# Patient Record
Sex: Male | Born: 1960 | Race: White | Hispanic: No | Marital: Married | Smoking: Former smoker
Health system: Southern US, Community
[De-identification: ages and names within clinical notes are randomized; demographics above are authoritative.]

## PROBLEM LIST (undated history)

## (undated) DIAGNOSIS — C20 Malignant neoplasm of rectum: Secondary | ICD-10-CM

## (undated) DIAGNOSIS — J309 Allergic rhinitis, unspecified: Secondary | ICD-10-CM

## (undated) DIAGNOSIS — R011 Cardiac murmur, unspecified: Secondary | ICD-10-CM

## (undated) HISTORY — DX: Cardiac murmur, unspecified: R01.1

## (undated) HISTORY — DX: Allergic rhinitis, unspecified: J30.9

## (undated) HISTORY — DX: Malignant neoplasm of rectum: C20

---

## 2019-11-29 ENCOUNTER — Telehealth: Payer: Self-pay | Admitting: Family Medicine

## 2019-11-29 ENCOUNTER — Other Ambulatory Visit: Payer: Self-pay

## 2019-11-29 ENCOUNTER — Encounter: Payer: Self-pay | Admitting: Family Medicine

## 2019-11-29 ENCOUNTER — Ambulatory Visit (INDEPENDENT_AMBULATORY_CARE_PROVIDER_SITE_OTHER): Payer: 59 | Admitting: Family Medicine

## 2019-11-29 VITALS — BP 134/83 | HR 73 | Temp 98.2°F | Ht 73.0 in | Wt 271.0 lb

## 2019-11-29 DIAGNOSIS — H0263 Xanthelasma of right eye, unspecified eyelid: Secondary | ICD-10-CM

## 2019-11-29 DIAGNOSIS — Z7689 Persons encountering health services in other specified circumstances: Secondary | ICD-10-CM

## 2019-11-29 DIAGNOSIS — R1030 Lower abdominal pain, unspecified: Secondary | ICD-10-CM

## 2019-11-29 DIAGNOSIS — J3089 Other allergic rhinitis: Secondary | ICD-10-CM

## 2019-11-29 DIAGNOSIS — H0266 Xanthelasma of left eye, unspecified eyelid: Secondary | ICD-10-CM

## 2019-11-29 MED ORDER — MONTELUKAST SODIUM 10 MG PO TABS
10.0000 mg | ORAL_TABLET | Freq: Every day | ORAL | 3 refills | Status: AC
Start: 2019-11-29 — End: ?

## 2019-11-29 MED ORDER — AZELASTINE HCL 0.1 % NA SOLN: 2.0000 | Freq: Two times a day (BID) | NASAL | 12 refills | Status: AC

## 2019-11-29 MED ORDER — DICYCLOMINE HCL 10 MG PO CAPS: 10.0000 mg | ORAL_CAPSULE | Freq: Three times a day (TID) | ORAL | 1 refills | Status: DC

## 2019-11-29 NOTE — Telephone Encounter (Signed)
Return call to patient and spoke to him. Patient stated that he just saw the Rx on my chart and he will go to pick them up from his pharmacy.

## 2019-11-29 NOTE — Telephone Encounter (Signed)
Copied from [place] [phone]. Topic: General - Inquiry >> Nov 29, 2019 11:27 AM Alease Frame wrote: Reason for CRM: patient was seen in the office today by Orene Desanctis. He wasn't prescribed any medication for GI and some other issues . He wanted a call back from lane's nurse . Please advise

## 2019-11-29 NOTE — Progress Notes (Signed)
BP 134/83   Pulse 73   Temp 98.2 F (36.8 C) (Oral)   Ht 6\' 1"  (1.854 m)   Wt 271 lb (122.9 kg)   SpO2 98%   BMI 35.75 kg/m    Subjective:    Patient ID: Stephen Vasquez, male    DOB: 12/10/1960, 59 y.o.   MRN: [id number]  HPI: Stephen Vasquez is a 59 y.o. male  Chief Complaint  Patient presents with  . Establish Care  . Gastroesophageal Reflux  . Sinusitis   Here today to establish care. Has not been to a doctor since about 1986 per patient. No known chronic medical problems and not currently taking any medications. Has several concerns.   Incredibly gassy and bloated, oily loose stools, bowel urgency during meals the past 2 years. Does occasionally have bloody stools but only happens when lifting something super heavy so thinks that's related. Has tried cutting out bread for a while and tried cutting back dairy both without any benefit. Denies frequent belching, upper GI burning/reflux sxs, unexpected weight loss, night sweats. Father had stomach cancer and he thinks paternal grandfather passed away from a related cancer.   Post nasal drainage, sinus pressure, hacking cough ongoing. Notes he struggles often with seasonal allergies. Clarinex is the only thing that worked well in the past but that it's very expensive so he doesn't use it anymore. States OTC flonase doesn't work nor do OTC antihistamines. .   Has changed diet quite a bit in the past few years after he was told the places coming in on eyes were xanthelasmas and he probably has high cholesterol. Has not had labs in many years to check on this or ever been on medication for it.   Relevant past medical, surgical, family and social history reviewed and updated as indicated. Interim medical history since our last visit reviewed. Allergies and medications reviewed and updated.  Review of Systems  Per HPI unless specifically indicated above     Objective:    BP 134/83   Pulse 73   Temp 98.2 F (36.8 C) (Oral)   Ht  6\' 1"  (1.854 m)   Wt 271 lb (122.9 kg)   SpO2 98%   BMI 35.75 kg/m   Wt Readings from Last 3 Encounters:  11/29/19 271 lb (122.9 kg)    Physical Exam Vitals and nursing note reviewed.  Constitutional:      Appearance: Normal appearance.  HENT:     Head: Atraumatic.     Right Ear: Tympanic membrane normal.     Left Ear: Tympanic membrane normal.     Nose:     Comments: Nasal mucosa erythematous and edematous b/l nares    Mouth/Throat:     Pharynx: Posterior oropharyngeal erythema present.  Eyes:     Extraocular Movements: Extraocular movements intact.     Conjunctiva/sclera: Conjunctivae normal.  Cardiovascular:     Rate and Rhythm: Normal rate and regular rhythm.  Pulmonary:     Effort: Pulmonary effort is normal.     Breath sounds: Normal breath sounds.  Abdominal:     General: Bowel sounds are normal. There is no distension.     Palpations: Abdomen is soft.     Tenderness: There is no abdominal tenderness. There is no guarding.  Musculoskeletal:        General: Normal range of motion.     Cervical back: Normal range of motion and neck supple.  Skin:    General: Skin is warm and dry.  Neurological:     General: No focal deficit present.     Mental Status: He is oriented to person, place, and time.  Psychiatric:        Mood and Affect: Mood normal.        Thought Content: Thought content normal.        Judgment: Judgment normal.     No results found for this or any previous visit.    Assessment & Plan:   Problem List Items Addressed This Visit      Respiratory   Allergic rhinitis    Will start singulair and astelin regimen as no benefit with antihistamines and flonase. May add antihistamine if desired. F/u if not improving. Supportive care reviewed       Other Visit Diagnoses    Lower abdominal pain    -  Primary   Referral placed to GI for eval and colonoscopy, trial bentyl and keeping food log in meantime to try to isolate triggers.    Relevant Orders     Ambulatory referral to Gastroenterology   Encounter to establish care       Xanthelasma of eyelid, bilateral       Will check full panel of labs at upcoming CPE. Continue good diet and exercise habits       Follow up plan: Return in about 2 weeks (around 12/13/2019) for CPE.

## 2019-11-30 DIAGNOSIS — J309 Allergic rhinitis, unspecified: Secondary | ICD-10-CM | POA: Insufficient documentation

## 2019-11-30 NOTE — Assessment & Plan Note (Signed)
Will start singulair and astelin regimen as no benefit with antihistamines and flonase. May add antihistamine if desired. F/u if not improving. Supportive care reviewed

## 2019-12-07 ENCOUNTER — Ambulatory Visit (INDEPENDENT_AMBULATORY_CARE_PROVIDER_SITE_OTHER): Payer: 59 | Admitting: Gastroenterology

## 2019-12-07 ENCOUNTER — Other Ambulatory Visit: Payer: Self-pay

## 2019-12-07 VITALS — BP 132/85 | HR 76 | Temp 98.1°F | Ht 73.0 in | Wt 274.4 lb

## 2019-12-07 DIAGNOSIS — K629 Disease of anus and rectum, unspecified: Secondary | ICD-10-CM

## 2019-12-07 DIAGNOSIS — R198 Other specified symptoms and signs involving the digestive system and abdomen: Secondary | ICD-10-CM

## 2019-12-07 DIAGNOSIS — K625 Hemorrhage of anus and rectum: Secondary | ICD-10-CM

## 2019-12-07 NOTE — Progress Notes (Addendum)
Stephen Bellows MD, MRCP(U.K) [address]  [place]  [place], [place] [address]  Main: [phone]  Fax: [phone]   Gastroenterology Consultation  Referring Provider:     Volney American,* Primary Care Physician:  Volney American, [place] Primary Gastroenterologist:  Dr. Jonathon Vasquez  Reason for Consultation:     Abdominal pain         HPI:   Stephen Vasquez is a 59 y.o. y/o male referred for consultation & management  by  Volney American, PA-C.    H/o  blood per rectum which is bright red in color at times even when not associated with the bowel movement.  Denies any perianal itching.  Some fecal seepage.  Never had a colonoscopy.  No hematochezia colon cancer or polyps.  Denies any NSAID use.  Denies any use of any blood thinners.  Denies any painful defecation.  Has a normal bowel movement daily.  Denies any constipation. No abdominal pain.    Past Medical History:  Diagnosis Date  . Heart murmur     No past surgical history on file.  Prior to Admission medications   Medication Sig Start Date End Date Taking? Authorizing Provider  azelastine (ASTELIN) 0.1 % nasal spray Place 2 sprays into both nostrils 2 (two) times daily. Use in each nostril as directed 11/29/19   Volney American, PA-C  dicyclomine (BENTYL) 10 MG capsule Take 1 capsule (10 mg total) by mouth 4 (four) times daily -  before meals and at bedtime. 11/29/19   Volney American, PA-C  montelukast (SINGULAIR) 10 MG tablet Take 1 tablet (10 mg total) by mouth at bedtime. 11/29/19   Volney American, PA-C    Family History  Problem Relation Age of Onset  . Hypertension Mother   . Hypertension Father   . Cancer Father   . Heart disease Brother      Social History   Tobacco Use  . Smoking status: Former Research scientist (life sciences)  . Smokeless tobacco: Never Used  Substance Use Topics  . Alcohol use: Not Currently  . Drug use: Never    Allergies as of 12/07/2019  . (No Known  Allergies)    Review of Systems:    All systems reviewed and negative except where noted in HPI.   Physical Exam:  There were no vitals taken for this visit. No LMP for male patient. Psych:  Alert and cooperative. Normal mood and affect. General:   Alert,  Well-developed, well-nourished, pleasant and cooperative in NAD Head:  Normocephalic and atraumatic. Eyes:  Sclera clear, no icterus.   Conjunctiva pink. Ears:  Normal auditory acuity. Lungs:  Respirations even and unlabored.  Clear throughout to auscultation.   No wheezes, crackles, or rhonchi. No acute distress. Heart:  Regular rate and rhythm; no murmurs, clicks, rubs, or gallops. Abdomen:  Normal bowel sounds.  No bruits.  Soft, non-tender and non-distended without masses, hepatosplenomegaly or hernias noted.  No guarding or rebound tenderness.    Neurologic:  Alert and oriented x3;  grossly normal neurologically. Psych:  Alert and cooperative. Normal mood and affect.  Imaging Studies: No results found.  Assessment and Plan:   Stephen Vasquez is a 62 y.o. y/o male has been referred for abdominal pain.  In fact his main issue is anal discomfort.  In addition has episodic rectal bleeding, seepage which can be seen with prolapsing hemorrhoids but is also associated with other conditions such as colonic polyps or neoplasm.  Plan  1. CBC,CMP  2.  Colonoscopy and rectal examination at the time of procedure.  Sample of bowel prep provided.  I have discussed alternative options, risks & benefits,  which include, but are not limited to, bleeding, infection, perforation,respiratory complication & drug reaction.  The patient agrees with this plan & written consent will be obtained.     Follow up in 8 weeks follow-up  Dr Stephen Bellows MD,MRCP(U.K)

## 2019-12-13 ENCOUNTER — Other Ambulatory Visit
Admission: RE | Admit: 2019-12-13 | Discharge: 2019-12-13 | Disposition: A | Discharge status: Home / Self Care | Payer: 59 | Source: Ambulatory Visit | Attending: Gastroenterology | Admitting: Gastroenterology

## 2019-12-13 DIAGNOSIS — Z20822 Contact with and (suspected) exposure to covid-19: Secondary | ICD-10-CM | POA: Insufficient documentation

## 2019-12-13 DIAGNOSIS — Z01812 Encounter for preprocedural laboratory examination: Secondary | ICD-10-CM | POA: Diagnosis present

## 2019-12-14 LAB — SARS CORONAVIRUS 2 (TAT 6-24 HRS): SARS Coronavirus 2: NEGATIVE

## 2019-12-15 ENCOUNTER — Ambulatory Visit: Payer: 59 | Admitting: Anesthesiology

## 2019-12-15 ENCOUNTER — Ambulatory Visit
Admission: RE | Admit: 2019-12-15 | Discharge: 2019-12-15 | Disposition: A | Discharge status: Home / Self Care | Payer: 59 | Attending: Gastroenterology | Admitting: Gastroenterology

## 2019-12-15 ENCOUNTER — Encounter: Payer: 59 | Admitting: Family Medicine

## 2019-12-15 ENCOUNTER — Encounter
Admission: RE | Disposition: A | Discharge status: Home / Self Care | Payer: Self-pay | Source: Home / Self Care | Attending: Gastroenterology

## 2019-12-15 ENCOUNTER — Encounter: Payer: Self-pay | Admitting: Gastroenterology

## 2019-12-15 DIAGNOSIS — C19 Malignant neoplasm of rectosigmoid junction: Secondary | ICD-10-CM | POA: Diagnosis not present

## 2019-12-15 DIAGNOSIS — K5669 Other partial intestinal obstruction: Secondary | ICD-10-CM | POA: Insufficient documentation

## 2019-12-15 DIAGNOSIS — K625 Hemorrhage of anus and rectum: Secondary | ICD-10-CM | POA: Insufficient documentation

## 2019-12-15 DIAGNOSIS — K6289 Other specified diseases of anus and rectum: Secondary | ICD-10-CM | POA: Diagnosis not present

## 2019-12-15 DIAGNOSIS — Z79899 Other long term (current) drug therapy: Secondary | ICD-10-CM | POA: Insufficient documentation

## 2019-12-15 DIAGNOSIS — Z87891 Personal history of nicotine dependence: Secondary | ICD-10-CM | POA: Diagnosis not present

## 2019-12-15 HISTORY — PX: COLONOSCOPY WITH PROPOFOL: SHX5780

## 2019-12-15 SURGERY — COLONOSCOPY WITH PROPOFOL
Anesthesia: General

## 2019-12-15 MED ORDER — PROPOFOL 500 MG/50ML IV EMUL
INTRAVENOUS | Status: DC | PRN
  Administered 2019-12-15: 150 ug/kg/min via INTRAVENOUS

## 2019-12-15 MED ORDER — LIDOCAINE HCL (CARDIAC) PF 100 MG/5ML IV SOSY
PREFILLED_SYRINGE | INTRAVENOUS | Status: DC | PRN
  Administered 2019-12-15: 50 mg via INTRAVENOUS

## 2019-12-15 MED ORDER — PROPOFOL 10 MG/ML IV BOLUS
INTRAVENOUS | Status: DC | PRN
  Administered 2019-12-15: 90 mg via INTRAVENOUS

## 2019-12-15 MED ORDER — PROPOFOL 500 MG/50ML IV EMUL
INTRAVENOUS | Status: AC
  Filled 2019-12-15: qty 50

## 2019-12-15 MED ORDER — SODIUM CHLORIDE 0.9 % IV SOLN
INTRAVENOUS | Status: DC
  Administered 2019-12-15: 1000 mL via INTRAVENOUS

## 2019-12-15 NOTE — Anesthesia Preprocedure Evaluation (Signed)
Anesthesia Evaluation  Patient identified by MRN, date of birth, ID band Patient awake    Reviewed: Allergy & Precautions, H&P , NPO status , Patient's Chart, lab work & pertinent test results  History of Anesthesia Complications Negative for: history of anesthetic complications  Airway Mallampati: II  TM Distance: >3 FB Neck ROM: full    Dental  (+) Chipped   Pulmonary neg pulmonary ROS, neg shortness of breath, former smoker,    Pulmonary exam normal        Cardiovascular Exercise Tolerance: Good (-) angina(-) Past MI and (-) DOE Normal cardiovascular exam+ Valvular Problems/Murmurs      Neuro/Psych negative neurological ROS  negative psych ROS   GI/Hepatic negative GI ROS, Neg liver ROS, neg GERD  ,  Endo/Other  negative endocrine ROS  Renal/GU negative Renal ROS  negative genitourinary   Musculoskeletal   Abdominal   Peds  Hematology negative hematology ROS (+)   Anesthesia Other Findings Past Medical History: No date: Heart murmur  History reviewed. No pertinent surgical history.  BMI    Body Mass Index: 35.18 kg/m      Reproductive/Obstetrics negative OB ROS                             Anesthesia Physical Anesthesia Plan  ASA: II  Anesthesia Plan: General   Post-op Pain Management:    Induction: Intravenous  PONV Risk Score and Plan: Propofol infusion and TIVA  Airway Management Planned: Natural Airway and Nasal Cannula  Additional Equipment:   Intra-op Plan:   Post-operative Plan:   Informed Consent: I have reviewed the patients History and Physical, chart, labs and discussed the procedure including the risks, benefits and alternatives for the proposed anesthesia with the patient or authorized representative who has indicated his/her understanding and acceptance.     Dental Advisory Given  Plan Discussed with: Anesthesiologist, CRNA and  Surgeon  Anesthesia Plan Comments: (Patient consented for risks of anesthesia including but not limited to:  - adverse reactions to medications - risk of intubation if required - damage to eyes, teeth, lips or other oral mucosa - nerve damage due to positioning  - sore throat or hoarseness - Damage to heart, brain, nerves, lungs or loss of life  Patient voiced understanding.)        Anesthesia Quick Evaluation

## 2019-12-15 NOTE — Op Note (Addendum)
[facility] Gastroenterology Patient Name: Stephen Vasquez Procedure Date: 12/15/2019 9:27 AM MRN: [id number] Account #: [id number] Date of Birth: February 03, 1961 Admit Type: Inpatient Age: 59 Room: [facility] ENDO ROOM 1 Gender: Male Note Status: Finalized Procedure:             Colonoscopy Indications:           Rectal bleeding Providers:             [name] MD, MD Medicines:             Monitored Anesthesia Care Complications:         No immediate complications. Procedure:             Pre-Anesthesia Assessment:                        - Prior to the procedure, a History and Physical was                         performed, and patient medications, allergies and                         sensitivities were reviewed. The patient's tolerance                         of previous anesthesia was reviewed.                        - The risks and benefits of the procedure and the                         sedation options and risks were discussed with the                         patient. All questions were answered and informed                         consent was obtained.                        - ASA Grade Assessment: II - A patient with mild                         systemic disease.                        After obtaining informed consent, the colonoscope was                         passed under direct vision. Throughout the procedure,                         the patient's blood pressure, pulse, and oxygen                         saturations were monitored continuously. The                         Colonoscope was introduced through the anus and  advanced to the the cecum, identified by the                         appendiceal orifice. The colonoscopy was performed                         with ease. The patient tolerated the procedure well.                         The quality of the bowel preparation was fair. Findings:      The digital rectal exam findings  include palpable rectal mass.      An ulcerated partially obstructing large mass was found in the rectum       extending all the way from the anal verge and then proximally( seen       clearly on retroflexed views - see pics). The mass was circumferential.       Oozing was present. This was biopsied with a cold forceps for histology.      The exam was otherwise without abnormality. Impression:            - Preparation of the colon was fair.                        - Palpable rectal mass found on digital rectal exam.                        - Rule out malignancy, partially obstructing tumor in                         the rectum. Biopsied.                        - The examination was otherwise normal. Recommendation:        - Discharge patient to home (with escort).                        - Resume previous diet.                        - Continue present medications.                        - Await pathology results.                        - Return to my office in 5 days. Procedure Code(s):     --- Professional ---                        [phone], Colonoscopy, flexible; with biopsy, single or                         multiple Diagnosis Code(s):     --- Professional ---                        K62.89, Other specified diseases of anus and rectum                        D49.0, Neoplasm of unspecified behavior of digestive  system                        K56.690, Other partial intestinal obstruction                        K62.5, Hemorrhage of anus and rectum CPT copyright 2019 American Medical Association. All rights reserved. The codes documented in this report are preliminary and upon coder review may  be revised to meet current compliance requirements. [name], MD [name] MD, MD 12/15/2019 2:08:13 PM This report has been signed electronically. Number of Addenda: 0 Note Initiated On: 12/15/2019 9:27 AM Scope Withdrawal Time: 0 hours 9 minutes 19 seconds  Total Procedure  Duration: 0 hours 14 minutes 2 seconds  Estimated Blood Loss:  Estimated blood loss: none.      [facility]

## 2019-12-15 NOTE — Anesthesia Procedure Notes (Signed)
Date/Time: 12/15/2019 1:40 PM Performed by: Johnna Acosta, CRNA Pre-anesthesia Checklist: Patient identified, Emergency Drugs available, Suction available, Patient being monitored and Timeout performed Patient Re-evaluated:Patient Re-evaluated prior to induction Oxygen Delivery Method: Nasal cannula Preoxygenation: Pre-oxygenation with 100% oxygen Induction Type: IV induction

## 2019-12-15 NOTE — H&P (Signed)
[name], MD [address] [place], [place], [place], [address] [address], [place], [place], [place], [address] Phone: [phone]  Fax: [phone]  Primary Care Physician:  Volney American, PA-C   Pre-Procedure History & Physical: HPI:  Stephen Vasquez is a 59 y.o. male is here for an colonoscopy.   Past Medical History:  Diagnosis Date  . Heart murmur     History reviewed. No pertinent surgical history.  Prior to Admission medications   Medication Sig Start Date End Date Taking? Authorizing Provider  azelastine (ASTELIN) 0.1 % nasal spray Place 2 sprays into both nostrils 2 (two) times daily. Use in each nostril as directed 11/29/19  Yes Volney American, PA-C  dicyclomine (BENTYL) 10 MG capsule Take 1 capsule (10 mg total) by mouth 4 (four) times daily -  before meals and at bedtime. 11/29/19  Yes Volney American, PA-C  montelukast (SINGULAIR) 10 MG tablet Take 1 tablet (10 mg total) by mouth at bedtime. 11/29/19  Yes Volney American, PA-C    Allergies as of 12/08/2019  . (No Known Allergies)    Family History  Problem Relation Age of Onset  . Hypertension Mother   . Hypertension Father   . Cancer Father   . Heart disease Brother     Social History   Socioeconomic History  . Marital status: Single    Spouse name: Not on file  . Number of children: Not on file  . Years of education: Not on file  . Highest education level: Not on file  Occupational History  . Not on file  Tobacco Use  . Smoking status: Former Research scientist (life sciences)  . Smokeless tobacco: Never Used  Substance and Sexual Activity  . Alcohol use: Not Currently  . Drug use: Never  . Sexual activity: Yes  Other Topics Concern  . Not on file  Social History Narrative  . Not on file   Social Determinants of Health   Financial Resource Strain:   . Difficulty of Paying Living Expenses:   Food Insecurity:   . Worried About Charity fundraiser in the Last Year:   . Arts development officer in the Last Year:   Transportation Needs:   . Film/video editor (Medical):   Marland Kitchen Lack of Transportation (Non-Medical):   Physical Activity:   . Days of Exercise per Week:   . Minutes of Exercise per Session:   Stress:   . Feeling of Stress :   Social Connections:   . Frequency of Communication with Friends and Family:   . Frequency of Social Gatherings with Friends and Family:   . Attends Religious Services:   . Active Member of Clubs or Organizations:   . Attends Archivist Meetings:   Marland Kitchen Marital Status:   Intimate Partner Violence:   . Fear of Current or Ex-Partner:   . Emotionally Abused:   Marland Kitchen Physically Abused:   . Sexually Abused:     Review of Systems: See HPI, otherwise negative ROS  Physical Exam: BP (!) 151/94   Pulse 77   Temp (!) 97.1 F (36.2 C) (Temporal)   Resp 17   Ht 6\' 2"  (1.88 m)   Wt 124.3 kg   SpO2 100%   BMI 35.18 kg/m  General:   Alert,  pleasant and cooperative in NAD Head:  Normocephalic and atraumatic. Neck:  Supple; no masses or thyromegaly. Lungs:  Clear throughout to auscultation, normal respiratory effort.  Heart:  +S1, +S2, Regular rate and rhythm, No edema. Abdomen:  Soft, nontender and nondistended. Normal bowel sounds, without guarding, and without rebound.   Neurologic:  Alert and  oriented x4;  grossly normal neurologically.  Impression/Plan: Stephen Vasquez is here for an colonoscopy to be performed for rectal bleeding Risks, benefits, limitations, and alternatives regarding  colonoscopy have been reviewed with the patient.  Questions have been answered.  All parties agreeable.   [name], MD  12/15/2019, 1:01 PM

## 2019-12-15 NOTE — Transfer of Care (Signed)
Immediate Anesthesia Transfer of Care Note  Patient: Stephen Vasquez  Procedure(s) Performed: COLONOSCOPY WITH PROPOFOL (N/A )  Patient Location: PACU  Anesthesia Type:General  Level of Consciousness: sedated  Airway & Oxygen Therapy: Patient Spontanous Breathing  Post-op Assessment: Report given to RN and Post -op Vital signs reviewed and stable  Post vital signs: Reviewed and stable  Last Vitals:  Vitals Value Taken Time  BP 100/69 12/15/19 1404  Temp 35.6 C 12/15/19 1402  Pulse 75 12/15/19 1404  Resp 22 12/15/19 1404  SpO2 97 % 12/15/19 1404    Last Pain:  Vitals:   12/15/19 1402  TempSrc: Temporal  PainSc: Asleep         Complications: No apparent anesthesia complications

## 2019-12-15 NOTE — Anesthesia Postprocedure Evaluation (Signed)
Anesthesia Post Note  Patient: Stephen Vasquez  Procedure(s) Performed: COLONOSCOPY WITH PROPOFOL (N/A )  Patient location during evaluation: Endoscopy Anesthesia Type: General Level of consciousness: awake and alert Pain management: pain level controlled Vital Signs Assessment: post-procedure vital signs reviewed and stable Respiratory status: spontaneous breathing, nonlabored ventilation, respiratory function stable and patient connected to nasal cannula oxygen Cardiovascular status: blood pressure returned to baseline and stable Postop Assessment: no apparent nausea or vomiting Anesthetic complications: no     Last Vitals:  Vitals:   12/15/19 1412 12/15/19 1422  BP: 123/73 120/67  Pulse:    Resp:    Temp:    SpO2:      Last Pain:  Vitals:   12/15/19 1422  TempSrc:   PainSc: 0-No pain                 Precious Haws Holliday Sheaffer

## 2019-12-16 ENCOUNTER — Other Ambulatory Visit: Payer: Self-pay

## 2019-12-16 ENCOUNTER — Ambulatory Visit (INDEPENDENT_AMBULATORY_CARE_PROVIDER_SITE_OTHER): Payer: 59 | Admitting: Gastroenterology

## 2019-12-16 ENCOUNTER — Encounter: Payer: Self-pay | Admitting: *Deleted

## 2019-12-16 ENCOUNTER — Other Ambulatory Visit: Payer: Self-pay | Admitting: Pathology

## 2019-12-16 VITALS — BP 138/77 | HR 69 | Temp 98.2°F | Ht 73.0 in | Wt 271.8 lb

## 2019-12-16 DIAGNOSIS — C20 Malignant neoplasm of rectum: Secondary | ICD-10-CM | POA: Diagnosis not present

## 2019-12-16 LAB — SURGICAL PATHOLOGY

## 2019-12-16 NOTE — Progress Notes (Signed)
   [name] MD, MRCP(U.K) [address]  [place]  [place], [place] [address]  Main: [phone]  Fax: [phone]   Primary Care Physician: Volney American, [place]  Primary Gastroenterologist:  Dr. [name]   Discuss biopsy results   HPI: Stephen Vasquez is a 59 y.o. male   Summary of history :  He was initially referred and seen on Dec 07, 2019 for abdominal pain whereas in fact he complained of blood per rectum associated with bowel movements as well as at times but not associated with bowel movements.  Also had fecal seepage.  Never had a prior colonoscopy.  Interval history   Dec 07, 2019-Dec 16, 2019  Dec 15, 2019: Colonoscopy:An ulcerated partially obstructing large mass was found in the rectum extending all the way from the anal verge and then proximally( seen clearly on retroflexed views - see pics). The mass was circumferential.  Oozing was present. The exam was otherwise without abnormality.  Biopsies demonstrated invasive colorectal adenocarcinoma, moderately differentiated.  I have brought him in here to discuss the results.  Current Outpatient Medications  Medication Sig Dispense Refill  . azelastine (ASTELIN) 0.1 % nasal spray Place 2 sprays into both nostrils 2 (two) times daily. Use in each nostril as directed 30 mL 12  . dicyclomine (BENTYL) 10 MG capsule Take 1 capsule (10 mg total) by mouth 4 (four) times daily -  before meals and at bedtime. 120 capsule 1  . montelukast (SINGULAIR) 10 MG tablet Take 1 tablet (10 mg total) by mouth at bedtime. 30 tablet 3   No current facility-administered medications for this visit.    Allergies as of 12/16/2019  . (No Known Allergies)    ROS:  General: Negative for anorexia, weight loss, fever, chills, fatigue, weakness. ENT: Negative for hoarseness, difficulty swallowing , nasal congestion. CV: Negative for chest pain, angina, palpitations, dyspnea on exertion, peripheral edema.  Respiratory: Negative  for dyspnea at rest, dyspnea on exertion, cough, sputum, wheezing.  GI: See history of present illness. GU:  Negative for dysuria, hematuria, urinary incontinence, urinary frequency, nocturnal urination.  Endo: Negative for unusual weight change.    Physical Examination:   There were no vitals taken for this visit.  General: Well-nourished, well-developed in no acute distress.  Eyes: No icterus. Conjunctivae pink. Psych: Alert and cooperative, normal mood and affect.   Imaging Studies: No results found.  Assessment and Plan:   Stephen Vasquez is a 84 y.o. y/o male is here today to discuss the results of his colonoscopy that he underwent yesterday.  Indication was bright red blood per rectum as well as fecal seepage.  Per rectal exam there was a mass palpable digitally in the distal rectum and endoscopically partially obstructing circumferential mass extending all the way to the anal canal.  Friable, bleeding on contact.  Biopsies demonstrate moderately differentiated adenocarcinoma.  Plan 1.  Refer to Dr. Rogue Bussing in oncology.  I have discussed in person and will order CT scan of the chest abdomen and pelvis for staging, CEA.  I have explained that the next steps will be staging of the cancer followed by discussion of chemoradiation therapy.  Likely appointment early next week    Dr [name]  MD,MRCP [facility]) Follow up as needed

## 2019-12-16 NOTE — Addendum Note (Signed)
Addended by: Dorethea Clan on: 12/16/2019 02:30 PM   Modules accepted: Orders

## 2019-12-16 NOTE — Addendum Note (Signed)
Addended by: Dorethea Clan on: 12/16/2019 01:37 PM   Modules accepted: Orders

## 2019-12-17 ENCOUNTER — Ambulatory Visit
Admission: RE | Admit: 2019-12-17 | Discharge: 2019-12-17 | Disposition: A | Discharge status: Home / Self Care | Payer: 59 | Source: Ambulatory Visit | Attending: Gastroenterology | Admitting: Gastroenterology

## 2019-12-17 ENCOUNTER — Encounter: Payer: Self-pay | Admitting: Internal Medicine

## 2019-12-17 ENCOUNTER — Other Ambulatory Visit: Payer: Self-pay

## 2019-12-17 DIAGNOSIS — C20 Malignant neoplasm of rectum: Secondary | ICD-10-CM | POA: Insufficient documentation

## 2019-12-17 LAB — CEA: CEA: 94.1 ng/mL — ABNORMAL HIGH (ref 0.0–4.7)

## 2019-12-17 MED ORDER — IOHEXOL 300 MG/ML  SOLN
100.0000 mL | Freq: Once | INTRAMUSCULAR | Status: AC | PRN
  Administered 2019-12-17: 100 mL via INTRAVENOUS

## 2019-12-20 ENCOUNTER — Encounter: Payer: 59 | Admitting: Family Medicine

## 2019-12-20 ENCOUNTER — Ambulatory Visit: Payer: 59

## 2019-12-20 ENCOUNTER — Inpatient Hospital Stay: Payer: 59 | Attending: Internal Medicine | Admitting: Internal Medicine

## 2019-12-20 ENCOUNTER — Encounter: Payer: Self-pay | Admitting: Internal Medicine

## 2019-12-20 ENCOUNTER — Other Ambulatory Visit: Payer: Self-pay

## 2019-12-20 DIAGNOSIS — Z800 Family history of malignant neoplasm of digestive organs: Secondary | ICD-10-CM

## 2019-12-20 DIAGNOSIS — D500 Iron deficiency anemia secondary to blood loss (chronic): Secondary | ICD-10-CM | POA: Diagnosis not present

## 2019-12-20 DIAGNOSIS — C20 Malignant neoplasm of rectum: Secondary | ICD-10-CM | POA: Diagnosis not present

## 2019-12-20 DIAGNOSIS — R599 Enlarged lymph nodes, unspecified: Secondary | ICD-10-CM | POA: Diagnosis not present

## 2019-12-20 DIAGNOSIS — K921 Melena: Secondary | ICD-10-CM | POA: Diagnosis not present

## 2019-12-20 DIAGNOSIS — Z87891 Personal history of nicotine dependence: Secondary | ICD-10-CM | POA: Insufficient documentation

## 2019-12-20 LAB — CBC WITH DIFFERENTIAL/PLATELET
Abs Immature Granulocytes: 0.03 10*3/uL (ref 0.00–0.07)
Basophils Absolute: 0.1 10*3/uL (ref 0.0–0.1)
Basophils Relative: 1 %
Eosinophils Absolute: 0.2 10*3/uL (ref 0.0–0.5)
Eosinophils Relative: 3 %
HCT: 35.3 % — ABNORMAL LOW (ref 39.0–52.0)
Hemoglobin: 10.8 g/dL — ABNORMAL LOW (ref 13.0–17.0)
Immature Granulocytes: 0 %
Lymphocytes Relative: 25 %
Lymphs Abs: 1.9 10*3/uL (ref 0.7–4.0)
MCH: 23.2 pg — ABNORMAL LOW (ref 26.0–34.0)
MCHC: 30.6 g/dL (ref 30.0–36.0)
MCV: 75.9 fL — ABNORMAL LOW (ref 80.0–100.0)
Monocytes Absolute: 0.5 10*3/uL (ref 0.1–1.0)
Monocytes Relative: 7 %
Neutro Abs: 4.9 10*3/uL (ref 1.7–7.7)
Neutrophils Relative %: 64 %
Platelets: 367 10*3/uL (ref 150–400)
RBC: 4.65 MIL/uL (ref 4.22–5.81)
RDW: 14.6 % (ref 11.5–15.5)
WBC: 7.7 10*3/uL (ref 4.0–10.5)
nRBC: 0 % (ref 0.0–0.2)

## 2019-12-20 LAB — COMPREHENSIVE METABOLIC PANEL
ALT: 13 U/L (ref 0–44)
AST: 15 U/L (ref 15–41)
Albumin: 3.7 g/dL (ref 3.5–5.0)
Alkaline Phosphatase: 83 U/L (ref 38–126)
Anion gap: 7 (ref 5–15)
BUN: 20 mg/dL (ref 6–20)
CO2: 28 mmol/L (ref 22–32)
Calcium: 9.1 mg/dL (ref 8.9–10.3)
Chloride: 107 mmol/L (ref 98–111)
Creatinine, Ser: 1.08 mg/dL (ref 0.61–1.24)
GFR calc Af Amer: >60 mL/min (ref >60)
GFR calc non Af Amer: >60 mL/min (ref >60)
Glucose, Bld: 103 mg/dL — ABNORMAL HIGH (ref 70–99)
Potassium: 4.4 mmol/L (ref 3.5–5.1)
Sodium: 142 mmol/L (ref 135–145)
Total Bilirubin: 0.4 mg/dL (ref 0.3–1.2)
Total Protein: 7.6 g/dL (ref 6.5–8.1)

## 2019-12-20 MED ORDER — ONDANSETRON HCL 8 MG PO TABS
ORAL_TABLET | ORAL | 1 refills | Status: DC
Start: 2019-12-20 — End: 2020-09-06

## 2019-12-20 MED ORDER — PROCHLORPERAZINE MALEATE 10 MG PO TABS
10.0000 mg | ORAL_TABLET | Freq: Four times a day (QID) | ORAL | 1 refills | Status: DC | PRN
Start: 2019-12-20 — End: 2020-09-06

## 2019-12-20 MED ORDER — LIDOCAINE-PRILOCAINE 2.5-2.5 % EX CREA
1.0000 | TOPICAL_CREAM | CUTANEOUS | 0 refills | Status: DC | PRN
Start: 2019-12-20 — End: 2020-05-08

## 2019-12-20 NOTE — Progress Notes (Signed)
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 CEA- 48. CT C/A/P- Large mass involving the rectum is identified compatible with primary rectal carcinoma. This has a large, 6.7 cm exophytic component in the left pelvis. Multiple perirectal lymph nodes are identified compatible with metastatic adenopathy; Borderline enlarged aortocaval node measures 0.9 cm  #   # SURVIVORSHIP:   # GENETICS:   DIAGNOSIS: RECTAL CA  STAGE:   III     ;  GOALS: cure  CURRENT/MOST RECENT THERAPY : FOLFOX   Rectal cancer ([place])  12/20/2019 Initial Diagnosis   Rectal cancer ([facility])      HISTORY OF PRESENTING ILLNESS:  Stephen Vasquez 59 y.o.  male with no significant past medical history has been referred to [place] for further evaluation recommendations for newly diagnosed rectal cancer.  Patient states that he had been having blood in stools on and off for the last 2 years.  Patient denies any stool incontinence.  Does admit to soft stool; however denies any diarrhea.  Denies any constipation.  Denies weight loss.  No nausea no vomiting abdominal pain.   Review of Systems  Constitutional: Negative for chills, diaphoresis, fever, malaise/fatigue and weight loss.  HENT: Negative for nosebleeds and sore throat.   Eyes: Negative for double vision.  Respiratory: Negative for cough, hemoptysis, sputum production, shortness of breath and wheezing.   Cardiovascular: Negative for chest pain, palpitations, orthopnea and leg swelling.  Gastrointestinal: Positive for blood in stool. Negative for abdominal pain, constipation, diarrhea, heartburn, melena, nausea and vomiting.   Genitourinary: Negative for dysuria, frequency and urgency.  Musculoskeletal: Positive for joint pain. Negative for back pain.  Skin: Negative.  Negative for itching and rash.  Neurological: Negative for dizziness, tingling, focal weakness, weakness and headaches.  Endo/Heme/Allergies: Does not bruise/bleed easily.  Psychiatric/Behavioral: Negative for depression. The patient is not nervous/anxious and does not have insomnia.      MEDICAL HISTORY:  Past Medical History:  Diagnosis Date  . Allergic rhinitis   . Heart murmur   . Rectal cancer [facility])     SURGICAL HISTORY: Past Surgical History:  Procedure Laterality Date  . COLONOSCOPY WITH PROPOFOL N/A 12/15/2019   Procedure: COLONOSCOPY WITH PROPOFOL;  Surgeon: [name], MD;  Location[facility] ENDOSCOPY;  Service: Gastroenterology;  Laterality: N/A;    SOCIAL HISTORY: Social History   Socioeconomic History  . Marital status: Single    Spouse name: Not on file  . Number of children: Not on file  . Years of education: Not on file  . Highest education level: Not on file  Occupational History  . Not on file  Tobacco Use  . Smoking status: Former Research scientist (life sciences)  . Smokeless tobacco: Never Used  Substance and Sexual Activity  . Alcohol use: Not Currently  . Drug use: Never  . Sexual activity: Yes  Other Topics Concern  . Not on file  Social History Narrative   Lives in [place]; Freight forwarder at [employer]; 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 2 children- in boy and girls in late 7s.    Social Determinants of Health   Financial Resource Strain:   . Difficulty of Paying Living Expenses:   Food Insecurity:   . Worried About Charity fundraiser in  the Last Year:   . [place] in the Last Year:   Transportation Needs:   . Film/video editor (Medical):   Marland Kitchen Lack of Transportation (Non-Medical):   Physical Activity:   . Days of Exercise per Week:   . Minutes of Exercise per Session:   Stress:   . Feeling of Stress :    Social Connections:   . Frequency of Communication with Friends and Family:   . Frequency of Social Gatherings with Friends and Family:   . Attends Religious Services:   . Active Member of Clubs or Organizations:   . Attends Archivist Meetings:   Marland Kitchen Marital Status:   Intimate Partner Violence:   . Fear of Current or Ex-Partner:   . Emotionally Abused:   Marland Kitchen Physically Abused:   . Sexually Abused:     FAMILY HISTORY: Family History  Problem Relation Age of Onset  . Hypertension Mother   . Hypertension Father   . Stomach cancer Father        in 18s- survived.   Marland Kitchen Heart disease Brother     ALLERGIES:  has No Known Allergies.  MEDICATIONS:  Current Outpatient Medications  Medication Sig Dispense Refill  . azelastine (ASTELIN) 0.1 % nasal spray Place 2 sprays into both nostrils 2 (two) times daily. Use in each nostril as directed 30 mL 12  . dicyclomine (BENTYL) 10 MG capsule Take 1 capsule (10 mg total) by mouth 4 (four) times daily -  before meals and at bedtime. 120 capsule 1  . montelukast (SINGULAIR) 10 MG tablet Take 1 tablet (10 mg total) by mouth at bedtime. 30 tablet 3   No current facility-administered medications for this visit.      Marland Kitchen  PHYSICAL EXAMINATION: ECOG PERFORMANCE STATUS: 1 - Symptomatic but completely ambulatory  Vitals:   12/20/19 0903  BP: 129/80  Pulse: (!) 58  Resp: 20  Temp: (!) 95.5 F (35.3 C)  SpO2: 100%   Filed Weights   12/20/19 0903  Weight: 272 lb 3.2 oz (123.5 kg)    Physical Exam  Constitutional: He is oriented to person, place, and time and well-developed, well-nourished, and in no distress.  HENT:  Head: Normocephalic and atraumatic.  Mouth/Throat: Oropharynx is clear and moist. No oropharyngeal exudate.  Eyes: Pupils are equal, round, and reactive to light.  Cardiovascular: Normal rate and regular rhythm.  Pulmonary/Chest: Effort normal and breath sounds normal. No respiratory distress. He has no wheezes.   Abdominal: Soft. Bowel sounds are normal. He exhibits no distension and no mass. There is no abdominal tenderness. There is no rebound and no guarding.  Musculoskeletal:        General: No tenderness or edema. Normal range of motion.     Cervical back: Normal range of motion and neck supple.  Neurological: He is alert and oriented to person, place, and time.  Skin: Skin is warm.  Psychiatric: Affect normal.     LABORATORY DATA:  I have reviewed the data as listed Lab Results  Component Value Date   WBC 7.7 12/20/2019   HGB 10.8 (L) 12/20/2019   HCT 35.3 (L) 12/20/2019   MCV 75.9 (L) 12/20/2019   PLT 367 12/20/2019   Recent Labs    12/20/19 0952  NA 142  K 4.4  CL 107  CO2 28  GLUCOSE 103*  BUN 20  CREATININE 1.08  CALCIUM 9.1  GFRNONAA >60  GFRAA >60  PROT 7.6  ALBUMIN 3.7  AST  15  ALT 13  ALKPHOS 83  BILITOT 0.4    RADIOGRAPHIC STUDIES: I have personally reviewed the radiological images as listed and agreed with the findings in the report. CT CHEST W CONTRAST  Result Date: 12/17/2019 CLINICAL DATA:  Colorectal carcinoma staging. EXAM: CT CHEST, ABDOMEN, AND PELVIS WITH CONTRAST TECHNIQUE: Multidetector CT imaging of the chest, abdomen and pelvis was performed following the standard protocol during bolus administration of intravenous contrast. CONTRAST:  197mL OMNIPAQUE IOHEXOL 300 MG/ML  SOLN COMPARISON:  None. FINDINGS: CT CHEST FINDINGS Cardiovascular: The heart size appears within normal limits. Aortic atherosclerosis. Lad, left circumflex coronary artery calcifications. Mediastinum/Nodes: No enlarged mediastinal, hilar, or axillary lymph nodes. Thyroid gland, trachea, and esophagus demonstrate no significant findings. Lungs/Pleura: There is no pleural effusion identified. No airspace consolidation, atelectasis, or pneumothorax. No suspicious lung nodules. Musculoskeletal: No chest wall mass or suspicious bone lesions identified. CT ABDOMEN PELVIS FINDINGS  Hepatobiliary: No focal liver abnormality is seen. No gallstones, gallbladder wall thickening, or biliary dilatation. Pancreas: Unremarkable. No pancreatic ductal dilatation or surrounding inflammatory changes. Spleen: Normal in size without focal abnormality. Adrenals/Urinary Tract: Normal appearance of the adrenal glands. The kidneys are unremarkable. No mass or hydronephrosis. Urinary bladder is unremarkable. Stomach/Bowel: Stomach is within normal limits. Appendix appears normal. No evidence of small bowel wall thickening, distention, or inflammatory changes. There is a large mass within the lower anatomic pelvis involving the rectum. This measures 8.8 x 8.7 by 10.3 cm. This has a large, 6.7 cm exophytic component in the left pelvis, image 121/2. Vascular/Lymphatic: Aortic atherosclerosis. Borderline enlarged aortocaval node measures 0.9 cm, image 84/2. No pathologic adenopathy noted within the abdomen however prominent left external iliac node measures 7 mm. Multiple perirectal lymph nodes are identified. The largest is in the left posterior pelvis measuring 1.1 cm, image 117/2. Reproductive: Prostate is unremarkable. Other: Small bilateral fat containing inguinal hernias. No ascites. No focal fluid collections Musculoskeletal: No acute or significant osseous findings. IMPRESSION: 1. Large mass involving the rectum is identified compatible with primary rectal carcinoma. This has a large, 6.7 cm exophytic component in the left pelvis. Multiple perirectal lymph nodes are identified compatible with metastatic adenopathy. 2. Borderline enlarged aortocaval node measures 0.9 cm. No pathologic adenopathy noted within the abdomen however. Multi vessel coronary artery calcifications noted. 3. Aortic atherosclerosis, in addition to lad and left circumflex coronary artery disease. Please note that although the presence of coronary artery calcium documents the presence of coronary artery disease, the severity of this  disease and any potential stenosis cannot be assessed on this non-gated CT examination. Assessment for potential risk factor modification, dietary therapy or pharmacologic therapy may be warranted, if clinically indicated. Aortic Atherosclerosis (ICD10-I70.0). Electronically Signed   By: Kerby Moors M.D.   On: 12/17/2019 17:47   CT Abdomen Pelvis W Contrast  Result Date: 12/17/2019 CLINICAL DATA:  Colorectal carcinoma staging. EXAM: CT CHEST, ABDOMEN, AND PELVIS WITH CONTRAST TECHNIQUE: Multidetector CT imaging of the chest, abdomen and pelvis was performed following the standard protocol during bolus administration of intravenous contrast. CONTRAST:  170mL OMNIPAQUE IOHEXOL 300 MG/ML  SOLN COMPARISON:  None. FINDINGS: CT CHEST FINDINGS Cardiovascular: The heart size appears within normal limits. Aortic atherosclerosis. Lad, left circumflex coronary artery calcifications. Mediastinum/Nodes: No enlarged mediastinal, hilar, or axillary lymph nodes. Thyroid gland, trachea, and esophagus demonstrate no significant findings. Lungs/Pleura: There is no pleural effusion identified. No airspace consolidation, atelectasis, or pneumothorax. No suspicious lung nodules. Musculoskeletal: No chest wall mass or suspicious bone lesions identified.  CT ABDOMEN PELVIS FINDINGS Hepatobiliary: No focal liver abnormality is seen. No gallstones, gallbladder wall thickening, or biliary dilatation. Pancreas: Unremarkable. No pancreatic ductal dilatation or surrounding inflammatory changes. Spleen: Normal in size without focal abnormality. Adrenals/Urinary Tract: Normal appearance of the adrenal glands. The kidneys are unremarkable. No mass or hydronephrosis. Urinary bladder is unremarkable. Stomach/Bowel: Stomach is within normal limits. Appendix appears normal. No evidence of small bowel wall thickening, distention, or inflammatory changes. There is a large mass within the lower anatomic pelvis involving the rectum. This measures 8.8  x 8.7 by 10.3 cm. This has a large, 6.7 cm exophytic component in the left pelvis, image 121/2. Vascular/Lymphatic: Aortic atherosclerosis. Borderline enlarged aortocaval node measures 0.9 cm, image 84/2. No pathologic adenopathy noted within the abdomen however prominent left external iliac node measures 7 mm. Multiple perirectal lymph nodes are identified. The largest is in the left posterior pelvis measuring 1.1 cm, image 117/2. Reproductive: Prostate is unremarkable. Other: Small bilateral fat containing inguinal hernias. No ascites. No focal fluid collections Musculoskeletal: No acute or significant osseous findings. IMPRESSION: 1. Large mass involving the rectum is identified compatible with primary rectal carcinoma. This has a large, 6.7 cm exophytic component in the left pelvis. Multiple perirectal lymph nodes are identified compatible with metastatic adenopathy. 2. Borderline enlarged aortocaval node measures 0.9 cm. No pathologic adenopathy noted within the abdomen however. Multi vessel coronary artery calcifications noted. 3. Aortic atherosclerosis, in addition to lad and left circumflex coronary artery disease. Please note that although the presence of coronary artery calcium documents the presence of coronary artery disease, the severity of this disease and any potential stenosis cannot be assessed on this non-gated CT examination. Assessment for potential risk factor modification, dietary therapy or pharmacologic therapy may be warranted, if clinically indicated. Aortic Atherosclerosis (ICD10-I70.0). Electronically Signed   By: Kerby Moors M.D.   On: 12/17/2019 17:47    ASSESSMENT & PLAN:   Rectal cancer ([place]) #Rectal adenocarcinoma stage III [multiple pelvic lymph nodes]; question borderline 9 mm retroperitoneal neuropathy.  Moderate differentiated.  Recommend PET scan/MRI for further evaluation.  Pretreatment CEA 94.  #Long discussion with patient and family regarding staging/pathology and  the treatment in detail.  Given the bulky disease in the rectum/low rectal disease I would recommend-total neoadjuvant therapy-with FOLFOX.  I discussed that FOLFOX chemotherapy is given every 2 weeks; discuss the potential side effects including but not limited to nausea vomiting diarrhea, sores in the mouth, hand-foot syndrome; also tingling and numbness/cold sensitivity with oxaliplatin.  #Patient need port placement; chemotherapy education.  Thank you Dr.Anna for allowing me to participate in the care of your pleasant patient. Please do not hesitate to contact me with questions or concerns in the interim.  Discussed with Dr. Tollie Pizza; plan for placement ASAP.  The plan of care was discussed with patient and his family in detail.  DISPOSITION: # labs today- cbc/cmp # chemo ed- ASAP # referral to Dr.Byrnett re: rectal cancer # MRI rectum-ASAP # PET scan ASAP  # Follow up TBD-Dr.B  Addendum: Hemoglobin 10.7 MCV 75; add iron studies ferritin; will plan Venofer.     All questions were answered. The patient knows to call the clinic with any problems, questions or concerns.       Cammie Sickle, MD 12/20/2019 12:48 PM

## 2019-12-20 NOTE — Assessment & Plan Note (Addendum)
#  Rectal adenocarcinoma stage III [multiple pelvic lymph nodes]; question borderline 9 mm retroperitoneal neuropathy.  Moderate differentiated.  Recommend PET scan/MRI for further evaluation.  Pretreatment CEA 94.  #Long discussion with patient and family regarding staging/pathology and the treatment in detail.  Given the bulky disease in the rectum/low rectal disease I would recommend-total neoadjuvant therapy-with FOLFOX.  I discussed that FOLFOX chemotherapy is given every 2 weeks; discuss the potential side effects including but not limited to nausea vomiting diarrhea, sores in the mouth, hand-foot syndrome; also tingling and numbness/cold sensitivity with oxaliplatin.  #Patient need port placement; chemotherapy education.  Thank you Dr.Anna for allowing me to participate in the care of your pleasant patient. Please do not hesitate to contact me with questions or concerns in the interim.  Discussed with Dr. Tollie Pizza; plan for placement ASAP.  The plan of care was discussed with patient and his family in detail.  DISPOSITION: # labs today- cbc/cmp # chemo ed- ASAP # referral to Dr.Byrnett re: rectal cancer # MRI rectum-ASAP # PET scan ASAP  # Follow up TBD-Dr.B  Addendum: Hemoglobin 10.7 MCV 75; add iron studies ferritin; will plan Venofer.

## 2019-12-20 NOTE — Progress Notes (Signed)
START ON PATHWAY REGIMEN - Colorectal     A cycle is every 14 days:     Oxaliplatin      Leucovorin      Fluorouracil      Fluorouracil   **Always confirm dose/schedule in your pharmacy ordering system**  Patient Characteristics: Preoperative or Nonsurgical Candidate (Clinical Staging), Rectal, cT3 - cT4, cN0 or Any cT, cN+ Tumor Location: Rectal Therapeutic Status: Preoperative or Nonsurgical Candidate (Clinical Staging) AJCC T Category: cTX AJCC N Category: cNX AJCC M Category: cM0 AJCC 8 Stage Grouping: IIIB Intent of Therapy: Curative Intent, Discussed with Patient

## 2019-12-21 ENCOUNTER — Other Ambulatory Visit: Payer: Self-pay | Admitting: General Surgery

## 2019-12-23 ENCOUNTER — Other Ambulatory Visit: Payer: Self-pay

## 2019-12-23 ENCOUNTER — Encounter
Admission: RE | Admit: 2019-12-23 | Discharge: 2019-12-23 | Disposition: A | Discharge status: Home / Self Care | Payer: 59 | Source: Ambulatory Visit | Attending: General Surgery | Admitting: General Surgery

## 2019-12-23 ENCOUNTER — Telehealth: Payer: Self-pay | Admitting: Internal Medicine

## 2019-12-23 ENCOUNTER — Other Ambulatory Visit: Payer: 59

## 2019-12-23 DIAGNOSIS — C20 Malignant neoplasm of rectum: Secondary | ICD-10-CM

## 2019-12-23 NOTE — Patient Instructions (Signed)
COVID TESTING Date: Monday Dec 27, 2019 Testing site:  [facility] [address] Thru Hours:  [id number] am - 1:00 pm Once you are tested, you are asked to stay quarantined (avoiding public places) until after your surgery.   Your procedure is scheduled on: Dec 29, 2019 WED Report to Day Surgery on the 2nd floor of the [facility]. To find out your arrival time, please call [phone] between 1PM - 3PM on: Dec 28, 2019  REMEMBER: Instructions that are not followed completely may result in serious medical risk, up to and including death; or upon the discretion of your surgeon and anesthesiologist your surgery may need to be rescheduled.  Do not eat food after midnight the night before surgery.  No gum chewing, lozengers or hard candies.  You may however, drink CLEAR liquids up to 2 hours before you are scheduled to arrive for your surgery. Do not drink anything within 2 hours of your scheduled arrival time.  Clear liquids include: - water  - apple juice without pulp - gatorade (not RED) - black coffee or tea (Do NOT add milk or creamers to the coffee or tea) Do NOT drink anything that is not on this list.  Type 1 and Type 2 diabetics should only drink water.  TAKE THESE MEDICATIONS THE MORNING OF SURGERY WITH A SIP OF WATER: NONE   USE NOSE SPRAY IF NEEDED DAY OF SURGERY.  Follow recommendations from Cardiologist, Pulmonologist or PCP regarding stopping Aspirin, Coumadin, Plavix, Eliquis, Pradaxa, or Pletal.  Stop Anti-inflammatories (NSAIDS) such as Advil, Aleve, Ibuprofen, Motrin, Naproxen, Naprosyn and Aspirin based products such as Excedrin, Goodys Powder, BC Powder. (May take Tylenol or Acetaminophen if needed.)  Stop ANY OVER THE COUNTER supplements until after surgery. (May continue Vitamin D, Vitamin B, and multivitamin.)  No Alcohol for 24 hours before or after surgery.  No Smoking including e-cigarettes for 24 hours prior to surgery.   No chewable tobacco products for at least 6 hours prior to surgery.  No nicotine patches on the day of surgery.  Do not use any "recreational" drugs for at least a week prior to your surgery.  Please be advised that the combination of cocaine and anesthesia may have negative outcomes, up to and including death. If you test positive for cocaine, your surgery will be cancelled.  On the morning of surgery brush your teeth with toothpaste and water, you may rinse your mouth with mouthwash if you wish. Do not swallow any toothpaste or mouthwash.  Do not wear jewelry, make-up, hairpins, clips or nail polish.  Do not wear lotions, powders, or perfumes NO AFTERSHAVE OR DEODORANT   Do not shave 48 hours prior to surgery.   Contact lenses, hearing aids and dentures may not be worn into surgery.  Do not bring valuables to the hospital. [facility] is not responsible for any missing/lost belongings or valuables.   SHOWER DAY OF SURGERY  Notify your doctor if there is any change in your medical condition (cold, fever, infection).  Wear comfortable clothing (specific to your surgery type) to the hospital.  Plan for stool softeners for home use; pain medications have a tendency to cause constipation. You can also help prevent constipation by eating foods high in fiber such as fruits and vegetables and drinking plenty of fluids as your diet allows.  After surgery, you can help prevent lung complications by doing breathing exercises.  Take deep breaths and cough every 1-2 hours. Your doctor may  order a device called an Incentive Spirometer to help you take deep breaths. When coughing or sneezing, hold a pillow firmly against your incision with both hands. This is called "splinting." Doing this helps protect your incision. It also decreases belly discomfort.  If you are being discharged the day of surgery, you will not be allowed to drive home. You will need a responsible adult (18 years or older)  to drive you home and stay with you that night.   If you are taking public transportation, you will need to have a responsible adult (18 years or older) with you. Please confirm with your physician that it is acceptable to use public transportation.   Please call the [facility] Dept. at [phone] if you have any questions about these instructions.  Visitation Policy:  Patients undergoing a surgery or procedure may have one family member or support person with them as long as that person is not COVID-19 positive or experiencing its symptoms.  That person may remain in the waiting area during the procedure.  Children under 84 years of age may have both parents or legal guardians with them during their hospital stay.   Inpatient Visitation Update:  Two designated support people may visit a patient during visiting hours 7 am to 8 pm. It must be the same two designated people for the duration of the patient stay. The visitors may come and go during the day, and there is no switching out to have different visitors. A mask must be worn at all times, including in the patient room.

## 2019-12-23 NOTE — Telephone Encounter (Signed)
C-please schedule appt- June 1st- MD; labs- cbc/cmp/CEA; FOLFOX; d-3 pump off.

## 2019-12-23 NOTE — Patient Instructions (Signed)
Oxaliplatin Injection What is this medicine? OXALIPLATIN (ox AL i PLA tin) is a chemotherapy drug. It targets fast dividing cells, like cancer cells, and causes these cells to die. This medicine is used to treat cancers of the colon and rectum, and many other cancers. This medicine may be used for other purposes; ask your health care provider or pharmacist if you have questions. COMMON BRAND NAME(S): Eloxatin What should I tell my health care provider before I take this medicine? They need to know if you have any of these conditions:  heart disease  history of irregular heartbeat  liver disease  low blood counts, like white cells, platelets, or red blood cells  lung or breathing disease, like asthma  take medicines that treat or prevent blood clots  tingling of the fingers or toes, or other nerve disorder  an unusual or allergic reaction to oxaliplatin, other chemotherapy, other medicines, foods, dyes, or preservatives  pregnant or trying to get pregnant  breast-feeding How should I use this medicine? This drug is given as an infusion into a vein. It is administered in a hospital or clinic by a specially trained health care professional. Talk to your pediatrician regarding the use of this medicine in children. Special care may be needed. Overdosage: If you think you have taken too much of this medicine contact a poison control center or emergency room at once. NOTE: This medicine is only for you. Do not share this medicine with others. What if I miss a dose? It is important not to miss a dose. Call your doctor or health care professional if you are unable to keep an appointment. What may interact with this medicine? Do not take this medicine with any of the following medications:  cisapride  dronedarone  pimozide  thioridazine This medicine may also interact with the following medications:  aspirin and aspirin-like medicines  certain medicines that treat or prevent  blood clots like warfarin, apixaban, dabigatran, and rivaroxaban  cisplatin  cyclosporine  diuretics  medicines for infection like acyclovir, adefovir, amphotericin B, bacitracin, cidofovir, foscarnet, ganciclovir, gentamicin, pentamidine, vancomycin  NSAIDs, medicines for pain and inflammation, like ibuprofen or naproxen  other medicines that prolong the QT interval (an abnormal heart rhythm)  pamidronate  zoledronic acid This list may not describe all possible interactions. Give your health care provider a list of all the medicines, herbs, non-prescription drugs, or dietary supplements you use. Also tell them if you smoke, drink alcohol, or use illegal drugs. Some items may interact with your medicine. What should I watch for while using this medicine? Your condition will be monitored carefully while you are receiving this medicine. You may need blood work done while you are taking this medicine. This medicine may make you feel generally unwell. This is not uncommon as chemotherapy can affect healthy cells as well as cancer cells. Report any side effects. Continue your course of treatment even though you feel ill unless your healthcare professional tells you to stop. This medicine can make you more sensitive to cold. Do not drink cold drinks or use ice. Cover exposed skin before coming in contact with cold temperatures or cold objects. When out in cold weather wear warm clothing and cover your mouth and nose to warm the air that goes into your lungs. Tell your doctor if you get sensitive to the cold. Do not become pregnant while taking this medicine or for 9 months after stopping it. Women should inform their health care professional if they wish to become   pregnant or think they might be pregnant. Men should not father a child while taking this medicine and for 6 months after stopping it. There is potential for serious side effects to an unborn child. Talk to your health care professional  for more information. Do not breast-feed a child while taking this medicine or for 3 months after stopping it. This medicine has caused ovarian failure in some women. This medicine may make it more difficult to get pregnant. Talk to your health care professional if you are concerned about your fertility. This medicine has caused decreased sperm counts in some men. This may make it more difficult to father a child. Talk to your health care professional if you are concerned about your fertility. This medicine may increase your risk of getting an infection. Call your health care professional for advice if you get a fever, chills, or sore throat, or other symptoms of a cold or flu. Do not treat yourself. Try to avoid being around people who are sick. Avoid taking medicines that contain aspirin, acetaminophen, ibuprofen, naproxen, or ketoprofen unless instructed by your health care professional. These medicines may hide a fever. Be careful brushing or flossing your teeth or using a toothpick because you may get an infection or bleed more easily. If you have any dental work done, tell your dentist you are receiving this medicine. What side effects may I notice from receiving this medicine? Side effects that you should report to your doctor or health care professional as soon as possible:  allergic reactions like skin rash, itching or hives, swelling of the face, lips, or tongue  breathing problems  cough  low blood counts - this medicine may decrease the number of white blood cells, red blood cells, and platelets. You may be at increased risk for infections and bleeding  nausea, vomiting  pain, redness, or irritation at site where injected  pain, tingling, numbness in the hands or feet  signs and symptoms of bleeding such as bloody or black, tarry stools; red or dark brown urine; spitting up blood or brown material that looks like coffee grounds; red spots on the skin; unusual bruising or bleeding  from the eyes, gums, or nose  signs and symptoms of a dangerous change in heartbeat or heart rhythm like chest pain; dizziness; fast, irregular heartbeat; palpitations; feeling faint or lightheaded; falls  signs and symptoms of infection like fever; chills; cough; sore throat; pain or trouble passing urine  signs and symptoms of liver injury like dark yellow or brown urine; general ill feeling or flu-like symptoms; light-colored stools; loss of appetite; nausea; right upper belly pain; unusually weak or tired; yellowing of the eyes or skin  signs and symptoms of low red blood cells or anemia such as unusually weak or tired; feeling faint or lightheaded; falls  signs and symptoms of muscle injury like dark urine; trouble passing urine or change in the amount of urine; unusually weak or tired; muscle pain; back pain Side effects that usually do not require medical attention (report to your doctor or health care professional if they continue or are bothersome):  changes in taste  diarrhea  gas  hair loss  loss of appetite  mouth sores This list may not describe all possible side effects. Call your doctor for medical advice about side effects. You may report side effects to FDA at 1-800-FDA-1088. Where should I keep my medicine? This drug is given in a hospital or clinic and will not be stored at home. NOTE:   This sheet is a summary. It may not cover all possible information. If you have questions about this medicine, talk to your doctor, pharmacist, or health care provider.  2020 Elsevier/Gold Standard (2018-12-09 12:20:35) Fluorouracil, 5-FU injection What is this medicine? FLUOROURACIL, 5-FU (flure oh YOOR a sil) is a chemotherapy drug. It slows the growth of cancer cells. This medicine is used to treat many types of cancer like breast cancer, colon or rectal cancer, pancreatic cancer, and stomach cancer. This medicine may be used for other purposes; ask your health care provider or  pharmacist if you have questions. COMMON BRAND NAME(S): Adrucil What should I tell my health care provider before I take this medicine? They need to know if you have any of these conditions:  blood disorders  dihydropyrimidine dehydrogenase (DPD) deficiency  infection (especially a virus infection such as chickenpox, cold sores, or herpes)  kidney disease  liver disease  malnourished, poor nutrition  recent or ongoing radiation therapy  an unusual or allergic reaction to fluorouracil, other chemotherapy, other medicines, foods, dyes, or preservatives  pregnant or trying to get pregnant  breast-feeding How should I use this medicine? This drug is given as an infusion or injection into a vein. It is administered in a hospital or clinic by a specially trained health care professional. Talk to your pediatrician regarding the use of this medicine in children. Special care may be needed. Overdosage: If you think you have taken too much of this medicine contact a poison control center or emergency room at once. NOTE: This medicine is only for you. Do not share this medicine with others. What if I miss a dose? It is important not to miss your dose. Call your doctor or health care professional if you are unable to keep an appointment. What may interact with this medicine?  allopurinol  cimetidine  dapsone  digoxin  hydroxyurea  leucovorin  levamisole  medicines for seizures like ethotoin, fosphenytoin, phenytoin  medicines to increase blood counts like filgrastim, pegfilgrastim, sargramostim  medicines that treat or prevent blood clots like warfarin, enoxaparin, and dalteparin  methotrexate  metronidazole  pyrimethamine  some other chemotherapy drugs like busulfan, cisplatin, estramustine, vinblastine  trimethoprim  trimetrexate  vaccines Talk to your doctor or health care professional before taking any of these  medicines:  acetaminophen  aspirin  ibuprofen  ketoprofen  naproxen This list may not describe all possible interactions. Give your health care provider a list of all the medicines, herbs, non-prescription drugs, or dietary supplements you use. Also tell them if you smoke, drink alcohol, or use illegal drugs. Some items may interact with your medicine. What should I watch for while using this medicine? Visit your doctor for checks on your progress. This drug may make you feel generally unwell. This is not uncommon, as chemotherapy can affect healthy cells as well as cancer cells. Report any side effects. Continue your course of treatment even though you feel ill unless your doctor tells you to stop. In some cases, you may be given additional medicines to help with side effects. Follow all directions for their use. Call your doctor or health care professional for advice if you get a fever, chills or sore throat, or other symptoms of a cold or flu. Do not treat yourself. This drug decreases your body's ability to fight infections. Try to avoid being around people who are sick. This medicine may increase your risk to bruise or bleed. Call your doctor or health care professional if you notice any   unusual bleeding. Be careful brushing and flossing your teeth or using a toothpick because you may get an infection or bleed more easily. If you have any dental work done, tell your dentist you are receiving this medicine. Avoid taking products that contain aspirin, acetaminophen, ibuprofen, naproxen, or ketoprofen unless instructed by your doctor. These medicines may hide a fever. Do not become pregnant while taking this medicine. Women should inform their doctor if they wish to become pregnant or think they might be pregnant. There is a potential for serious side effects to an unborn child. Talk to your health care professional or pharmacist for more information. Do not breast-feed an infant while taking  this medicine. Men should inform their doctor if they wish to father a child. This medicine may lower sperm counts. Do not treat diarrhea with over the counter products. Contact your doctor if you have diarrhea that lasts more than 2 days or if it is severe and watery. This medicine can make you more sensitive to the sun. Keep out of the sun. If you cannot avoid being in the sun, wear protective clothing and use sunscreen. Do not use sun lamps or tanning beds/booths. What side effects may I notice from receiving this medicine? Side effects that you should report to your doctor or health care professional as soon as possible:  allergic reactions like skin rash, itching or hives, swelling of the face, lips, or tongue  low blood counts - this medicine may decrease the number of white blood cells, red blood cells and platelets. You may be at increased risk for infections and bleeding.  signs of infection - fever or chills, cough, sore throat, pain or difficulty passing urine  signs of decreased platelets or bleeding - bruising, pinpoint red spots on the skin, black, tarry stools, blood in the urine  signs of decreased red blood cells - unusually weak or tired, fainting spells, lightheadedness  breathing problems  changes in vision  chest pain  mouth sores  nausea and vomiting  pain, swelling, redness at site where injected  pain, tingling, numbness in the hands or feet  redness, swelling, or sores on hands or feet  stomach pain  unusual bleeding Side effects that usually do not require medical attention (report to your doctor or health care professional if they continue or are bothersome):  changes in finger or toe nails  diarrhea  dry or itchy skin  hair loss  headache  loss of appetite  sensitivity of eyes to the light  stomach upset  unusually teary eyes This list may not describe all possible side effects. Call your doctor for medical advice about side effects.  You may report side effects to FDA at 1-800-FDA-1088. Where should I keep my medicine? This drug is given in a hospital or clinic and will not be stored at home. NOTE: This sheet is a summary. It may not cover all possible information. If you have questions about this medicine, talk to your doctor, pharmacist, or health care provider.  2020 Elsevier/Gold Standard (2007-11-25 13:53:16) Leucovorin injection What is this medicine? LEUCOVORIN (loo koe VOR in) is used to prevent or treat the harmful effects of some medicines. This medicine is used to treat anemia caused by a low amount of folic acid in the body. It is also used with 5-fluorouracil (5-FU) to treat colon cancer. This medicine may be used for other purposes; ask your health care provider or pharmacist if you have questions. What should I tell my health care   provider before I take this medicine? They need to know if you have any of these conditions:  anemia from low levels of vitamin B-12 in the blood  an unusual or allergic reaction to leucovorin, folic acid, other medicines, foods, dyes, or preservatives  pregnant or trying to get pregnant  breast-feeding How should I use this medicine? This medicine is for injection into a muscle or into a vein. It is given by a health care professional in a hospital or clinic setting. Talk to your pediatrician regarding the use of this medicine in children. Special care may be needed. Overdosage: If you think you have taken too much of this medicine contact a poison control center or emergency room at once. NOTE: This medicine is only for you. Do not share this medicine with others. What if I miss a dose? This does not apply. What may interact with this medicine?  capecitabine  fluorouracil  phenobarbital  phenytoin  primidone  trimethoprim-sulfamethoxazole This list may not describe all possible interactions. Give your health care provider a list of all the medicines, herbs,  non-prescription drugs, or dietary supplements you use. Also tell them if you smoke, drink alcohol, or use illegal drugs. Some items may interact with your medicine. What should I watch for while using this medicine? Your condition will be monitored carefully while you are receiving this medicine. This medicine may increase the side effects of 5-fluorouracil, 5-FU. Tell your doctor or health care professional if you have diarrhea or mouth sores that do not get better or that get worse. What side effects may I notice from receiving this medicine? Side effects that you should report to your doctor or health care professional as soon as possible:  allergic reactions like skin rash, itching or hives, swelling of the face, lips, or tongue  breathing problems  fever, infection  mouth sores  unusual bleeding or bruising  unusually weak or tired Side effects that usually do not require medical attention (report to your doctor or health care professional if they continue or are bothersome):  constipation or diarrhea  loss of appetite  nausea, vomiting This list may not describe all possible side effects. Call your doctor for medical advice about side effects. You may report side effects to FDA at 1-800-FDA-1088. Where should I keep my medicine? This drug is given in a hospital or clinic and will not be stored at home. NOTE: This sheet is a summary. It may not cover all possible information. If you have questions about this medicine, talk to your doctor, pharmacist, or health care provider.  2020 Elsevier/Gold Standard (2008-01-26 16:50:29)  

## 2019-12-23 NOTE — Addendum Note (Signed)
Addended by: Gloris Ham on: 12/23/2019 02:50 PM   Modules accepted: Orders

## 2019-12-23 NOTE — Progress Notes (Signed)
Tumor Board Documentation  Stephen Vasquez was presented by Dr Rogue Bussing at our Tumor Board on 12/23/2019, which included representatives from medical oncology, radiation oncology, navigation, pathology, radiology, surgical, internal medicine, pharmacy, pulmonology, palliative care, research.  Stephen Vasquez currently presents as a new patient, for [facility], for new positive pathology with history of the following treatments: active survellience, surgical intervention(s).  Additionally, we reviewed previous medical and familial history, history of present illness, and recent lab results along with all available histopathologic and imaging studies. The tumor board considered available treatment options and made the following recommendations: Chemotherapy, Concurrent chemo-radiation therapy, Additional screening(Chemo followed by Chemoradiation  Pt needs PET adn MRI)    The following procedures/referrals were also placed: No orders of the defined types were placed in this encounter.   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  Tumor board is a meeting of clinicians from various specialty areas who evaluate and discuss patients for whom a multidisciplinary approach is being considered. Final determinations in the plan of care are those of the provider(s). The responsibility for follow up of recommendations given during tumor board is that of the provider.   Today's extended care, comprehensive team conference, Stephen Vasquez was not present for the discussion and was not examined.   Multidisciplinary Tumor Board is a multidisciplinary case peer review process.  Decisions discussed in the Multidisciplinary Tumor Board reflect the opinions of the specialists present at the conference without having examined the patient.  Ultimately, treatment  and diagnostic decisions rest with the primary provider(s) and the patient.

## 2019-12-24 ENCOUNTER — Inpatient Hospital Stay (HOSPITAL_BASED_OUTPATIENT_CLINIC_OR_DEPARTMENT_OTHER): Payer: 59 | Admitting: Oncology

## 2019-12-24 ENCOUNTER — Inpatient Hospital Stay: Payer: 59

## 2019-12-24 DIAGNOSIS — C20 Malignant neoplasm of rectum: Secondary | ICD-10-CM | POA: Diagnosis not present

## 2019-12-24 NOTE — Progress Notes (Signed)
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 CEA- 71. CT C/A/P- Large mass involving the rectum is identified compatible with primary rectal carcinoma. This has a large, 6.7 cm exophytic component in the left pelvis. Multiple perirectal lymph nodes are identified compatible with metastatic adenopathy; Borderline enlarged aortocaval node measures 0.9 cm  #   # SURVIVORSHIP:   # GENETICS:   DIAGNOSIS: RECTAL CA  STAGE:   III     ;  GOALS: cure  CURRENT/MOST RECENT THERAPY : FOLFOX   Rectal cancer ([place])  12/20/2019 Initial Diagnosis   Rectal cancer ([place])   12/20/2019 -  Chemotherapy   The patient had PALONOSETRON HCL INJECTION 0.25 MG/5ML, 0.25 mg, Intravenous,  Once, 0 of 8 cycles leucovorin 1,008 mg in dextrose 5 % 250 mL infusion, 400 mg/m2, Intravenous,  Once, 0 of 8 cycles oxaliplatin (ELOXATIN) 215 mg in dextrose 5 % 500 mL chemo infusion, 85 mg/m2, Intravenous,  Once, 0 of 8 cycles fluorouracil (ADRUCIL) chemo injection 1,000 mg, 400 mg/m2, Intravenous,  Once, 0 of 8 cycles fluorouracil (ADRUCIL) 6,050 mg in sodium chloride 0.9 % 129 mL chemo infusion, 2,400 mg/m2, Intravenous, 1 Day/Dose, 0 of 8 cycles  for  chemotherapy treatment.      Interval history-Stephen Vasquez is a 59 year old male who presents to chemo care clinic today for initial meeting in preparation for starting chemotherapy. I introduced the chemo care clinic and we discussed that the role of the clinic is to assist those who are at an increased risk of emergency room visits and/or complications during the course of chemotherapy treatment. We discussed that the increased risk takes into account factors such as age, performance status, and co-morbidities. We also discussed that for some, this might include barriers to care such as not having a primary care provider, lack of insurance/transportation, or not being able to afford medications. We discussed that the goal of the program is to help prevent unplanned ER visits and help reduce complications during chemotherapy. We do this by discussing specific risk factors to each individual and identifying ways that we can help improve these risk factors and reduce barriers to care.   ECOG FS:0 - Asymptomatic  Review of systems- Review of Systems  Constitutional: Negative.  Negative for chills, fever, malaise/fatigue and weight loss.  HENT: Negative for congestion, ear pain and tinnitus.   Eyes: Negative.  Negative for blurred vision and double vision.  Respiratory: Negative.  Negative for cough, sputum production and shortness of breath.   Cardiovascular: Negative.  Negative for chest pain, palpitations and leg swelling.  Gastrointestinal: Negative.  Negative for abdominal pain, constipation, diarrhea, nausea and vomiting.  Genitourinary: Negative for dysuria, frequency and urgency.  Musculoskeletal: Negative for back pain and falls.  Skin: Negative.  Negative for rash.  Neurological: Negative.  Negative for weakness and headaches.  Endo/Heme/Allergies: Negative.  Does not bruise/bleed easily.  Psychiatric/Behavioral: Negative.  Negative for depression. The patient is not nervous/anxious and does  not have insomnia.      Current treatment- Plan for FOLFOX  No Known Allergies  Past Medical History:  Diagnosis Date  . Allergic rhinitis   . Heart murmur   . Rectal cancer [facility])     Past Surgical History:  Procedure Laterality Date  . COLONOSCOPY WITH PROPOFOL N/A 12/15/2019   Procedure: COLONOSCOPY WITH PROPOFOL;  Surgeon: [name], MD;  Location: [facility] ENDOSCOPY;  Service: Gastroenterology;  Laterality: N/A;    Social History   Socioeconomic History  . Marital status: Single    Spouse name: Not on file  . Number of children: Not on file  . Years of education: Not on file  . Highest education level: Not on file  Occupational History  . Not on file  Tobacco Use  . Smoking status: Former Research scientist (life sciences)  . Smokeless tobacco: Never Used  Substance and Sexual Activity  . Alcohol use: Not Currently  . Drug use: Never  . Sexual activity: Yes  Other Topics Concern  . Not on file  Social History Narrative   Lives in [place]; Freight forwarder at [employer]; 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 2 children- in boy and girls in late 56s.    Social Determinants of Health   Financial Resource Strain:   . Difficulty of Paying Living Expenses:   Food Insecurity:   . Worried About Charity fundraiser in the Last Year:   . Arboriculturist in the Last Year:   Transportation Needs:   . Film/video editor (Medical):   Marland Kitchen Lack of Transportation (Non-Medical):   Physical Activity:   . Days of Exercise per Week:   . Minutes of Exercise per Session:   Stress:   . Feeling of Stress :   Social Connections:   . Frequency of Communication with Friends and Family:   . Frequency of Social Gatherings with Friends and Family:   . Attends Religious Services:   . Active Member of Clubs or Organizations:   . Attends Archivist Meetings:   Marland Kitchen Marital Status:   Intimate Partner Violence:   . Fear of Current or Ex-Partner:   . Emotionally Abused:   Marland Kitchen Physically Abused:   . Sexually  Abused:     Family History  Problem Relation Age of Onset  . Hypertension Mother   . Hypertension Father   . Stomach cancer Father        in 105s- survived.   Marland Kitchen Heart disease Brother      Current Outpatient Medications:  .  azelastine (ASTELIN) 0.1 % nasal spray, Place 2 sprays into both nostrils 2 (two) times daily. Use in each nostril as directed, Disp: 30 mL, Rfl: 12 .  dicyclomine (BENTYL) 10 MG capsule, Take 1 capsule (10 mg total) by mouth 4 (four) times daily -  before meals and at bedtime., Disp: 120 capsule, Rfl: 1 .  lidocaine-prilocaine (EMLA) cream, Apply 1 application topically as needed. (Patient taking differently: Apply 1 application topically as needed (port access). ), Disp: 30 g, Rfl: 0 .  montelukast (SINGULAIR) 10 MG tablet, Take 1 tablet (10 mg total) by mouth at bedtime., Disp: 30 tablet, Rfl: 3 .  naproxen sodium (ALEVE) 220 MG tablet, Take 220 mg by mouth 2 (two) times daily as needed (pain)., Disp: , Rfl:  .  ondansetron (ZOFRAN) 8 MG tablet, One pill every 8 hours as needed for nausea/vomitting. (  Patient taking differently: Take 8 mg by mouth every 8 (eight) hours as needed for nausea or vomiting. ), Disp: 40 tablet, Rfl: 1 .  oxymetazoline (AFRIN) 0.05 % nasal spray, Place 1 spray into both nostrils 2 (two) times daily as needed for congestion., Disp: , Rfl:  .  Probiotic CAPS, Take 1 capsule by mouth daily., Disp: , Rfl:  .  prochlorperazine (COMPAZINE) 10 MG tablet, Take 1 tablet (10 mg total) by mouth every 6 (six) hours as needed for nausea or vomiting., Disp: 40 tablet, Rfl: 1  Physical exam: There were no vitals filed for this visit. Physical Exam Constitutional:      Appearance: Normal appearance.  HENT:     Head: Normocephalic and atraumatic.  Eyes:     Pupils: Pupils are equal, round, and reactive to light.  Cardiovascular:     Rate and Rhythm: Normal rate and regular rhythm.     Heart sounds: Normal heart sounds. No murmur.  Pulmonary:      Effort: Pulmonary effort is normal.     Breath sounds: Normal breath sounds. No wheezing.  Abdominal:     General: Bowel sounds are normal. There is no distension.     Palpations: Abdomen is soft.     Tenderness: There is no abdominal tenderness.  Musculoskeletal:        General: Normal range of motion.     Cervical back: Normal range of motion.  Skin:    General: Skin is warm and dry.     Findings: No rash.  Neurological:     Mental Status: He is alert and oriented to person, place, and time.  Psychiatric:        Judgment: Judgment normal.      CMP Latest Ref Rng & Units 12/20/2019  Glucose 70 - 99 mg/dL 103(H)  BUN 6 - 20 mg/dL 20  Creatinine 0.61 - 1.24 mg/dL 1.08  Sodium 135 - 145 mmol/L 142  Potassium 3.5 - 5.1 mmol/L 4.4  Chloride 98 - 111 mmol/L 107  CO2 22 - 32 mmol/L 28  Calcium 8.9 - 10.3 mg/dL 9.1  Total Protein 6.5 - 8.1 g/dL 7.6  Total Bilirubin 0.3 - 1.2 mg/dL 0.4  Alkaline Phos 38 - 126 U/L 83  AST 15 - 41 U/L 15  ALT 0 - 44 U/L 13   CBC Latest Ref Rng & Units 12/20/2019  WBC 4.0 - 10.5 K/uL 7.7  Hemoglobin 13.0 - 17.0 g/dL 10.8(L)  Hematocrit 39.0 - 52.0 % 35.3(L)  Platelets 150 - 400 K/uL 367    No images are attached to the encounter.  CT CHEST W CONTRAST  Result Date: 12/17/2019 CLINICAL DATA:  Colorectal carcinoma staging. EXAM: CT CHEST, ABDOMEN, AND PELVIS WITH CONTRAST TECHNIQUE: Multidetector CT imaging of the chest, abdomen and pelvis was performed following the standard protocol during bolus administration of intravenous contrast. CONTRAST:  160m OMNIPAQUE IOHEXOL 300 MG/ML  SOLN COMPARISON:  None. FINDINGS: CT CHEST FINDINGS Cardiovascular: The heart size appears within normal limits. Aortic atherosclerosis. Lad, left circumflex coronary artery calcifications. Mediastinum/Nodes: No enlarged mediastinal, hilar, or axillary lymph nodes. Thyroid gland, trachea, and esophagus demonstrate no significant findings. Lungs/Pleura: There is no pleural  effusion identified. No airspace consolidation, atelectasis, or pneumothorax. No suspicious lung nodules. Musculoskeletal: No chest wall mass or suspicious bone lesions identified. CT ABDOMEN PELVIS FINDINGS Hepatobiliary: No focal liver abnormality is seen. No gallstones, gallbladder wall thickening, or biliary dilatation. Pancreas: Unremarkable. No pancreatic ductal dilatation or surrounding inflammatory changes. Spleen:  Normal in size without focal abnormality. Adrenals/Urinary Tract: Normal appearance of the adrenal glands. The kidneys are unremarkable. No mass or hydronephrosis. Urinary bladder is unremarkable. Stomach/Bowel: Stomach is within normal limits. Appendix appears normal. No evidence of small bowel wall thickening, distention, or inflammatory changes. There is a large mass within the lower anatomic pelvis involving the rectum. This measures 8.8 x 8.7 by 10.3 cm. This has a large, 6.7 cm exophytic component in the left pelvis, image 121/2. Vascular/Lymphatic: Aortic atherosclerosis. Borderline enlarged aortocaval node measures 0.9 cm, image 84/2. No pathologic adenopathy noted within the abdomen however prominent left external iliac node measures 7 mm. Multiple perirectal lymph nodes are identified. The largest is in the left posterior pelvis measuring 1.1 cm, image 117/2. Reproductive: Prostate is unremarkable. Other: Small bilateral fat containing inguinal hernias. No ascites. No focal fluid collections Musculoskeletal: No acute or significant osseous findings. IMPRESSION: 1. Large mass involving the rectum is identified compatible with primary rectal carcinoma. This has a large, 6.7 cm exophytic component in the left pelvis. Multiple perirectal lymph nodes are identified compatible with metastatic adenopathy. 2. Borderline enlarged aortocaval node measures 0.9 cm. No pathologic adenopathy noted within the abdomen however. Multi vessel coronary artery calcifications noted. 3. Aortic atherosclerosis,  in addition to lad and left circumflex coronary artery disease. Please note that although the presence of coronary artery calcium documents the presence of coronary artery disease, the severity of this disease and any potential stenosis cannot be assessed on this non-gated CT examination. Assessment for potential risk factor modification, dietary therapy or pharmacologic therapy may be warranted, if clinically indicated. Aortic Atherosclerosis (ICD10-I70.0). Electronically Signed   By: Kerby Moors M.D.   On: 12/17/2019 17:47   CT Abdomen Pelvis W Contrast  Result Date: 12/17/2019 CLINICAL DATA:  Colorectal carcinoma staging. EXAM: CT CHEST, ABDOMEN, AND PELVIS WITH CONTRAST TECHNIQUE: Multidetector CT imaging of the chest, abdomen and pelvis was performed following the standard protocol during bolus administration of intravenous contrast. CONTRAST:  176m OMNIPAQUE IOHEXOL 300 MG/ML  SOLN COMPARISON:  None. FINDINGS: CT CHEST FINDINGS Cardiovascular: The heart size appears within normal limits. Aortic atherosclerosis. Lad, left circumflex coronary artery calcifications. Mediastinum/Nodes: No enlarged mediastinal, hilar, or axillary lymph nodes. Thyroid gland, trachea, and esophagus demonstrate no significant findings. Lungs/Pleura: There is no pleural effusion identified. No airspace consolidation, atelectasis, or pneumothorax. No suspicious lung nodules. Musculoskeletal: No chest wall mass or suspicious bone lesions identified. CT ABDOMEN PELVIS FINDINGS Hepatobiliary: No focal liver abnormality is seen. No gallstones, gallbladder wall thickening, or biliary dilatation. Pancreas: Unremarkable. No pancreatic ductal dilatation or surrounding inflammatory changes. Spleen: Normal in size without focal abnormality. Adrenals/Urinary Tract: Normal appearance of the adrenal glands. The kidneys are unremarkable. No mass or hydronephrosis. Urinary bladder is unremarkable. Stomach/Bowel: Stomach is within normal  limits. Appendix appears normal. No evidence of small bowel wall thickening, distention, or inflammatory changes. There is a large mass within the lower anatomic pelvis involving the rectum. This measures 8.8 x 8.7 by 10.3 cm. This has a large, 6.7 cm exophytic component in the left pelvis, image 121/2. Vascular/Lymphatic: Aortic atherosclerosis. Borderline enlarged aortocaval node measures 0.9 cm, image 84/2. No pathologic adenopathy noted within the abdomen however prominent left external iliac node measures 7 mm. Multiple perirectal lymph nodes are identified. The largest is in the left posterior pelvis measuring 1.1 cm, image 117/2. Reproductive: Prostate is unremarkable. Other: Small bilateral fat containing inguinal hernias. No ascites. No focal fluid collections Musculoskeletal: No acute or significant osseous findings. IMPRESSION:  1. Large mass involving the rectum is identified compatible with primary rectal carcinoma. This has a large, 6.7 cm exophytic component in the left pelvis. Multiple perirectal lymph nodes are identified compatible with metastatic adenopathy. 2. Borderline enlarged aortocaval node measures 0.9 cm. No pathologic adenopathy noted within the abdomen however. Multi vessel coronary artery calcifications noted. 3. Aortic atherosclerosis, in addition to lad and left circumflex coronary artery disease. Please note that although the presence of coronary artery calcium documents the presence of coronary artery disease, the severity of this disease and any potential stenosis cannot be assessed on this non-gated CT examination. Assessment for potential risk factor modification, dietary therapy or pharmacologic therapy may be warranted, if clinically indicated. Aortic Atherosclerosis (ICD10-I70.0). Electronically Signed   By: Kerby Moors M.D.   On: 12/17/2019 17:47     Assessment and plan- Patient is a 59 y.o. male who presents to [facility] for initial meeting in preparation for  starting chemotherapy for the treatment of rectal cancer.   1. HPI: Mr. Ordway is a 58 year old male with past medical history significant for allergic rhinitis who was recently diagnosed with rectal cancer by Dr. Vicente Males.  He initially presented with abdominal pain and bloody stools along with fecal seepage.  Colonoscopy revealed an ulcerated partially obstructing large mass in the rectum.  Biopsy demonstrated invasive colorectal adenocarcinoma.  He met with Dr. Rogue Bussing on 12/20/2019 and plan is to begin FOLFOX as soon as possible.  He will have a PET scan completed on 12/27/2019 and begin treatment on 01/04/2020 with lab work.  2. Chemo Care Clinic/High Risk for ER/Hospitalization during chemotherapy- We discussed the role of the chemo care clinic and identified patient specific risk factors. I discussed that patient was identified as high risk primarily based on: Stage of disease.  Patient has past medical history positive for: Past Medical History:  Diagnosis Date  . Allergic rhinitis   . Heart murmur   . Rectal cancer [facility])     Patient has past surgical history positive for: Past Surgical History:  Procedure Laterality Date  . COLONOSCOPY WITH PROPOFOL N/A 12/15/2019   Procedure: COLONOSCOPY WITH PROPOFOL;  Surgeon: [name], MD;  Location: [facility] ENDOSCOPY;  Service: Gastroenterology;  Laterality: N/A;    Based on our high risk symptom management report; this patient has a high risk of ED utilization.  The percentage below indicates how "at risk "  this patient based on the factors in this table within one year.   General Risk Score: 1  Values used to calculate this score:   Points  Metrics      0        Age: 73      1        Hospital Admissions: 1      0        ED Visits: 0      0        Has Chronic Obstructive Pulmonary Disease: No      0        Has Diabetes: No      0        Has Congestive Heart Failure: No      0        Has liver disease: No      0        Has Depression: No       0        Current PCP: Volney American, PA-C  0        Has Medicaid: No  3. We discussed that social determinants of health may have significant impacts on health and outcomes for cancer patients.  Today we discussed specific social determinants of performance status, alcohol use, depression, financial needs, food insecurity, housing, interpersonal violence, social connections, stress, tobacco use, and transportation.    After lengthy discussion the following were identified as areas of need:   Mr. Bodkin has questions about his critical care able he dropped off at our clinic this past week.  He is asked that I reach out to [facility], Dr. Aletha Halim RN to see when this might take place.  He also would like to be in touch with [facility] to identify additional resources that may be available for him.  Outpatient services: We discussed options including home based and outpatient services, DME and care program. We discusssed that patients who participate in regular physical activity report fewer negative impacts of cancer and treatments and report less fatigue.   Financial Concerns: We discussed that living with cancer can create tremendous financial burden.  We discussed options for assistance. I asked that if assistance is needed in affording medications or paying bills to please let us know so that we can provide assistance. We discussed options for food including social services, [employer] ($50 every 2 weeks) and onsite food pantry.  We will also notify [facility] to see if cancer center can provide additional support.  Referral to Social work: Introduced Education officer, museum Elease Etienne and the services he can provide such as support with [employer], cell phone and gas vouchers.   Support groups: We discussed options for support groups at the cancer center. If interested, please notify nurse navigator to enroll. We discussed options for managing stress including healthy eating,  exercise as well as participating in no charge counseling services at the cancer center and support groups.  If these are of interest, patient can notify either myself or primary nursing team.We discussed options for management including medications and referral to quit Smart program  Transportation: We discussed options for transportation including acta, paratransit, bus routes, link transit, taxi/uber/lyft, and cancer center [place].  I have notified primary oncology team who will help assist with arranging Lucianne Lei transportation for appointments when/if needed. We also discussed options for transportation on short notice/acute visits.  Palliative care services: We have palliative care services available in the cancer center to discuss goals of care and advanced care planning.  Please let us know if you have any questions or would like to speak to our palliative nurse practitioner.  Symptom Management Clinic: We discussed our symptom management clinic which is available for acute concerns while receiving treatment such as nausea, vomiting or diarrhea.  We can be reached via telephone at [phone] or through my chart.  We are available for virtual or in person visits on the same day from 830 to 4 PM Monday through Friday. He denies needing specific assistance at this time and He will be followed by Dr. Rogue Bussing clinical team.  Plan: Discussed symptom management clinic. Discussed palliative care services. Discussed resources that are available here at the cancer center. Discussed medications and new prescriptions to begin treatment such as anti-nausea or steroids.   Disposition: RTC on 12/27/2019 for PET scan. RTC on 01/01/2020 for MRI of his abdomen. RTC on 01/04/2020 for lab work, MD assessment and cycle 1 of FOLFOX. Have touched base with Nira Conn, RN to discuss paperwork for his critical care  benefits.  States she is awaiting Dr. Aram Candela signature.   Visit Diagnosis 1. Rectal cancer [facility])      Patient expressed understanding and was in agreement with this plan. He also understands that He can call clinic at any time with any questions, concerns, or complaints.   Greater than 50% was spent in counseling and coordination of care with this patient including but not limited to discussion of the relevant topics above (See A&P) including, but not limited to diagnosis and management of acute and chronic medical conditions.   [place] [facility] [place]  CC: Dr. Rogue Bussing

## 2019-12-27 ENCOUNTER — Other Ambulatory Visit
Admission: RE | Admit: 2019-12-27 | Discharge: 2019-12-27 | Disposition: A | Discharge status: Home / Self Care | Payer: 59 | Source: Ambulatory Visit | Attending: General Surgery | Admitting: General Surgery

## 2019-12-27 ENCOUNTER — Encounter
Admission: RE | Admit: 2019-12-27 | Discharge: 2019-12-27 | Disposition: A | Discharge status: Home / Self Care | Payer: 59 | Source: Ambulatory Visit | Attending: Internal Medicine | Admitting: Internal Medicine

## 2019-12-27 ENCOUNTER — Other Ambulatory Visit: Payer: Self-pay

## 2019-12-27 DIAGNOSIS — Z01812 Encounter for preprocedural laboratory examination: Secondary | ICD-10-CM | POA: Insufficient documentation

## 2019-12-27 DIAGNOSIS — C20 Malignant neoplasm of rectum: Secondary | ICD-10-CM | POA: Insufficient documentation

## 2019-12-27 DIAGNOSIS — R591 Generalized enlarged lymph nodes: Secondary | ICD-10-CM | POA: Diagnosis not present

## 2019-12-27 DIAGNOSIS — Z20822 Contact with and (suspected) exposure to covid-19: Secondary | ICD-10-CM | POA: Insufficient documentation

## 2019-12-27 LAB — GLUCOSE, CAPILLARY: Glucose-Capillary: 89 mg/dL (ref 70–99)

## 2019-12-27 LAB — SARS CORONAVIRUS 2 (TAT 6-24 HRS): SARS Coronavirus 2: NEGATIVE

## 2019-12-27 MED ORDER — FLUDEOXYGLUCOSE F - 18 (FDG) INJECTION: 14.1000 | Freq: Once | INTRAVENOUS | Status: DC | PRN

## 2019-12-28 NOTE — Progress Notes (Signed)
Pharmacist Chemotherapy Monitoring - Initial Assessment    Anticipated start date: 01/04/20   Regimen:  . Are orders appropriate based on the patient's diagnosis, regimen, and cycle? Yes . Does the plan date match the patient's scheduled date? Yes . Is the sequencing of drugs appropriate? Yes . Are the premedications appropriate for the patient's regimen? Yes . Prior Authorization for treatment is: Approved o If applicable, is the correct biosimilar selected based on the patient's insurance? not applicable  Organ Function and Labs: Marland Kitchen Are dose adjustments needed based on the patient's renal function, hepatic function, or hematologic function? No . Are appropriate labs ordered prior to the start of patient's treatment? Yes . Other organ system assessment, if indicated: N/A . The following baseline labs, if indicated, have been ordered: N/A  Dose Assessment: . Are the drug doses appropriate? Yes . Are the following correct: o Drug concentrations Yes o IV fluid compatible with drug Yes o Administration routes Yes o Timing of therapy Yes . If applicable, does the patient have documented access for treatment and/or plans for port-a-cath placement? not applicable . If applicable, have lifetime cumulative doses been properly documented and assessed? not applicable Lifetime Dose Tracking  No doses have been documented on this patient for the following tracked chemicals: Doxorubicin, Epirubicin, Idarubicin, Daunorubicin, Mitoxantrone, Bleomycin, Oxaliplatin, Carboplatin, Liposomal Doxorubicin  o   Toxicity Monitoring/Prevention: . The patient has the following take home antiemetics prescribed: Ondansetron, Prochlorperazine, Dexamethasone and Lorazepam . The patient has the following take home medications prescribed: N/A . Medication allergies and previous infusion related reactions, if applicable, have been reviewed and addressed. Yes . The patient's current medication list has been assessed  for drug-drug interactions with their chemotherapy regimen. no significant drug-drug interactions were identified on review.  Order Review: . Are the treatment plan orders signed? No . Is the patient scheduled to see a provider prior to their treatment? Yes  I verify that I have reviewed each item in the above checklist and answered each question accordingly.  Sharni Negron K 12/28/2019 9:11 AM

## 2019-12-29 ENCOUNTER — Other Ambulatory Visit: Payer: Self-pay

## 2019-12-29 ENCOUNTER — Ambulatory Visit: Payer: 59

## 2019-12-29 ENCOUNTER — Encounter: Payer: Self-pay | Admitting: General Surgery

## 2019-12-29 ENCOUNTER — Ambulatory Visit
Admission: RE | Admit: 2019-12-29 | Discharge: 2019-12-29 | Disposition: A | Discharge status: Home / Self Care | Payer: 59 | Attending: General Surgery | Admitting: General Surgery

## 2019-12-29 ENCOUNTER — Ambulatory Visit: Payer: 59 | Admitting: Anesthesiology

## 2019-12-29 ENCOUNTER — Encounter
Admission: RE | Disposition: A | Discharge status: Home / Self Care | Payer: Self-pay | Source: Home / Self Care | Attending: General Surgery

## 2019-12-29 DIAGNOSIS — C20 Malignant neoplasm of rectum: Secondary | ICD-10-CM | POA: Diagnosis not present

## 2019-12-29 DIAGNOSIS — Z87891 Personal history of nicotine dependence: Secondary | ICD-10-CM | POA: Diagnosis not present

## 2019-12-29 DIAGNOSIS — Z95828 Presence of other vascular implants and grafts: Secondary | ICD-10-CM

## 2019-12-29 DIAGNOSIS — Z79899 Other long term (current) drug therapy: Secondary | ICD-10-CM | POA: Insufficient documentation

## 2019-12-29 HISTORY — PX: PORTACATH PLACEMENT: SHX2246

## 2019-12-29 SURGERY — INSERTION, TUNNELED CENTRAL VENOUS DEVICE, WITH PORT
Anesthesia: General | Site: Chest | Laterality: Left

## 2019-12-29 MED ORDER — FENTANYL CITRATE (PF) 100 MCG/2ML IJ SOLN
INTRAMUSCULAR | Status: DC | PRN
  Administered 2019-12-29: 25 ug via INTRAVENOUS
  Administered 2019-12-29: 50 ug via INTRAVENOUS

## 2019-12-29 MED ORDER — PROPOFOL 500 MG/50ML IV EMUL
INTRAVENOUS | Status: DC | PRN
  Administered 2019-12-29: 120 ug/kg/min via INTRAVENOUS

## 2019-12-29 MED ORDER — LIDOCAINE HCL (PF) 1 % IJ SOLN
INTRAMUSCULAR | Status: DC | PRN
  Administered 2019-12-29: 10 mL

## 2019-12-29 MED ORDER — PROPOFOL 10 MG/ML IV BOLUS
INTRAVENOUS | Status: AC
  Filled 2019-12-29: qty 20

## 2019-12-29 MED ORDER — ONDANSETRON HCL 4 MG/2ML IJ SOLN: 4.0000 mg | Freq: Once | INTRAMUSCULAR | Status: DC | PRN

## 2019-12-29 MED ORDER — PROPOFOL 500 MG/50ML IV EMUL
INTRAVENOUS | Status: AC
  Filled 2019-12-29: qty 50

## 2019-12-29 MED ORDER — FAMOTIDINE 20 MG PO TABS
ORAL_TABLET | ORAL | Status: AC
  Administered 2019-12-29: 20 mg via ORAL
  Filled 2019-12-29: qty 1

## 2019-12-29 MED ORDER — CHLORHEXIDINE GLUCONATE 0.12 % MT SOLN: 15.0000 mL | Freq: Once | OROMUCOSAL | Status: AC

## 2019-12-29 MED ORDER — CHLORHEXIDINE GLUCONATE 0.12 % MT SOLN
OROMUCOSAL | Status: AC
  Administered 2019-12-29: 15 mL via OROMUCOSAL
  Filled 2019-12-29: qty 15

## 2019-12-29 MED ORDER — LACTATED RINGERS IV SOLN: INTRAVENOUS | Status: DC

## 2019-12-29 MED ORDER — MIDAZOLAM HCL 2 MG/2ML IJ SOLN
INTRAMUSCULAR | Status: AC
  Filled 2019-12-29: qty 2

## 2019-12-29 MED ORDER — MIDAZOLAM HCL 2 MG/2ML IJ SOLN
INTRAMUSCULAR | Status: DC | PRN
  Administered 2019-12-29: 2 mg via INTRAVENOUS

## 2019-12-29 MED ORDER — PROPOFOL 10 MG/ML IV BOLUS
INTRAVENOUS | Status: DC | PRN
  Administered 2019-12-29: 50 mg via INTRAVENOUS

## 2019-12-29 MED ORDER — CEFAZOLIN SODIUM-DEXTROSE 2-4 GM/100ML-% IV SOLN
2.0000 g | INTRAVENOUS | Status: AC
  Administered 2019-12-29: 2 g via INTRAVENOUS

## 2019-12-29 MED ORDER — FENTANYL CITRATE (PF) 100 MCG/2ML IJ SOLN: 25.0000 ug | INTRAMUSCULAR | Status: DC | PRN

## 2019-12-29 MED ORDER — FENTANYL CITRATE (PF) 100 MCG/2ML IJ SOLN
INTRAMUSCULAR | Status: AC
  Administered 2019-12-29: 25 ug via INTRAVENOUS
  Filled 2019-12-29: qty 2

## 2019-12-29 MED ORDER — FENTANYL CITRATE (PF) 100 MCG/2ML IJ SOLN
INTRAMUSCULAR | Status: AC
  Filled 2019-12-29: qty 2

## 2019-12-29 MED ORDER — ORAL CARE MOUTH RINSE: 15.0000 mL | Freq: Once | OROMUCOSAL | Status: AC

## 2019-12-29 MED ORDER — CEFAZOLIN SODIUM-DEXTROSE 2-4 GM/100ML-% IV SOLN
INTRAVENOUS | Status: AC
  Filled 2019-12-29: qty 100

## 2019-12-29 MED ORDER — SODIUM CHLORIDE (PF) 0.9 % IJ SOLN
INTRAMUSCULAR | Status: AC
  Filled 2019-12-29: qty 50

## 2019-12-29 MED ORDER — FAMOTIDINE 20 MG PO TABS: 20.0000 mg | ORAL_TABLET | Freq: Once | ORAL | Status: AC

## 2019-12-29 MED ORDER — SODIUM CHLORIDE (PF) 0.9 % IJ SOLN
INTRAMUSCULAR | Status: DC | PRN
  Administered 2019-12-29: 10 mL via INTRAVENOUS

## 2019-12-29 SURGICAL SUPPLY — 32 items
BAG DECANTER FOR FLEXI CONT (MISCELLANEOUS) ×3 IMPLANT
BLADE SURG 15 STRL SS SAFETY (BLADE) ×3 IMPLANT
CHLORAPREP W/TINT 26 (MISCELLANEOUS) ×3 IMPLANT
CLOSURE WOUND 1/2 X4 (GAUZE/BANDAGES/DRESSINGS) ×1
COVER LIGHT HANDLE STERIS (MISCELLANEOUS) ×6 IMPLANT
COVER WAND RF STERILE (DRAPES) ×3 IMPLANT
DECANTER SPIKE VIAL GLASS SM (MISCELLANEOUS) ×6 IMPLANT
DRAPE C-ARM XRAY 36X54 (DRAPES) ×3 IMPLANT
DRAPE LAPAROTOMY TRNSV 106X77 (MISCELLANEOUS) ×3 IMPLANT
DRSG TEGADERM 2-3/8X2-3/4 SM (GAUZE/BANDAGES/DRESSINGS) ×3 IMPLANT
DRSG TEGADERM 4X4.75 (GAUZE/BANDAGES/DRESSINGS) ×3 IMPLANT
DRSG TELFA 4X3 1S NADH ST (GAUZE/BANDAGES/DRESSINGS) ×3 IMPLANT
ELECT REM PT RETURN 9FT ADLT (ELECTROSURGICAL) ×3
ELECTRODE REM PT RTRN 9FT ADLT (ELECTROSURGICAL) ×1 IMPLANT
GLOVE BIO SURGEON STRL SZ7.5 (GLOVE) ×3 IMPLANT
GLOVE INDICATOR 8.0 STRL GRN (GLOVE) ×3 IMPLANT
GOWN STRL REUS W/ TWL LRG LVL3 (GOWN DISPOSABLE) ×2 IMPLANT
GOWN STRL REUS W/TWL LRG LVL3 (GOWN DISPOSABLE) ×4
IV NS 500ML (IV SOLUTION) ×2
IV NS 500ML BAXH (IV SOLUTION) ×1 IMPLANT
KIT PORT POWER 8FR ISP CVUE (Port) ×3 IMPLANT
KIT TURNOVER KIT A (KITS) ×3 IMPLANT
LABEL OR SOLS (LABEL) ×3 IMPLANT
PACK PORT-A-CATH (MISCELLANEOUS) ×3 IMPLANT
STRIP CLOSURE SKIN 1/2X4 (GAUZE/BANDAGES/DRESSINGS) ×2 IMPLANT
SUT PROLENE 3 0 SH DA (SUTURE) ×3 IMPLANT
SUT VIC AB 3-0 SH 27 (SUTURE) ×2
SUT VIC AB 3-0 SH 27X BRD (SUTURE) ×1 IMPLANT
SUT VIC AB 4-0 FS2 27 (SUTURE) ×3 IMPLANT
SWABSTK COMLB BENZOIN TINCTURE (MISCELLANEOUS) ×3 IMPLANT
SYR 10ML LL (SYRINGE) ×3 IMPLANT
SYR 10ML SLIP (SYRINGE) ×3 IMPLANT

## 2019-12-29 NOTE — H&P (Signed)
Stephen Vasquez [id number] 03-17-1961     HPI:  59 y/o male with advanced rectal cancer. Candidate for neo-adjuvant chemotherapy.  For port placement.   Medications Prior to Admission  Medication Sig Dispense Refill Last Dose  . azelastine (ASTELIN) 0.1 % nasal spray Place 2 sprays into both nostrils 2 (two) times daily. Use in each nostril as directed 30 mL 12   . dicyclomine (BENTYL) 10 MG capsule Take 1 capsule (10 mg total) by mouth 4 (four) times daily -  before meals and at bedtime. 120 capsule 1 Past Week at Unknown time  . montelukast (SINGULAIR) 10 MG tablet Take 1 tablet (10 mg total) by mouth at bedtime. 30 tablet 3 Past Week at Unknown time  . naproxen sodium (ALEVE) 220 MG tablet Take 220 mg by mouth 2 (two) times daily as needed (pain).   Past Week at Unknown time  . ondansetron (ZOFRAN) 8 MG tablet One pill every 8 hours as needed for nausea/vomitting. (Patient taking differently: Take 8 mg by mouth every 8 (eight) hours as needed for nausea or vomiting. ) 40 tablet 1 Past Month at Unknown time  . oxymetazoline (AFRIN) 0.05 % nasal spray Place 1 spray into both nostrils 2 (two) times daily as needed for congestion.   12/29/2019 at Unknown time  . Probiotic CAPS Take 1 capsule by mouth daily.   12/27/2019  . lidocaine-prilocaine (EMLA) cream Apply 1 application topically as needed. (Patient taking differently: Apply 1 application topically as needed (port access). ) 30 g 0   . prochlorperazine (COMPAZINE) 10 MG tablet Take 1 tablet (10 mg total) by mouth every 6 (six) hours as needed for nausea or vomiting. 40 tablet 1    No Known Allergies Past Medical History:  Diagnosis Date  . Allergic rhinitis   . Heart murmur   . Rectal cancer [facility])    Past Surgical History:  Procedure Laterality Date  . COLONOSCOPY WITH PROPOFOL N/A 12/15/2019   Procedure: COLONOSCOPY WITH PROPOFOL;  Surgeon: [name], MD;  Location: [facility] ENDOSCOPY;  Service: Gastroenterology;  Laterality: N/A;    Social History   Socioeconomic History  . Marital status: Single    Spouse name: Not on file  . Number of children: Not on file  . Years of education: Not on file  . Highest education level: Not on file  Occupational History  . Not on file  Tobacco Use  . Smoking status: Former Research scientist (life sciences)  . Smokeless tobacco: Never Used  Substance and Sexual Activity  . Alcohol use: Not Currently  . Drug use: Never  . Sexual activity: Yes  Other Topics Concern  . 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 2 children- in boy and girls in late 43s.    Social Determinants of Health   Financial Resource Strain:   . Difficulty of Paying Living Expenses:   Food Insecurity:   . Worried About Charity fundraiser in the Last Year:   . Arboriculturist in the Last Year:   Transportation Needs:   . Film/video editor (Medical):   Marland Kitchen Lack of Transportation (Non-Medical):   Physical Activity:   . Days of Exercise per Week:   . Minutes of Exercise per Session:   Stress:   . Feeling of Stress :   Social Connections:   . Frequency of Communication with Friends and Family:   . Frequency of Social  Gatherings with Friends and Family:   . Attends Religious Services:   . Active Member of Clubs or Organizations:   . Attends Archivist Meetings:   Marland Kitchen Marital Status:   Intimate Partner Violence:   . Fear of Current or Ex-Partner:   . Emotionally Abused:   Marland Kitchen Physically Abused:   . 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 2 children- in boy and girls in late 24s.      ROS: Negative.     PE: HEENT: Negative. Lungs: Clear. Cardio: RR.   Assessment/Plan:  Proceed with planned power port placement.    Forest Gleason [facility] 12/29/2019

## 2019-12-29 NOTE — Op Note (Signed)
Preoperative diagnosis: Rectal cancer, need for central venous access.  Postoperative diagnosis: Same.  Operative procedure: Left subclavian PowerPort placement with ultrasound and fluoroscopic guidance.  Operating surgeon: Hervey Ard, MD.  Anesthesia: Attended local, 10 cc 1% plain Xylocaine.  Estimated blood loss: Less than 5 cc.  Clinical note: This 59 year old male was recently noted with a T3, and 1 rectal carcinoma and he is a candidate for neoadjuvant chemo and radiation.  Central venous access was requested by his treating oncologist.  Operative note: With the patient supine on the table, left arm tucked and in Trendelenburg position the left neck and chest was cleansed with ChloraPrep and draped.  Hered previously been removed with clippers.  Ultrasound showed the subclavian vein was patent but a little bit small based on the late hour.  Increased Trendelenburg was utilized as well as additional IV fluids which made the vein easier to approach.  Anesthesia was infiltrated.  Under ultrasound guidance the vein was cannulated followed by passage of the guidewire and dilator.  Under fluoroscopy the catheter was positioned at the junction of the SVC and right atrium.  The catheter was tunneled to a pocket on the left chest.  The port was anchored to the deep tissue with-3-0 Vicryl Prolene sutures x2.  The subcutaneous fat was approximated with a running 3-0 Vicryl suture.  The skin closed with a running 4-0 Vicryl subcuticular suture.  Benzoin and Steri-Strips were applied.  The catheter was accessed and easily irrigated and aspirated.  Final 10 cc flushed with injectable saline.  Telfa and Tegaderm dressing applied.  Erect portable chest x-ray in the recovery room showed no evidence of pneumothorax and the catheter tip as described above.

## 2019-12-29 NOTE — Anesthesia Postprocedure Evaluation (Signed)
Anesthesia Post Note  Patient: Stephen Vasquez  Procedure(s) Performed: INSERTION PORT-A-CATH (Left Chest)  Patient location during evaluation: PACU Anesthesia Type: General Level of consciousness: awake and alert and oriented Pain management: pain level controlled Vital Signs Assessment: post-procedure vital signs reviewed and stable Respiratory status: spontaneous breathing, nonlabored ventilation and respiratory function stable Cardiovascular status: blood pressure returned to baseline and stable Postop Assessment: no signs of nausea or vomiting Anesthetic complications: no     Last Vitals:  Vitals:   12/29/19 1512 12/29/19 1522  BP: 102/71 108/66  Pulse: 63 65  Resp: 10 14  Temp: (!) 36.2 C (!) 36.2 C  SpO2: 100% 100%    Last Pain:  Vitals:   12/29/19 1522  TempSrc: Temporal  PainSc: 0-No pain                 Rein Popov

## 2019-12-29 NOTE — OR Nursing (Signed)
Dr Bary Castilla in to see patient & spouse 1529 postop.

## 2019-12-29 NOTE — Transfer of Care (Signed)
Immediate Anesthesia Transfer of Care Note  Patient: Stephen Vasquez  Procedure(s) Performed: INSERTION PORT-A-CATH (Left Chest)  Patient Location: PACU  Anesthesia Type:General  Level of Consciousness: awake, alert  and oriented  Airway & Oxygen Therapy: Patient Spontanous Breathing  Post-op Assessment: Report given to RN and Post -op Vital signs reviewed and stable  Post vital signs: Reviewed and stable  Last Vitals:  Vitals Value Taken Time  BP 102/65 12/29/19 1442  Temp    Pulse 87 12/29/19 1445  Resp 19 12/29/19 1445  SpO2 97 % 12/29/19 1445  Vitals shown include unvalidated device data.  Last Pain:  Vitals:   12/29/19 1203  TempSrc: Temporal  PainSc: 0-No pain         Complications: No apparent anesthesia complications

## 2019-12-29 NOTE — Anesthesia Preprocedure Evaluation (Signed)
Anesthesia Evaluation  Patient identified by MRN, date of birth, ID band Patient awake    Reviewed: Allergy & Precautions, NPO status , Patient's Chart, lab work & pertinent test results  History of Anesthesia Complications Negative for: history of anesthetic complications  Airway Mallampati: II  TM Distance: >3 FB Neck ROM: Full    Dental no notable dental hx.    Pulmonary neg pulmonary ROS, neg sleep apnea, neg COPD, former smoker,    breath sounds clear to auscultation- rhonchi (-) wheezing      Cardiovascular Exercise Tolerance: Good (-) hypertension(-) CAD, (-) Past MI, (-) Cardiac Stents and (-) CABG  Rhythm:Regular Rate:Normal - Systolic murmurs and - Diastolic murmurs    Neuro/Psych neg Seizures negative neurological ROS  negative psych ROS   GI/Hepatic negative GI ROS, Neg liver ROS,   Endo/Other  negative endocrine ROSneg diabetes  Renal/GU negative Renal ROS     Musculoskeletal negative musculoskeletal ROS (+)   Abdominal (+) + obese,   Peds  Hematology negative hematology ROS (+)   Anesthesia Other Findings Past Medical History: No date: Allergic rhinitis No date: Heart murmur No date: Rectal cancer (HCC)   Reproductive/Obstetrics                             Anesthesia Physical Anesthesia Plan  ASA: II  Anesthesia Plan: General   Post-op Pain Management:    Induction: Intravenous  PONV Risk Score and Plan: 1 and Propofol infusion  Airway Management Planned: Natural Airway  Additional Equipment:   Intra-op Plan:   Post-operative Plan:   Informed Consent: I have reviewed the patients History and Physical, chart, labs and discussed the procedure including the risks, benefits and alternatives for the proposed anesthesia with the patient or authorized representative who has indicated his/her understanding and acceptance.     Dental advisory given  Plan  Discussed with: CRNA and Anesthesiologist  Anesthesia Plan Comments:         Anesthesia Quick Evaluation

## 2019-12-29 NOTE — Discharge Instructions (Addendum)
AMBULATORY SURGERY  DISCHARGE INSTRUCTIONS   1) The drugs that you were given will stay in your system until tomorrow so for the next 24 hours you should not:  A) Drive an automobile B) Make any legal decisions C) Drink any alcoholic beverage   2) You may resume regular meals tomorrow.  Today it is better to start with liquids and gradually work up to solid foods.  You may eat anything you prefer, but it is better to start with liquids, then soup and crackers, and gradually work up to solid foods.   3) Please notify your doctor immediately if you have any unusual bleeding, trouble breathing, redness and pain at the surgery site, drainage, fever, or pain not relieved by medication.    4) Additional Instructions:   Per MD - tylenol, advil, aleve for discomfort, call for significant pain.        Please contact your physician with any problems or Same Day Surgery at [phone], Monday through Friday 6 am to 4 pm, or [place] at [facility] number at [phone].

## 2019-12-29 NOTE — OR Nursing (Signed)
Tylenol/Advil/Aleve for discomfort per Dr. Bary Castilla, tele.  Added to discharge instructions.

## 2019-12-30 ENCOUNTER — Other Ambulatory Visit: Payer: Self-pay | Admitting: Internal Medicine

## 2019-12-30 ENCOUNTER — Telehealth: Payer: Self-pay | Admitting: *Deleted

## 2019-12-30 DIAGNOSIS — C20 Malignant neoplasm of rectum: Secondary | ICD-10-CM

## 2019-12-30 NOTE — Telephone Encounter (Signed)
Patient informed that per Dr B ok to take Claritin D one per day. Patient thanked me for returning his call

## 2019-12-30 NOTE — Telephone Encounter (Signed)
Patient called reporting that he has sinus stuffiness and is asking what he can take, over the counter or what. Please advise

## 2019-12-30 NOTE — Telephone Encounter (Signed)
Hassan Rowan- recommend clairitin-D one a day; over the counter.  GB

## 2020-01-01 ENCOUNTER — Ambulatory Visit
Admission: RE | Admit: 2020-01-01 | Discharge: 2020-01-01 | Disposition: A | Discharge status: Home / Self Care | Payer: 59 | Source: Ambulatory Visit | Attending: Internal Medicine | Admitting: Internal Medicine

## 2020-01-01 DIAGNOSIS — C20 Malignant neoplasm of rectum: Secondary | ICD-10-CM | POA: Insufficient documentation

## 2020-01-04 ENCOUNTER — Inpatient Hospital Stay: Payer: 59

## 2020-01-04 ENCOUNTER — Inpatient Hospital Stay: Payer: 59 | Attending: Internal Medicine

## 2020-01-04 ENCOUNTER — Encounter: Payer: Self-pay | Admitting: Internal Medicine

## 2020-01-04 ENCOUNTER — Other Ambulatory Visit: Payer: Self-pay

## 2020-01-04 ENCOUNTER — Inpatient Hospital Stay (HOSPITAL_BASED_OUTPATIENT_CLINIC_OR_DEPARTMENT_OTHER): Payer: 59 | Admitting: Internal Medicine

## 2020-01-04 ENCOUNTER — Telehealth: Payer: Self-pay | Admitting: *Deleted

## 2020-01-04 DIAGNOSIS — D500 Iron deficiency anemia secondary to blood loss (chronic): Secondary | ICD-10-CM | POA: Insufficient documentation

## 2020-01-04 DIAGNOSIS — G893 Neoplasm related pain (acute) (chronic): Secondary | ICD-10-CM | POA: Diagnosis not present

## 2020-01-04 DIAGNOSIS — D509 Iron deficiency anemia, unspecified: Secondary | ICD-10-CM | POA: Insufficient documentation

## 2020-01-04 DIAGNOSIS — Z79899 Other long term (current) drug therapy: Secondary | ICD-10-CM | POA: Diagnosis not present

## 2020-01-04 DIAGNOSIS — Z7189 Other specified counseling: Secondary | ICD-10-CM

## 2020-01-04 DIAGNOSIS — Z5189 Encounter for other specified aftercare: Secondary | ICD-10-CM | POA: Diagnosis not present

## 2020-01-04 DIAGNOSIS — Z7952 Long term (current) use of systemic steroids: Secondary | ICD-10-CM | POA: Insufficient documentation

## 2020-01-04 DIAGNOSIS — Z87891 Personal history of nicotine dependence: Secondary | ICD-10-CM | POA: Insufficient documentation

## 2020-01-04 DIAGNOSIS — Z5111 Encounter for antineoplastic chemotherapy: Secondary | ICD-10-CM | POA: Diagnosis present

## 2020-01-04 DIAGNOSIS — C20 Malignant neoplasm of rectum: Secondary | ICD-10-CM | POA: Diagnosis not present

## 2020-01-04 LAB — CBC WITH DIFFERENTIAL/PLATELET
Abs Immature Granulocytes: 0.01 10*3/uL (ref 0.00–0.07)
Basophils Absolute: 0.1 10*3/uL (ref 0.0–0.1)
Basophils Relative: 1 %
Eosinophils Absolute: 0.2 10*3/uL (ref 0.0–0.5)
Eosinophils Relative: 2 %
HCT: 33.5 % — ABNORMAL LOW (ref 39.0–52.0)
Hemoglobin: 10.5 g/dL — ABNORMAL LOW (ref 13.0–17.0)
Immature Granulocytes: 0 %
Lymphocytes Relative: 26 %
Lymphs Abs: 1.8 10*3/uL (ref 0.7–4.0)
MCH: 23.2 pg — ABNORMAL LOW (ref 26.0–34.0)
MCHC: 31.3 g/dL (ref 30.0–36.0)
MCV: 74.1 fL — ABNORMAL LOW (ref 80.0–100.0)
Monocytes Absolute: 0.5 10*3/uL (ref 0.1–1.0)
Monocytes Relative: 7 %
Neutro Abs: 4.5 10*3/uL (ref 1.7–7.7)
Neutrophils Relative %: 64 %
Platelets: 297 10*3/uL (ref 150–400)
RBC: 4.52 MIL/uL (ref 4.22–5.81)
RDW: 15 % (ref 11.5–15.5)
WBC: 7.1 10*3/uL (ref 4.0–10.5)
nRBC: 0 % (ref 0.0–0.2)

## 2020-01-04 LAB — COMPREHENSIVE METABOLIC PANEL
ALT: 13 U/L (ref 0–44)
AST: 16 U/L (ref 15–41)
Albumin: 3.9 g/dL (ref 3.5–5.0)
Alkaline Phosphatase: 78 U/L (ref 38–126)
Anion gap: 6 (ref 5–15)
BUN: 20 mg/dL (ref 6–20)
CO2: 26 mmol/L (ref 22–32)
Calcium: 8.7 mg/dL — ABNORMAL LOW (ref 8.9–10.3)
Chloride: 107 mmol/L (ref 98–111)
Creatinine, Ser: 0.89 mg/dL (ref 0.61–1.24)
GFR calc Af Amer: >60 mL/min (ref >60)
GFR calc non Af Amer: >60 mL/min (ref >60)
Glucose, Bld: 96 mg/dL (ref 70–99)
Potassium: 4.1 mmol/L (ref 3.5–5.1)
Sodium: 139 mmol/L (ref 135–145)
Total Bilirubin: 0.5 mg/dL (ref 0.3–1.2)
Total Protein: 7.6 g/dL (ref 6.5–8.1)

## 2020-01-04 MED ORDER — DEXTROSE 5 % IV SOLN
Freq: Once | INTRAVENOUS | Status: AC
  Filled 2020-01-04: qty 250

## 2020-01-04 MED ORDER — LEUCOVORIN CALCIUM INJECTION 350 MG
1000.0000 mg | Freq: Once | INTRAVENOUS | Status: AC
  Administered 2020-01-04: 1000 mg via INTRAVENOUS
  Filled 2020-01-04: qty 50

## 2020-01-04 MED ORDER — SODIUM CHLORIDE 0.9% FLUSH
10.0000 mL | Freq: Once | INTRAVENOUS | Status: AC
  Administered 2020-01-04: 10 mL via INTRAVENOUS
  Filled 2020-01-04: qty 10

## 2020-01-04 MED ORDER — HEPARIN SOD (PORK) LOCK FLUSH 100 UNIT/ML IV SOLN
500.0000 [IU] | Freq: Once | INTRAVENOUS | Status: DC
  Filled 2020-01-04: qty 5

## 2020-01-04 MED ORDER — PALONOSETRON HCL INJECTION 0.25 MG/5ML
0.2500 mg | Freq: Once | INTRAVENOUS | Status: AC
  Administered 2020-01-04: 0.25 mg via INTRAVENOUS
  Filled 2020-01-04: qty 5

## 2020-01-04 MED ORDER — OXALIPLATIN CHEMO INJECTION 100 MG/20ML
200.0000 mg | Freq: Once | INTRAVENOUS | Status: AC
  Administered 2020-01-04: 200 mg via INTRAVENOUS
  Filled 2020-01-04: qty 40

## 2020-01-04 MED ORDER — SODIUM CHLORIDE 0.9 % IV SOLN
2400.0000 mg/m2 | INTRAVENOUS | Status: DC
  Administered 2020-01-04: 6050 mg via INTRAVENOUS
  Filled 2020-01-04: qty 121

## 2020-01-04 MED ORDER — FLUOROURACIL CHEMO INJECTION 2.5 GM/50ML
400.0000 mg/m2 | Freq: Once | INTRAVENOUS | Status: AC
  Administered 2020-01-04: 1000 mg via INTRAVENOUS
  Filled 2020-01-04: qty 20

## 2020-01-04 MED ORDER — SODIUM CHLORIDE 0.9 % IV SOLN
10.0000 mg | Freq: Once | INTRAVENOUS | Status: AC
  Administered 2020-01-04: 10 mg via INTRAVENOUS
  Filled 2020-01-04: qty 10

## 2020-01-04 NOTE — Assessment & Plan Note (Addendum)
#  Rectal adenocarcinoma- moderate differentiated; stage III [multiple pelvic lymph nodes]; question borderline 9 mm retroperitoneal neuropathy. PET-bulky rectal mass; multiple lymph nodes noted; no uptake in the retroperitoneal lymph nodes; MRI rectum- T3N2M0;   Pretreatment CEA 94.  Plan TNT [total neoadjuvant therapy]; plan FOLFOX chemotherapy.  #Proceed with cycle #1 of FOLFOX chemotherapy; Labs today reviewed;  acceptable for treatment today.  Again reviewed the potential side effects of chemotherapy including but not limited to nausea vomiting diarrhea hand-foot syndrome; neuropathy.  Discussed with Dr. Tollie Pizza; recommends evaluation with Dr. Audie Clear; for robotic surgery post TNT  #Iron deficient anemia/chronic GI bleed-Hb 10.5- -plan IV iron; Discussed the potential acute infusion reactions with IV iron; which are quite rare.  Patient understands the risk; will proceed with infusions at next visit.Marland Kitchen  DISPOSITION: Fax papers-cancer policy paper/ [phone] # chemo today; pump off in 3 days #Follow-up in 2 weeks; MD; labs CBC CMP; CBC; Venofer;FOLFOX chemotherapy; pump off in 3 days

## 2020-01-04 NOTE — Telephone Encounter (Signed)
Per patient's request Faxed completed reliance standard cancer policy and path report to 1 [phone]

## 2020-01-04 NOTE — Progress Notes (Signed)
Pt tolerated infusion well. Pump instructions reviewed with pt, Pt verbalizes understanding. Pt stable at discharge. No s/s of distress noted.

## 2020-01-04 NOTE — Progress Notes (Signed)
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 CEA- 36. CT C/A/P- Large mass involving the rectum is identified compatible with primary rectal carcinoma. This has a large, 6.7 cm exophytic component in the left pelvis. Multiple perirectal lymph nodes are identified compatible with metastatic adenopathy; Borderline enlarged aortocaval node measures 0.9 cm; PET-bulky rectal mass; multiple lymph nodes noted; no uptake in the retroperitoneal lymph nodes; MRI rectum- T3N2M0;   Pretreatment CEA 94; TNT  # June 1st 2021- FOLFOX  # SURVIVORSHIP:   # GENETICS:   DIAGNOSIS: RECTAL CA  STAGE:   III     ;  GOALS: cure  CURRENT/MOST RECENT THERAPY : FOLFOX [C]   Rectal cancer ([place])  12/20/2019 Initial Diagnosis   Rectal cancer ([place])   01/04/2020 -  Chemotherapy   The patient had palonosetron (ALOXI) injection 0.25 mg, 0.25 mg, Intravenous,  Once, 1 of 8 cycles leucovorin 1,008 mg in dextrose 5 % 250 mL infusion, 400 mg/m2 = 1,008 mg, Intravenous,  Once, 1 of 8 cycles oxaliplatin (ELOXATIN) 215 mg in dextrose 5 % 500 mL chemo infusion, 85 mg/m2 = 215 mg, Intravenous,  Once, 1 of 8 cycles fluorouracil (ADRUCIL) chemo injection 1,000 mg, 400 mg/m2 = 1,000 mg, Intravenous,  Once, 1 of 8 cycles fluorouracil (ADRUCIL) 6,050 mg in sodium chloride 0.9 % 129 mL chemo infusion, 2,400 mg/m2 = 6,050 mg, Intravenous, 1 Day/Dose, 1 of 8 cycles  for chemotherapy treatment.       HISTORY OF  PRESENTING ILLNESS:  Stephen Vasquez 59 y.o.  male newly diagnosed rectal cancer is here to proceed with chemotherapy/also review PET scan MRI rectum.  In the interim patient was evaluated by surgery; also had Mediport placed.  Patient continues have intermittent blood in stools.  Otherwise no constipation no diarrhea.  Complains of low pelvic/back pain.  Chronic.  Not any worse.   Review of Systems  Constitutional: Negative for chills, diaphoresis, fever, malaise/fatigue and weight loss.  HENT: Negative for nosebleeds and sore throat.   Eyes: Negative for double vision.  Respiratory: Negative for cough, hemoptysis, sputum production, shortness of breath and wheezing.   Cardiovascular: Negative for chest pain, palpitations, orthopnea and leg swelling.  Gastrointestinal: Positive for blood in stool. Negative for abdominal pain, constipation, diarrhea, heartburn, melena, nausea and vomiting.  Genitourinary: Negative for dysuria, frequency and urgency.  Musculoskeletal: Positive for joint pain. Negative for back pain.  Skin: Negative.  Negative for itching and rash.  Neurological: Negative for dizziness, tingling, focal weakness, weakness and headaches.  Endo/Heme/Allergies: Does not bruise/bleed easily.  Psychiatric/Behavioral: Negative for depression. The patient is not nervous/anxious and does not have insomnia.      MEDICAL HISTORY:  Past Medical History:  Diagnosis Date  . Allergic rhinitis   . Heart murmur   . Rectal cancer [facility])     SURGICAL HISTORY: Past Surgical History:  Procedure Laterality Date  . COLONOSCOPY WITH PROPOFOL N/A 12/15/2019   Procedure: COLONOSCOPY WITH PROPOFOL;  Surgeon: [name], MD;  Location: [facility] ENDOSCOPY;  Service: Gastroenterology;  Laterality: N/A;  . PORTACATH PLACEMENT Left 12/29/2019   Procedure: INSERTION  PORT-A-CATH;  Surgeon: Robert Bellow, MD;  Location: [facility] ORS;  Service: General;  Laterality: Left;    SOCIAL HISTORY: Social  History   Socioeconomic History  . Marital status: Single    Spouse name: Not on file  . Number of children: Not on file  . Years of education: Not on file  . Highest education level: Not on file  Occupational History  . Not on file  Tobacco Use  . Smoking status: Former Research scientist (life sciences)  . Smokeless tobacco: Never Used  Substance and Sexual Activity  . Alcohol use: Not Currently  . Drug use: Never  . Sexual activity: Yes  Other Topics Concern  . Not on file  Social History Narrative   Lives in [place]; Freight forwarder at [employer]; 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 2 children- in boy and girls in late 13s.    Social Determinants of Health   Financial Resource Strain:   . Difficulty of Paying Living Expenses:   Food Insecurity:   . Worried About Charity fundraiser in the Last Year:   . Arboriculturist in the Last Year:   Transportation Needs:   . Film/video editor (Medical):   Marland Kitchen Lack of Transportation (Non-Medical):   Physical Activity:   . Days of Exercise per Week:   . Minutes of Exercise per Session:   Stress:   . Feeling of Stress :   Social Connections:   . Frequency of Communication with Friends and Family:   . Frequency of Social Gatherings with Friends and Family:   . Attends Religious Services:   . Active Member of Clubs or Organizations:   . Attends Archivist Meetings:   Marland Kitchen Marital Status:   Intimate Partner Violence:   . Fear of Current or Ex-Partner:   . Emotionally Abused:   Marland Kitchen Physically Abused:   . Sexually Abused:     FAMILY HISTORY: Family History  Problem Relation Age of Onset  . Hypertension Mother   . Hypertension Father   . Stomach cancer Father        in 25s- survived.   Marland Kitchen Heart disease Brother     ALLERGIES:  has No Known Allergies.  MEDICATIONS:  Current Outpatient Medications  Medication Sig Dispense Refill  . azelastine (ASTELIN) 0.1 % nasal spray Place 2 sprays into both nostrils 2 (two) times daily. Use in each nostril  as directed 30 mL 12  . dicyclomine (BENTYL) 10 MG capsule Take 1 capsule (10 mg total) by mouth 4 (four) times daily -  before meals and at bedtime. 120 capsule 1  . lidocaine-prilocaine (EMLA) cream Apply 1 application topically as needed. (Patient taking differently: Apply 1 application topically as needed (port access). ) 30 g 0  . montelukast (SINGULAIR) 10 MG tablet Take 1 tablet (10 mg total) by mouth at bedtime. 30 tablet 3  . naproxen sodium (ALEVE) 220 MG tablet Take 220 mg by mouth 2 (two) times daily as needed (pain).    . ondansetron (ZOFRAN) 8 MG tablet One pill every 8 hours as needed for nausea/vomitting. (Patient taking differently: Take 8 mg by mouth every 8 (eight) hours as needed for nausea or vomiting. ) 40 tablet 1  . oxymetazoline (AFRIN) 0.05 % nasal spray Place 1 spray into both nostrils 2 (two) times daily as needed for congestion.    . Probiotic CAPS Take 1 capsule by mouth daily.    . prochlorperazine (COMPAZINE) 10 MG tablet Take 1 tablet (  10 mg total) by mouth every 6 (six) hours as needed for nausea or vomiting. 40 tablet 1   No current facility-administered medications for this visit.   Facility-Administered Medications Ordered in Other Visits  Medication Dose Route Frequency Provider Last Rate Last Admin  . fluorouracil (ADRUCIL) 6,050 mg in sodium chloride 0.9 % 129 mL chemo infusion  2,400 mg/m2 (Treatment Plan Recorded) Intravenous 1 day or 1 dose Charlaine Dalton R, MD      . fluorouracil (ADRUCIL) chemo injection 1,000 mg  400 mg/m2 (Treatment Plan Recorded) Intravenous Once Charlaine Dalton R, MD      . heparin lock flush 100 unit/mL  500 Units Intravenous Once Charlaine Dalton R, MD      . leucovorin 1,000 mg in dextrose 5 % 250 mL infusion  1,000 mg Intravenous Once Charlaine Dalton R, MD 150 mL/hr at 01/04/20 1047 1,000 mg at 01/04/20 1047  . oxaliplatin (ELOXATIN) 200 mg in dextrose 5 % 500 mL chemo infusion  200 mg Intravenous Once Charlaine Dalton R, MD 270 mL/hr at 01/04/20 1047 200 mg at 01/04/20 1047      .  PHYSICAL EXAMINATION: ECOG PERFORMANCE STATUS: 1 - Symptomatic but completely ambulatory  Vitals:   01/04/20 0900  BP: 131/83  Pulse: 72  Resp: 20  Temp: (!) 96.4 F (35.8 C)   Filed Weights   01/04/20 0900  Weight: 268 lb 12.8 oz (121.9 kg)    Physical Exam  Constitutional: He is oriented to person, place, and time and well-developed, well-nourished, and in no distress.  HENT:  Head: Normocephalic and atraumatic.  Mouth/Throat: Oropharynx is clear and moist. No oropharyngeal exudate.  Eyes: Pupils are equal, round, and reactive to light.  Cardiovascular: Normal rate and regular rhythm.  Pulmonary/Chest: Effort normal and breath sounds normal. No respiratory distress. He has no wheezes.  Abdominal: Soft. Bowel sounds are normal. He exhibits no distension and no mass. There is no abdominal tenderness. There is no rebound and no guarding.  Musculoskeletal:        General: No tenderness or edema. Normal range of motion.     Cervical back: Normal range of motion and neck supple.  Neurological: He is alert and oriented to person, place, and time.  Skin: Skin is warm.  Psychiatric: Affect normal.     LABORATORY DATA:  I have reviewed the data as listed Lab Results  Component Value Date   WBC 7.1 01/04/2020   HGB 10.5 (L) 01/04/2020   HCT 33.5 (L) 01/04/2020   MCV 74.1 (L) 01/04/2020   PLT 297 01/04/2020   Recent Labs    12/20/19 0952 01/04/20 0835  NA 142 139  K 4.4 4.1  CL 107 107  CO2 28 26  GLUCOSE 103* 96  BUN 20 20  CREATININE 1.08 0.89  CALCIUM 9.1 8.7*  GFRNONAA >60 >60  GFRAA >60 >60  PROT 7.6 7.6  ALBUMIN 3.7 3.9  AST 15 16  ALT 13 13  ALKPHOS 83 78  BILITOT 0.4 0.5    RADIOGRAPHIC STUDIES: I have personally reviewed the radiological images as listed and agreed with the findings in the report. CT CHEST W CONTRAST  Result Date: 12/17/2019 CLINICAL DATA:  Colorectal  carcinoma staging. EXAM: CT CHEST, ABDOMEN, AND PELVIS WITH CONTRAST TECHNIQUE: Multidetector CT imaging of the chest, abdomen and pelvis was performed following the standard protocol during bolus administration of intravenous contrast. CONTRAST:  176mL OMNIPAQUE IOHEXOL 300 MG/ML  SOLN COMPARISON:  None. FINDINGS: CT CHEST FINDINGS  Cardiovascular: The heart size appears within normal limits. Aortic atherosclerosis. Lad, left circumflex coronary artery calcifications. Mediastinum/Nodes: No enlarged mediastinal, hilar, or axillary lymph nodes. Thyroid gland, trachea, and esophagus demonstrate no significant findings. Lungs/Pleura: There is no pleural effusion identified. No airspace consolidation, atelectasis, or pneumothorax. No suspicious lung nodules. Musculoskeletal: No chest wall mass or suspicious bone lesions identified. CT ABDOMEN PELVIS FINDINGS Hepatobiliary: No focal liver abnormality is seen. No gallstones, gallbladder wall thickening, or biliary dilatation. Pancreas: Unremarkable. No pancreatic ductal dilatation or surrounding inflammatory changes. Spleen: Normal in size without focal abnormality. Adrenals/Urinary Tract: Normal appearance of the adrenal glands. The kidneys are unremarkable. No mass or hydronephrosis. Urinary bladder is unremarkable. Stomach/Bowel: Stomach is within normal limits. Appendix appears normal. No evidence of small bowel wall thickening, distention, or inflammatory changes. There is a large mass within the lower anatomic pelvis involving the rectum. This measures 8.8 x 8.7 by 10.3 cm. This has a large, 6.7 cm exophytic component in the left pelvis, image 121/2. Vascular/Lymphatic: Aortic atherosclerosis. Borderline enlarged aortocaval node measures 0.9 cm, image 84/2. No pathologic adenopathy noted within the abdomen however prominent left external iliac node measures 7 mm. Multiple perirectal lymph nodes are identified. The largest is in the left posterior pelvis measuring 1.1  cm, image 117/2. Reproductive: Prostate is unremarkable. Other: Small bilateral fat containing inguinal hernias. No ascites. No focal fluid collections Musculoskeletal: No acute or significant osseous findings. IMPRESSION: 1. Large mass involving the rectum is identified compatible with primary rectal carcinoma. This has a large, 6.7 cm exophytic component in the left pelvis. Multiple perirectal lymph nodes are identified compatible with metastatic adenopathy. 2. Borderline enlarged aortocaval node measures 0.9 cm. No pathologic adenopathy noted within the abdomen however. Multi vessel coronary artery calcifications noted. 3. Aortic atherosclerosis, in addition to lad and left circumflex coronary artery disease. Please note that although the presence of coronary artery calcium documents the presence of coronary artery disease, the severity of this disease and any potential stenosis cannot be assessed on this non-gated CT examination. Assessment for potential risk factor modification, dietary therapy or pharmacologic therapy may be warranted, if clinically indicated. Aortic Atherosclerosis (ICD10-I70.0). Electronically Signed   By: Kerby Moors M.D.   On: 12/17/2019 17:47   MR Pelvis Wo Contrast  Result Date: 01/02/2020 CLINICAL DATA:  Newly diagnosed rectal carcinoma. EXAM: MRI PELVIS WITHOUT CONTRAST TECHNIQUE: Multiplanar multisequence MR imaging of the pelvis was performed. No intravenous contrast was administered. Small amount of [place] gel was administered per rectum to optimize tumor evaluation. COMPARISON:  PET-CT on 12/27/2019 FINDINGS: TUMOR LOCATION Tumor distance from Anal Verge/Skin Surface:  4.2 cm Tumor distance to Internal Anal Sphincter: 0 cm TUMOR DESCRIPTION Circumferential Extent: 100% Tumor Length: 9.3 cm T - CATEGORY Extension through Muscularis Propria: Yes. A focal area of extension through the muscularis propria is seen along the left lateral rectal wall in the proximal portion of the  tumor, which measures approximately 4 mm (image 10/8). There is also a focal tumor bulge along the right lateral wall of the distal rectum which measures approximately 7 mm, and appears to involve the right levator ani muscle (image 35/8). (T3c) Shortest Distance of any tumor/node from Mesorectal Fascia: 0 mm from large perirectal lymph nodes along the lateral and posterior aspect of the rectum Extramural Vascular Invasion/Tumor Thrombus: No Invasion of Anterior Peritoneal Reflection: No Involvement of Adjacent Organs or Pelvic Sidewall: No Levator Ani Involvement: Probable involvement of right levator ani muscle along the inferior right lateral rectal wall)  image 18/9 and 35/8). N - CATEGORY Mesorectal Lymph Nodes >=38mm: 4 or more=N2. Large well-circumscribed heterogeneous masses are seen along the left lateral and posterior wall of the rectum, largest measuring 8.7 x 6.5 cm. These are most consistent with markedly enlarged perirectal lymph nodes. Numerous other enlarged lymph nodes are seen throughout the perirectal space. Extra-mesorectal Lymphadenopathy: Yes, in left internal and external iliac chains measuring up to 7 mm = N2 Other:  Sigmoid diverticulosis, without evidence of diverticulitis. IMPRESSION: Rectal adenocarcinoma T stage: T3c; probable involvement of right levator ani muscle also noted Rectal adenocarcinoma N stage: N2; unusual, markedly enlarged left lateral and posterior perirectal lymph nodes noted, largest measuring 8.7 x 6.5 cm, displacing the rectum to the right. Distance from tumor to the internal anal sphincter is 0 cm. Electronically Signed   By: Marlaine Hind M.D.   On: 01/02/2020 08:43   CT Abdomen Pelvis W Contrast  Result Date: 12/17/2019 CLINICAL DATA:  Colorectal carcinoma staging. EXAM: CT CHEST, ABDOMEN, AND PELVIS WITH CONTRAST TECHNIQUE: Multidetector CT imaging of the chest, abdomen and pelvis was performed following the standard protocol during bolus administration of  intravenous contrast. CONTRAST:  121mL OMNIPAQUE IOHEXOL 300 MG/ML  SOLN COMPARISON:  None. FINDINGS: CT CHEST FINDINGS Cardiovascular: The heart size appears within normal limits. Aortic atherosclerosis. Lad, left circumflex coronary artery calcifications. Mediastinum/Nodes: No enlarged mediastinal, hilar, or axillary lymph nodes. Thyroid gland, trachea, and esophagus demonstrate no significant findings. Lungs/Pleura: There is no pleural effusion identified. No airspace consolidation, atelectasis, or pneumothorax. No suspicious lung nodules. Musculoskeletal: No chest wall mass or suspicious bone lesions identified. CT ABDOMEN PELVIS FINDINGS Hepatobiliary: No focal liver abnormality is seen. No gallstones, gallbladder wall thickening, or biliary dilatation. Pancreas: Unremarkable. No pancreatic ductal dilatation or surrounding inflammatory changes. Spleen: Normal in size without focal abnormality. Adrenals/Urinary Tract: Normal appearance of the adrenal glands. The kidneys are unremarkable. No mass or hydronephrosis. Urinary bladder is unremarkable. Stomach/Bowel: Stomach is within normal limits. Appendix appears normal. No evidence of small bowel wall thickening, distention, or inflammatory changes. There is a large mass within the lower anatomic pelvis involving the rectum. This measures 8.8 x 8.7 by 10.3 cm. This has a large, 6.7 cm exophytic component in the left pelvis, image 121/2. Vascular/Lymphatic: Aortic atherosclerosis. Borderline enlarged aortocaval node measures 0.9 cm, image 84/2. No pathologic adenopathy noted within the abdomen however prominent left external iliac node measures 7 mm. Multiple perirectal lymph nodes are identified. The largest is in the left posterior pelvis measuring 1.1 cm, image 117/2. Reproductive: Prostate is unremarkable. Other: Small bilateral fat containing inguinal hernias. No ascites. No focal fluid collections Musculoskeletal: No acute or significant osseous findings.  IMPRESSION: 1. Large mass involving the rectum is identified compatible with primary rectal carcinoma. This has a large, 6.7 cm exophytic component in the left pelvis. Multiple perirectal lymph nodes are identified compatible with metastatic adenopathy. 2. Borderline enlarged aortocaval node measures 0.9 cm. No pathologic adenopathy noted within the abdomen however. Multi vessel coronary artery calcifications noted. 3. Aortic atherosclerosis, in addition to lad and left circumflex coronary artery disease. Please note that although the presence of coronary artery calcium documents the presence of coronary artery disease, the severity of this disease and any potential stenosis cannot be assessed on this non-gated CT examination. Assessment for potential risk factor modification, dietary therapy or pharmacologic therapy may be warranted, if clinically indicated. Aortic Atherosclerosis (ICD10-I70.0). Electronically Signed   By: Kerby Moors M.D.   On: 12/17/2019 17:47   NM  PET Image Initial (PI) Skull Base To Thigh  Result Date: 12/27/2019 CLINICAL DATA:  Initial treatment strategy for rectal carcinoma. EXAM: NUCLEAR MEDICINE PET SKULL BASE TO THIGH TECHNIQUE: 14.2 mCi F-18 FDG was injected intravenously. Full-ring PET imaging was performed from the skull base to thigh after the radiotracer. CT data was obtained and used for attenuation correction and anatomic localization. Fasting blood glucose: 89 mg/dl COMPARISON:  CT on 12/17/2019 FINDINGS: Mediastinal blood-pool activity (background): SUV max = 3.1 Liver activity (reference): SUV max = N/A NECK:  No hypermetabolic lymph nodes or masses. Incidental CT findings:  None. CHEST: No hypermetabolic masses or lymphadenopathy. No suspicious pulmonary nodules seen on CT images. Incidental CT findings:  None. ABDOMEN/PELVIS: No abnormal hypermetabolic activity within the liver, pancreas, adrenal glands, or spleen. Diffuse rectal wall thickening is seen which shows  diffuse hypermetabolism, with SUV max of 28.6. A large hypermetabolic mass is seen involving the left lateral rectal wall, which extends to the posterior mesorectal fascia and to the left pelvic sidewall. This measures 8.6 x 6.8 cm which shows no significant change in size when remeasured in the same planes on prior exam. This mass shows mild peripheral nodular areas of hypermetabolic activity, but shows a large amount central necrosis. Perirectal lymphadenopathy is seen, with largest lymph node measuring 12 mm on image 261/3, stable since previous study. This has SUV max of 8.4. Shotty sub-cm lymph nodes involving the left internal and external iliac chains are seen with low-grade FDG uptake with SUV max measuring up to 1.5. No hypermetabolic lymph nodes seen within the abdomen. Incidental CT findings: Severe sigmoid diverticulosis, without signs of diverticulitis. SKELETON: No focal hypermetabolic bone lesions to suggest skeletal metastasis. Incidental CT findings:  None. IMPRESSION: Diffuse hypermetabolic rectal wall thickening, and large hypermetabolic left perirectal mass extending to the left pelvic sidewall. This is consistent with known rectal carcinoma. Hypermetabolic perirectal lymphadenopathy, consistent with lymph node metastases. Sub-cm left internal and external iliac lymph nodes with low-grade FDG uptake, suspicious for lymph node metastases. No evidence of metastatic disease within the abdomen, chest, or neck. Electronically Signed   By: Marlaine Hind M.D.   On: 12/27/2019 14:17   DG Chest Port 1 View  Result Date: 12/29/2019 CLINICAL DATA:  Postop insertion of Port-A-Cath. EXAM: PORTABLE CHEST 1 VIEW COMPARISON:  12/17/2018 FINDINGS: Interval placement of LEFT-sided port with tip in distal SVC. No pneumothorax. No infiltrate. IMPRESSION: Port placement without complication. Electronically Signed   By: Suzy Bouchard M.D.   On: 12/29/2019 15:10   DG C-Arm 1-60 Min-No Report  Result Date:  12/29/2019 Fluoroscopy was utilized by the requesting physician.  No radiographic interpretation.    ASSESSMENT & PLAN:   Rectal cancer ([place]) #Rectal adenocarcinoma- moderate differentiated; stage III [multiple pelvic lymph nodes]; question borderline 9 mm retroperitoneal neuropathy. PET-bulky rectal mass; multiple lymph nodes noted; no uptake in the retroperitoneal lymph nodes; MRI rectum- T3N2M0;   Pretreatment CEA 94.  Plan TNT [total neoadjuvant therapy]; plan FOLFOX chemotherapy.  #Proceed with cycle #1 of FOLFOX chemotherapy; Labs today reviewed;  acceptable for treatment today.  Again reviewed the potential side effects of chemotherapy including but not limited to nausea vomiting diarrhea hand-foot syndrome; neuropathy.  Discussed with Dr. Tollie Pizza; recommends evaluation with Dr. Audie Clear; for robotic surgery post TNT  #Iron deficient anemia/chronic GI bleed-Hb 10.5- -plan IV iron; Discussed the potential acute infusion reactions with IV iron; which are quite rare.  Patient understands the risk; will proceed with infusions at next visit.Marland Kitchen  DISPOSITION:  Fax papers-cancer policy paper/ [phone] # chemo today; pump off in 3 days #Follow-up in 2 weeks; MD; labs CBC CMP; CBC; Venofer;FOLFOX chemotherapy; pump off in 3 days   All questions were answered. The patient knows to call the clinic with any problems, questions or concerns.    Cammie Sickle, MD 01/04/2020 12:08 PM

## 2020-01-05 ENCOUNTER — Telehealth: Payer: Self-pay

## 2020-01-05 LAB — CEA: CEA: 105 ng/mL — ABNORMAL HIGH (ref 0.0–4.7)

## 2020-01-05 NOTE — Telephone Encounter (Signed)
Telephone call to patient for follow up after receiving first infusion.   Patient states infusion went great.  States eating good and drinking plenty of fluids.   Denies any nausea or vomiting.  Encouraged patient to call for any concerns or questions. 

## 2020-01-06 ENCOUNTER — Inpatient Hospital Stay: Payer: 59

## 2020-01-06 ENCOUNTER — Other Ambulatory Visit: Payer: Self-pay

## 2020-01-06 VITALS — BP 137/87 | HR 78 | Temp 97.0°F | Resp 19

## 2020-01-06 DIAGNOSIS — Z5111 Encounter for antineoplastic chemotherapy: Secondary | ICD-10-CM | POA: Diagnosis not present

## 2020-01-06 DIAGNOSIS — C20 Malignant neoplasm of rectum: Secondary | ICD-10-CM

## 2020-01-06 MED ORDER — HEPARIN SOD (PORK) LOCK FLUSH 100 UNIT/ML IV SOLN
500.0000 [IU] | Freq: Once | INTRAVENOUS | Status: AC | PRN
  Administered 2020-01-06: 500 [IU]
  Filled 2020-01-06: qty 5

## 2020-01-06 MED ORDER — HEPARIN SOD (PORK) LOCK FLUSH 100 UNIT/ML IV SOLN
INTRAVENOUS | Status: AC
  Filled 2020-01-06: qty 5

## 2020-01-06 MED ORDER — SODIUM CHLORIDE 0.9% FLUSH
10.0000 mL | INTRAVENOUS | Status: DC | PRN
  Administered 2020-01-06: 10 mL
  Filled 2020-01-06: qty 10

## 2020-01-10 NOTE — Progress Notes (Signed)
Pharmacist Chemotherapy Monitoring - Follow Up Assessment    I verify that I have reviewed each item in the below checklist:  . Regimen for the patient is scheduled for the appropriate day and plan matches scheduled date. Marland Kitchen Appropriate non-routine labs are ordered dependent on drug ordered. . If applicable, additional medications reviewed and ordered per protocol based on lifetime cumulative doses and/or treatment regimen.   Plan for follow-up and/or issues identified: No . I-vent associated with next due treatment: No . MD and/or nursing notified: No  Martrice Apt K 01/10/2020 8:36 AM

## 2020-01-16 ENCOUNTER — Other Ambulatory Visit: Payer: Self-pay | Admitting: Internal Medicine

## 2020-01-17 ENCOUNTER — Inpatient Hospital Stay (HOSPITAL_BASED_OUTPATIENT_CLINIC_OR_DEPARTMENT_OTHER): Payer: 59 | Admitting: Internal Medicine

## 2020-01-17 ENCOUNTER — Inpatient Hospital Stay: Payer: 59

## 2020-01-17 ENCOUNTER — Encounter: Payer: Self-pay | Admitting: Internal Medicine

## 2020-01-17 ENCOUNTER — Other Ambulatory Visit: Payer: Self-pay

## 2020-01-17 VITALS — BP 133/83 | HR 73 | Temp 96.4°F | Resp 18

## 2020-01-17 VITALS — BP 134/80 | HR 64 | Temp 97.1°F | Ht 73.0 in | Wt 269.0 lb

## 2020-01-17 DIAGNOSIS — Z599 Problem related to housing and economic circumstances, unspecified: Secondary | ICD-10-CM

## 2020-01-17 DIAGNOSIS — Z95828 Presence of other vascular implants and grafts: Secondary | ICD-10-CM

## 2020-01-17 DIAGNOSIS — C20 Malignant neoplasm of rectum: Secondary | ICD-10-CM

## 2020-01-17 DIAGNOSIS — Z5111 Encounter for antineoplastic chemotherapy: Secondary | ICD-10-CM | POA: Diagnosis not present

## 2020-01-17 DIAGNOSIS — D509 Iron deficiency anemia, unspecified: Secondary | ICD-10-CM | POA: Diagnosis not present

## 2020-01-17 DIAGNOSIS — D500 Iron deficiency anemia secondary to blood loss (chronic): Secondary | ICD-10-CM

## 2020-01-17 DIAGNOSIS — R110 Nausea: Secondary | ICD-10-CM

## 2020-01-17 LAB — CBC WITH DIFFERENTIAL/PLATELET
Abs Immature Granulocytes: 0.01 10*3/uL (ref 0.00–0.07)
Basophils Absolute: 0 10*3/uL (ref 0.0–0.1)
Basophils Relative: 1 %
Eosinophils Absolute: 0.3 10*3/uL (ref 0.0–0.5)
Eosinophils Relative: 7 %
HCT: 31.4 % — ABNORMAL LOW (ref 39.0–52.0)
Hemoglobin: 9.7 g/dL — ABNORMAL LOW (ref 13.0–17.0)
Immature Granulocytes: 0 %
Lymphocytes Relative: 26 %
Lymphs Abs: 1 10*3/uL (ref 0.7–4.0)
MCH: 23.1 pg — ABNORMAL LOW (ref 26.0–34.0)
MCHC: 30.9 g/dL (ref 30.0–36.0)
MCV: 74.8 fL — ABNORMAL LOW (ref 80.0–100.0)
Monocytes Absolute: 0.4 10*3/uL (ref 0.1–1.0)
Monocytes Relative: 9 %
Neutro Abs: 2.3 10*3/uL (ref 1.7–7.7)
Neutrophils Relative %: 57 %
Platelets: 227 10*3/uL (ref 150–400)
RBC: 4.2 MIL/uL — ABNORMAL LOW (ref 4.22–5.81)
RDW: 15.6 % — ABNORMAL HIGH (ref 11.5–15.5)
WBC: 4 10*3/uL (ref 4.0–10.5)
nRBC: 0 % (ref 0.0–0.2)

## 2020-01-17 LAB — COMPREHENSIVE METABOLIC PANEL
ALT: 20 U/L (ref 0–44)
AST: 21 U/L (ref 15–41)
Albumin: 3.7 g/dL (ref 3.5–5.0)
Alkaline Phosphatase: 74 U/L (ref 38–126)
Anion gap: 9 (ref 5–15)
BUN: 26 mg/dL — ABNORMAL HIGH (ref 6–20)
CO2: 25 mmol/L (ref 22–32)
Calcium: 8.9 mg/dL (ref 8.9–10.3)
Chloride: 107 mmol/L (ref 98–111)
Creatinine, Ser: 0.97 mg/dL (ref 0.61–1.24)
GFR calc Af Amer: >60 mL/min (ref >60)
GFR calc non Af Amer: >60 mL/min (ref >60)
Glucose, Bld: 110 mg/dL — ABNORMAL HIGH (ref 70–99)
Potassium: 4.3 mmol/L (ref 3.5–5.1)
Sodium: 141 mmol/L (ref 135–145)
Total Bilirubin: 0.5 mg/dL (ref 0.3–1.2)
Total Protein: 7.2 g/dL (ref 6.5–8.1)

## 2020-01-17 MED ORDER — LEUCOVORIN CALCIUM INJECTION 350 MG
1000.0000 mg | Freq: Once | INTRAVENOUS | Status: AC
  Administered 2020-01-17: 1000 mg via INTRAVENOUS
  Filled 2020-01-17: qty 50

## 2020-01-17 MED ORDER — SODIUM CHLORIDE 0.9 % IV SOLN
Freq: Once | INTRAVENOUS | Status: AC
  Filled 2020-01-17: qty 250

## 2020-01-17 MED ORDER — SODIUM CHLORIDE 0.9% FLUSH
10.0000 mL | INTRAVENOUS | Status: DC | PRN
  Administered 2020-01-17: 10 mL via INTRAVENOUS
  Filled 2020-01-17: qty 10

## 2020-01-17 MED ORDER — LORAZEPAM 2 MG/ML IJ SOLN
1.0000 mg | Freq: Once | INTRAMUSCULAR | Status: AC
  Administered 2020-01-17: 1 mg via INTRAVENOUS
  Filled 2020-01-17: qty 1

## 2020-01-17 MED ORDER — DEXTROSE 5 % IV SOLN
Freq: Once | INTRAVENOUS | Status: AC
  Filled 2020-01-17: qty 250

## 2020-01-17 MED ORDER — FLUOROURACIL CHEMO INJECTION 2.5 GM/50ML
400.0000 mg/m2 | Freq: Once | INTRAVENOUS | Status: AC
  Administered 2020-01-17: 1000 mg via INTRAVENOUS
  Filled 2020-01-17: qty 20

## 2020-01-17 MED ORDER — IRON SUCROSE 20 MG/ML IV SOLN
200.0000 mg | Freq: Once | INTRAVENOUS | Status: AC
  Administered 2020-01-17: 200 mg via INTRAVENOUS
  Filled 2020-01-17: qty 10

## 2020-01-17 MED ORDER — SODIUM CHLORIDE 0.9 % IV SOLN
10.0000 mg | Freq: Once | INTRAVENOUS | Status: AC
  Administered 2020-01-17: 10 mg via INTRAVENOUS
  Filled 2020-01-17: qty 10

## 2020-01-17 MED ORDER — PALONOSETRON HCL INJECTION 0.25 MG/5ML
0.2500 mg | Freq: Once | INTRAVENOUS | Status: AC
  Administered 2020-01-17: 0.25 mg via INTRAVENOUS
  Filled 2020-01-17: qty 5

## 2020-01-17 MED ORDER — SODIUM CHLORIDE 0.9 % IV SOLN: 200.0000 mg | Freq: Once | INTRAVENOUS | Status: DC

## 2020-01-17 MED ORDER — OXALIPLATIN CHEMO INJECTION 100 MG/20ML
200.0000 mg | Freq: Once | INTRAVENOUS | Status: AC
  Administered 2020-01-17: 200 mg via INTRAVENOUS
  Filled 2020-01-17: qty 40

## 2020-01-17 MED ORDER — SODIUM CHLORIDE 0.9 % IV SOLN
2400.0000 mg/m2 | INTRAVENOUS | Status: DC
  Administered 2020-01-17: 6050 mg via INTRAVENOUS
  Filled 2020-01-17: qty 121

## 2020-01-17 NOTE — Assessment & Plan Note (Addendum)
#  Rectal adenocarcinoma- moderate differentiated; stage III [multiple pelvic lymph nodes]; question borderline 9 mm retroperitoneal neuropathy. PET-bulky rectal mass; multiple lymph nodes noted; no uptake in the retroperitoneal lymph nodes; MRI rectum- T3N2M0;   Pretreatment CEA 94.  Currently on TNT [total neoadjuvant therapy]; currently on FOLFOX chemotherapy.  #Proceed with cycle #2 of FOLFOX chemotherapy; Labs today reviewed;  acceptable for treatment today.  #Iron deficient anemia/chronic GI bleed-Hb 9.5- -plan IV iron today  # peri-rectal pain- wants to try hemp; concerned about narcotic addiction.   DISPOSITION:  # Social work referral # chemo today; Venofer ; pump off in 2 days #Follow-up in 2 weeks; MD; labs CBC CMP; CBC; Venofer;FOLFOX chemotherapy; pump off in 2 days

## 2020-01-17 NOTE — Progress Notes (Signed)
1115: Patient states he has extreme nausea. Denies any other symptoms. Was receiving Venofer at the time which was almost completed with some residue left in line. Dr. Rogue Bussing and team made aware.   1119: VS Stable. Patient reports he felt much better after removing mask in bathroom for better ventilation. Denies any vomiting.

## 2020-01-17 NOTE — Progress Notes (Signed)
[facility] CONSULT NOTE  Patient Care Team: Volney American, PA-C as PCP - General (Family Medicine) Clent Jacks, RN as Oncology Nurse Navigator [name], MD as Consulting Physician (Gastroenterology) Cammie Sickle, MD as Consulting Physician (Internal Medicine)  CHIEF COMPLAINTS/PURPOSE OF CONSULTATION: Rectal cancer  #  Oncology History Overview Note  # MAY 2021-rectal adenocarcinoma ; moderately differentiated; [Dr.Anna]; colonoscopy-partially obstructing circumferential rectal mass-starting at anal verge to proximal. CEA- 47. CT C/A/P- Large mass involving the rectum is identified compatible with primary rectal carcinoma. This has a large, 6.7 cm exophytic component in the left pelvis. Multiple perirectal lymph nodes are identified compatible with metastatic adenopathy; Borderline enlarged aortocaval node measures 0.9 cm; PET-bulky rectal mass; multiple lymph nodes noted; no uptake in the retroperitoneal lymph nodes; MRI rectum- T3N2M0;   Pretreatment CEA 94; TNT  # June 1st 2021- FOLFOX  # SURVIVORSHIP:   # GENETICS:   DIAGNOSIS: RECTAL CA  STAGE:   III     ;  GOALS: cure  CURRENT/MOST RECENT THERAPY : FOLFOX [C]   Rectal cancer ([place])  12/20/2019 Initial Diagnosis   Rectal cancer ([place])   01/04/2020 -  Chemotherapy   The patient had dexamethasone (DECADRON) 4 MG tablet, 8 mg, Oral, Daily, 1 of 1 cycle, Start date: --, End date: -- palonosetron (ALOXI) injection 0.25 mg, 0.25 mg, Intravenous,  Once, 1 of 8 cycles Administration: 0.25 mg (01/04/2020) leucovorin 1,000 mg in dextrose 5 % 250 mL infusion, 1,008 mg, Intravenous,  Once, 1 of 8 cycles Administration: 1,000 mg (01/04/2020) oxaliplatin (ELOXATIN) 200 mg in dextrose 5 % 500 mL chemo infusion, 215 mg, Intravenous,  Once, 1 of 8 cycles Administration: 200 mg (01/04/2020) fluorouracil (ADRUCIL) chemo injection 1,000 mg, 400 mg/m2 = 1,000 mg, Intravenous,  Once, 1 of 8  cycles Administration: 1,000 mg (01/04/2020) fluorouracil (ADRUCIL) 6,050 mg in sodium chloride 0.9 % 129 mL chemo infusion, 2,400 mg/m2 = 6,050 mg, Intravenous, 1 Day/Dose, 1 of 8 cycles Administration: 6,050 mg (01/04/2020)  for chemotherapy treatment.       HISTORY OF PRESENTING ILLNESS:  Stephen Vasquez 59 y.o.  male newly diagnosed stage III rectal cancer currently on TNT-FOLFOX chemotherapy is here for follow-up.  Patient underwent cycle #1 of chemotherapy 2 weeks ago.  Tolerated fairly well except for taste alteration.  Complains of perirectal pain; however concerned about narcotic addiction.  Wants to try hemp oil first.  Continues to have intermittent blood in stools.  Otherwise not any worse.  No significant nausea vomiting or diarrhea.   Review of Systems  Constitutional: Negative for chills, diaphoresis, fever, malaise/fatigue and weight loss.  HENT: Negative for nosebleeds and sore throat.   Eyes: Negative for double vision.  Respiratory: Negative for cough, hemoptysis, sputum production, shortness of breath and wheezing.   Cardiovascular: Negative for chest pain, palpitations, orthopnea and leg swelling.  Gastrointestinal: Positive for blood in stool. Negative for abdominal pain, constipation, diarrhea, heartburn, melena, nausea and vomiting.  Genitourinary: Negative for dysuria, frequency and urgency.  Musculoskeletal: Positive for joint pain. Negative for back pain.  Skin: Negative.  Negative for itching and rash.  Neurological: Negative for dizziness, tingling, focal weakness, weakness and headaches.  Endo/Heme/Allergies: Does not bruise/bleed easily.  Psychiatric/Behavioral: Negative for depression. The patient is not nervous/anxious and does not have insomnia.      MEDICAL HISTORY:  Past Medical History:  Diagnosis Date  . Allergic rhinitis   . Heart murmur   . Rectal cancer ([place])  SURGICAL HISTORY: Past Surgical History:  Procedure Laterality Date  .  COLONOSCOPY WITH PROPOFOL N/A 12/15/2019   Procedure: COLONOSCOPY WITH PROPOFOL;  Surgeon: [name], MD;  Location: [facility] ENDOSCOPY;  Service: Gastroenterology;  Laterality: N/A;  . PORTACATH PLACEMENT Left 12/29/2019   Procedure: INSERTION PORT-A-CATH;  Surgeon: Robert Bellow, MD;  Location: [facility] ORS;  Service: General;  Laterality: Left;    SOCIAL HISTORY: Social History   Socioeconomic History  . Marital status: Married    Spouse name: Not on file  . Number of children: Not on file  . Years of education: Not on file  . Highest education level: Not on file  Occupational History  . Not on file  Tobacco Use  . Smoking status: Former Research scientist (life sciences)  . Smokeless tobacco: Never Used  Vaping Use  . Vaping Use: Never used  Substance and Sexual Activity  . Alcohol use: Not Currently  . Drug use: Never  . Sexual activity: Yes  Other Topics Concern  . Not on file  Social History Narrative   Lives in [place]; Freight forwarder at [employer]; 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 2 children- in boy and girls in late 49s.    Social Determinants of Health   Financial Resource Strain:   . Difficulty of Paying Living Expenses:   Food Insecurity:   . Worried About Charity fundraiser in the Last Year:   . Arboriculturist in the Last Year:   Transportation Needs:   . Film/video editor (Medical):   Marland Kitchen Lack of Transportation (Non-Medical):   Physical Activity:   . Days of Exercise per Week:   . Minutes of Exercise per Session:   Stress:   . Feeling of Stress :   Social Connections:   . Frequency of Communication with Friends and Family:   . Frequency of Social Gatherings with Friends and Family:   . Attends Religious Services:   . Active Member of Clubs or Organizations:   . Attends Archivist Meetings:   Marland Kitchen Marital Status:   Intimate Partner Violence:   . Fear of Current or Ex-Partner:   . Emotionally Abused:   Marland Kitchen Physically Abused:   . Sexually Abused:     FAMILY  HISTORY: Family History  Problem Relation Age of Onset  . Hypertension Mother   . Hypertension Father   . Stomach cancer Father        in 75s- survived.   Marland Kitchen Heart disease Brother     ALLERGIES:  has No Known Allergies.  MEDICATIONS:  Current Outpatient Medications  Medication Sig Dispense Refill  . azelastine (ASTELIN) 0.1 % nasal spray Place 2 sprays into both nostrils 2 (two) times daily. Use in each nostril as directed 30 mL 12  . lidocaine-prilocaine (EMLA) cream Apply 1 application topically as needed. (Patient taking differently: Apply 1 application topically as needed (port access). ) 30 g 0  . montelukast (SINGULAIR) 10 MG tablet Take 1 tablet (10 mg total) by mouth at bedtime. 30 tablet 3  . naproxen sodium (ALEVE) 220 MG tablet Take 220 mg by mouth 2 (two) times daily as needed (pain).    . ondansetron (ZOFRAN) 8 MG tablet One pill every 8 hours as needed for nausea/vomitting. (Patient taking differently: Take 8 mg by mouth every 8 (eight) hours as needed for nausea or vomiting. ) 40 tablet 1  . oxymetazoline (AFRIN) 0.05 % nasal spray Place 1 spray into both nostrils 2 (two) times daily as  needed for congestion.    . prochlorperazine (COMPAZINE) 10 MG tablet Take 1 tablet (10 mg total) by mouth every 6 (six) hours as needed for nausea or vomiting. 40 tablet 1  . dicyclomine (BENTYL) 10 MG capsule Take 1 capsule (10 mg total) by mouth 4 (four) times daily -  before meals and at bedtime. (Patient not taking: Reported on 01/17/2020) 120 capsule 1  . Probiotic CAPS Take 1 capsule by mouth daily. (Patient not taking: Reported on 01/17/2020)     No current facility-administered medications for this visit.      Marland Kitchen  PHYSICAL EXAMINATION: ECOG PERFORMANCE STATUS: 1 - Symptomatic but completely ambulatory  There were no vitals filed for this visit. Filed Weights   01/17/20 0851  Weight: 269 lb (122 kg)    Physical Exam HENT:     Head: Normocephalic and atraumatic.      Mouth/Throat:     Pharynx: No oropharyngeal exudate.  Eyes:     Pupils: Pupils are equal, round, and reactive to light.  Cardiovascular:     Rate and Rhythm: Normal rate and regular rhythm.  Pulmonary:     Effort: Pulmonary effort is normal. No respiratory distress.     Breath sounds: Normal breath sounds. No wheezing.  Abdominal:     General: Bowel sounds are normal. There is no distension.     Palpations: Abdomen is soft. There is no mass.     Tenderness: There is no abdominal tenderness. There is no guarding or rebound.  Musculoskeletal:        General: No tenderness. Normal range of motion.     Cervical back: Normal range of motion and neck supple.  Skin:    General: Skin is warm.  Neurological:     Mental Status: He is alert and oriented to person, place, and time.  Psychiatric:        Mood and Affect: Affect normal.      LABORATORY DATA:  I have reviewed the data as listed Lab Results  Component Value Date   WBC 4.0 01/17/2020   HGB 9.7 (L) 01/17/2020   HCT 31.4 (L) 01/17/2020   MCV 74.8 (L) 01/17/2020   PLT 227 01/17/2020   Recent Labs    12/20/19 0952 01/04/20 0835 01/17/20 0817  NA 142 139 141  K 4.4 4.1 4.3  CL 107 107 107  CO2 28 26 25   GLUCOSE 103* 96 110*  BUN 20 20 26*  CREATININE 1.08 0.89 0.97  CALCIUM 9.1 8.7* 8.9  GFRNONAA >60 >60 >60  GFRAA >60 >60 >60  PROT 7.6 7.6 7.2  ALBUMIN 3.7 3.9 3.7  AST 15 16 21   ALT 13 13 20   ALKPHOS 83 78 74  BILITOT 0.4 0.5 0.5    RADIOGRAPHIC STUDIES: I have personally reviewed the radiological images as listed and agreed with the findings in the report. MR Pelvis Wo Contrast  Result Date: 01/02/2020 CLINICAL DATA:  Newly diagnosed rectal carcinoma. EXAM: MRI PELVIS WITHOUT CONTRAST TECHNIQUE: Multiplanar multisequence MR imaging of the pelvis was performed. No intravenous contrast was administered. Small amount of [place] gel was administered per rectum to optimize tumor evaluation. COMPARISON:  PET-CT on  12/27/2019 FINDINGS: TUMOR LOCATION Tumor distance from Anal Verge/Skin Surface:  4.2 cm Tumor distance to Internal Anal Sphincter: 0 cm TUMOR DESCRIPTION Circumferential Extent: 100% Tumor Length: 9.3 cm T - CATEGORY Extension through Muscularis Propria: Yes. A focal area of extension through the muscularis propria is seen along the left lateral rectal  wall in the proximal portion of the tumor, which measures approximately 4 mm (image 10/8). There is also a focal tumor bulge along the right lateral wall of the distal rectum which measures approximately 7 mm, and appears to involve the right levator ani muscle (image 35/8). (T3c) Shortest Distance of any tumor/node from Mesorectal Fascia: 0 mm from large perirectal lymph nodes along the lateral and posterior aspect of the rectum Extramural Vascular Invasion/Tumor Thrombus: No Invasion of Anterior Peritoneal Reflection: No Involvement of Adjacent Organs or Pelvic Sidewall: No Levator Ani Involvement: Probable involvement of right levator ani muscle along the inferior right lateral rectal wall) image 18/9 and 35/8). N - CATEGORY Mesorectal Lymph Nodes >=36mm: 4 or more=N2. Large well-circumscribed heterogeneous masses are seen along the left lateral and posterior wall of the rectum, largest measuring 8.7 x 6.5 cm. These are most consistent with markedly enlarged perirectal lymph nodes. Numerous other enlarged lymph nodes are seen throughout the perirectal space. Extra-mesorectal Lymphadenopathy: Yes, in left internal and external iliac chains measuring up to 7 mm = N2 Other:  Sigmoid diverticulosis, without evidence of diverticulitis. IMPRESSION: Rectal adenocarcinoma T stage: T3c; probable involvement of right levator ani muscle also noted Rectal adenocarcinoma N stage: N2; unusual, markedly enlarged left lateral and posterior perirectal lymph nodes noted, largest measuring 8.7 x 6.5 cm, displacing the rectum to the right. Distance from tumor to the internal anal  sphincter is 0 cm. Electronically Signed   By: Marlaine Hind M.D.   On: 01/02/2020 08:43   NM PET Image Initial (PI) Skull Base To Thigh  Result Date: 12/27/2019 CLINICAL DATA:  Initial treatment strategy for rectal carcinoma. EXAM: NUCLEAR MEDICINE PET SKULL BASE TO THIGH TECHNIQUE: 14.2 mCi F-18 FDG was injected intravenously. Full-ring PET imaging was performed from the skull base to thigh after the radiotracer. CT data was obtained and used for attenuation correction and anatomic localization. Fasting blood glucose: 89 mg/dl COMPARISON:  CT on 12/17/2019 FINDINGS: Mediastinal blood-pool activity (background): SUV max = 3.1 Liver activity (reference): SUV max = N/A NECK:  No hypermetabolic lymph nodes or masses. Incidental CT findings:  None. CHEST: No hypermetabolic masses or lymphadenopathy. No suspicious pulmonary nodules seen on CT images. Incidental CT findings:  None. ABDOMEN/PELVIS: No abnormal hypermetabolic activity within the liver, pancreas, adrenal glands, or spleen. Diffuse rectal wall thickening is seen which shows diffuse hypermetabolism, with SUV max of 28.6. A large hypermetabolic mass is seen involving the left lateral rectal wall, which extends to the posterior mesorectal fascia and to the left pelvic sidewall. This measures 8.6 x 6.8 cm which shows no significant change in size when remeasured in the same planes on prior exam. This mass shows mild peripheral nodular areas of hypermetabolic activity, but shows a large amount central necrosis. Perirectal lymphadenopathy is seen, with largest lymph node measuring 12 mm on image 261/3, stable since previous study. This has SUV max of 8.4. Shotty sub-cm lymph nodes involving the left internal and external iliac chains are seen with low-grade FDG uptake with SUV max measuring up to 1.5. No hypermetabolic lymph nodes seen within the abdomen. Incidental CT findings: Severe sigmoid diverticulosis, without signs of diverticulitis. SKELETON: No focal  hypermetabolic bone lesions to suggest skeletal metastasis. Incidental CT findings:  None. IMPRESSION: Diffuse hypermetabolic rectal wall thickening, and large hypermetabolic left perirectal mass extending to the left pelvic sidewall. This is consistent with known rectal carcinoma. Hypermetabolic perirectal lymphadenopathy, consistent with lymph node metastases. Sub-cm left internal and external iliac lymph nodes with  low-grade FDG uptake, suspicious for lymph node metastases. No evidence of metastatic disease within the abdomen, chest, or neck. Electronically Signed   By: Marlaine Hind M.D.   On: 12/27/2019 14:17   DG Chest Port 1 View  Result Date: 12/29/2019 CLINICAL DATA:  Postop insertion of Port-A-Cath. EXAM: PORTABLE CHEST 1 VIEW COMPARISON:  12/17/2018 FINDINGS: Interval placement of LEFT-sided port with tip in distal SVC. No pneumothorax. No infiltrate. IMPRESSION: Port placement without complication. Electronically Signed   By: Suzy Bouchard M.D.   On: 12/29/2019 15:10   DG C-Arm 1-60 Min-No Report  Result Date: 12/29/2019 Fluoroscopy was utilized by the requesting physician.  No radiographic interpretation.    ASSESSMENT & PLAN:   Rectal cancer ([place]) #Rectal adenocarcinoma- moderate differentiated; stage III [multiple pelvic lymph nodes]; question borderline 9 mm retroperitoneal neuropathy. PET-bulky rectal mass; multiple lymph nodes noted; no uptake in the retroperitoneal lymph nodes; MRI rectum- T3N2M0;   Pretreatment CEA 94.  Currently on TNT [total neoadjuvant therapy]; currently on FOLFOX chemotherapy.  #Proceed with cycle #2 of FOLFOX chemotherapy; Labs today reviewed;  acceptable for treatment today.  #Iron deficient anemia/chronic GI bleed-Hb 9.5- -plan IV iron today  # peri-rectal pain- wants to try hemp; concerned about narcotic addiction.   DISPOSITION:  # Social work referral # chemo today; Venofer ; pump off in 2 days #Follow-up in 2 weeks; MD; labs CBC CMP; CBC;  Venofer;FOLFOX chemotherapy; pump off in 2 days   All questions were answered. The patient knows to call the clinic with any problems, questions or concerns.    Cammie Sickle, MD 01/17/2020 9:21 AM

## 2020-01-18 NOTE — Progress Notes (Signed)
PSN spoke with patient today.  Patient stated that he is continuing to work, but realizes that it is not the best option because he is around a lot of people on any given day.  PSN spoke with patient about disability through his employer.  Patient stated that he did not take out short or long term disability.  Patient not interested in applying for Social Security disability at this time.  PSN gave patient information about the funds available to assist him through the charitable foundation.  Patient stated that he would call if he needed any assistance from these funds.

## 2020-01-19 ENCOUNTER — Other Ambulatory Visit: Payer: Self-pay

## 2020-01-19 ENCOUNTER — Inpatient Hospital Stay: Payer: 59

## 2020-01-19 VITALS — BP 152/88 | HR 83 | Temp 97.0°F | Resp 19

## 2020-01-19 DIAGNOSIS — C20 Malignant neoplasm of rectum: Secondary | ICD-10-CM

## 2020-01-19 DIAGNOSIS — Z5111 Encounter for antineoplastic chemotherapy: Secondary | ICD-10-CM | POA: Diagnosis not present

## 2020-01-19 MED ORDER — SODIUM CHLORIDE 0.9% FLUSH
10.0000 mL | INTRAVENOUS | Status: DC | PRN
  Administered 2020-01-19: 10 mL
  Filled 2020-01-19: qty 10

## 2020-01-19 MED ORDER — HEPARIN SOD (PORK) LOCK FLUSH 100 UNIT/ML IV SOLN
500.0000 [IU] | Freq: Once | INTRAVENOUS | Status: AC | PRN
  Administered 2020-01-19: 500 [IU]
  Filled 2020-01-19: qty 5

## 2020-01-31 ENCOUNTER — Encounter: Payer: Self-pay | Admitting: Internal Medicine

## 2020-01-31 ENCOUNTER — Inpatient Hospital Stay: Payer: 59

## 2020-01-31 ENCOUNTER — Other Ambulatory Visit: Payer: Self-pay

## 2020-01-31 ENCOUNTER — Inpatient Hospital Stay (HOSPITAL_BASED_OUTPATIENT_CLINIC_OR_DEPARTMENT_OTHER): Payer: 59 | Admitting: Internal Medicine

## 2020-01-31 ENCOUNTER — Other Ambulatory Visit: Payer: Self-pay | Admitting: Internal Medicine

## 2020-01-31 DIAGNOSIS — C20 Malignant neoplasm of rectum: Secondary | ICD-10-CM

## 2020-01-31 DIAGNOSIS — Z5111 Encounter for antineoplastic chemotherapy: Secondary | ICD-10-CM | POA: Diagnosis not present

## 2020-01-31 DIAGNOSIS — D500 Iron deficiency anemia secondary to blood loss (chronic): Secondary | ICD-10-CM

## 2020-01-31 LAB — CBC WITH DIFFERENTIAL/PLATELET
Abs Immature Granulocytes: 0.01 10*3/uL (ref 0.00–0.07)
Basophils Absolute: 0 10*3/uL (ref 0.0–0.1)
Basophils Relative: 1 %
Eosinophils Absolute: 0 10*3/uL (ref 0.0–0.5)
Eosinophils Relative: 1 %
HCT: 33.3 % — ABNORMAL LOW (ref 39.0–52.0)
Hemoglobin: 10.5 g/dL — ABNORMAL LOW (ref 13.0–17.0)
Immature Granulocytes: 0 %
Lymphocytes Relative: 56 %
Lymphs Abs: 1.5 10*3/uL (ref 0.7–4.0)
MCH: 23.8 pg — ABNORMAL LOW (ref 26.0–34.0)
MCHC: 31.5 g/dL (ref 30.0–36.0)
MCV: 75.3 fL — ABNORMAL LOW (ref 80.0–100.0)
Monocytes Absolute: 0.4 10*3/uL (ref 0.1–1.0)
Monocytes Relative: 16 %
Neutro Abs: 0.7 10*3/uL — ABNORMAL LOW (ref 1.7–7.7)
Neutrophils Relative %: 26 %
Platelets: 230 10*3/uL (ref 150–400)
RBC: 4.42 MIL/uL (ref 4.22–5.81)
RDW: 17.4 % — ABNORMAL HIGH (ref 11.5–15.5)
WBC: 2.6 10*3/uL — ABNORMAL LOW (ref 4.0–10.5)
nRBC: 0 % (ref 0.0–0.2)

## 2020-01-31 LAB — COMPREHENSIVE METABOLIC PANEL
ALT: 30 U/L (ref 0–44)
AST: 30 U/L (ref 15–41)
Albumin: 3.9 g/dL (ref 3.5–5.0)
Alkaline Phosphatase: 91 U/L (ref 38–126)
Anion gap: 9 (ref 5–15)
BUN: 21 mg/dL — ABNORMAL HIGH (ref 6–20)
CO2: 25 mmol/L (ref 22–32)
Calcium: 8.9 mg/dL (ref 8.9–10.3)
Chloride: 106 mmol/L (ref 98–111)
Creatinine, Ser: 1.15 mg/dL (ref 0.61–1.24)
GFR calc Af Amer: >60 mL/min (ref >60)
GFR calc non Af Amer: >60 mL/min (ref >60)
Glucose, Bld: 107 mg/dL — ABNORMAL HIGH (ref 70–99)
Potassium: 3.8 mmol/L (ref 3.5–5.1)
Sodium: 140 mmol/L (ref 135–145)
Total Bilirubin: 0.6 mg/dL (ref 0.3–1.2)
Total Protein: 7.7 g/dL (ref 6.5–8.1)

## 2020-01-31 MED ORDER — IRON SUCROSE 20 MG/ML IV SOLN
200.0000 mg | Freq: Once | INTRAVENOUS | Status: AC
  Administered 2020-01-31: 200 mg via INTRAVENOUS

## 2020-01-31 MED ORDER — OXALIPLATIN CHEMO INJECTION 100 MG/20ML
200.0000 mg | Freq: Once | INTRAVENOUS | Status: AC
  Administered 2020-01-31: 200 mg via INTRAVENOUS
  Filled 2020-01-31: qty 40

## 2020-01-31 MED ORDER — SODIUM CHLORIDE 0.9 % IV SOLN
10.0000 mg | Freq: Once | INTRAVENOUS | Status: AC
  Administered 2020-01-31: 10 mg via INTRAVENOUS
  Filled 2020-01-31: qty 10

## 2020-01-31 MED ORDER — SODIUM CHLORIDE 0.9% FLUSH
10.0000 mL | Freq: Once | INTRAVENOUS | Status: AC
  Administered 2020-01-31: 10 mL via INTRAVENOUS
  Filled 2020-01-31: qty 10

## 2020-01-31 MED ORDER — SODIUM CHLORIDE 0.9 % IV SOLN
2400.0000 mg/m2 | INTRAVENOUS | Status: DC
  Administered 2020-01-31: 6050 mg via INTRAVENOUS
  Filled 2020-01-31: qty 121

## 2020-01-31 MED ORDER — HEPARIN SOD (PORK) LOCK FLUSH 100 UNIT/ML IV SOLN
500.0000 [IU] | Freq: Once | INTRAVENOUS | Status: DC
  Filled 2020-01-31: qty 5

## 2020-01-31 MED ORDER — DEXTROSE 5 % IV SOLN
Freq: Once | INTRAVENOUS | Status: AC
  Filled 2020-01-31: qty 250

## 2020-01-31 MED ORDER — HYDROCODONE-ACETAMINOPHEN 5-325 MG PO TABS: 1.0000 | ORAL_TABLET | Freq: Three times a day (TID) | ORAL | 0 refills | Status: DC | PRN

## 2020-01-31 MED ORDER — SODIUM CHLORIDE 0.9 % IV SOLN: 200.0000 mg | Freq: Once | INTRAVENOUS | Status: DC

## 2020-01-31 MED ORDER — PALONOSETRON HCL INJECTION 0.25 MG/5ML
0.2500 mg | Freq: Once | INTRAVENOUS | Status: AC
  Administered 2020-01-31: 0.25 mg via INTRAVENOUS
  Filled 2020-01-31: qty 5

## 2020-01-31 MED ORDER — SODIUM CHLORIDE 0.9 % IV SOLN
Freq: Once | INTRAVENOUS | Status: AC
  Filled 2020-01-31: qty 250

## 2020-01-31 MED ORDER — LEUCOVORIN CALCIUM INJECTION 350 MG
1000.0000 mg | Freq: Once | INTRAVENOUS | Status: AC
  Administered 2020-01-31: 1000 mg via INTRAVENOUS
  Filled 2020-01-31: qty 50

## 2020-01-31 NOTE — Progress Notes (Signed)
[facility] CONSULT NOTE  Patient Care Team: Volney American, PA-C as PCP - General (Family Medicine) Clent Jacks, RN as Oncology Nurse Navigator [name], MD as Consulting Physician (Gastroenterology) Cammie Sickle, MD as Consulting Physician (Internal Medicine)  CHIEF COMPLAINTS/PURPOSE OF CONSULTATION: Rectal cancer  #  Oncology History Overview Note  # MAY 2021-rectal adenocarcinoma ; moderately differentiated; [Dr.Anna]; colonoscopy-partially obstructing circumferential rectal mass-starting at anal verge to proximal. CEA- 53. CT C/A/P- Large mass involving the rectum is identified compatible with primary rectal carcinoma. This has a large, 6.7 cm exophytic component in the left pelvis. Multiple perirectal lymph nodes are identified compatible with metastatic adenopathy; Borderline enlarged aortocaval node measures 0.9 cm; PET-bulky rectal mass; multiple lymph nodes noted; no uptake in the retroperitoneal lymph nodes; MRI rectum- T3N2M0;   Pretreatment CEA 94; TNT  # June 1st 2021- FOLFOX  # SURVIVORSHIP:   # GENETICS:   DIAGNOSIS: RECTAL CA  STAGE:   III     ;  GOALS: cure  CURRENT/MOST RECENT THERAPY : FOLFOX [C]   Rectal cancer ([place])  12/20/2019 Initial Diagnosis   Rectal cancer ([place])   01/04/2020 -  Chemotherapy   The patient had dexamethasone (DECADRON) 4 MG tablet, 8 mg, Oral, Daily, 1 of 1 cycle, Start date: --, End date: -- palonosetron (ALOXI) injection 0.25 mg, 0.25 mg, Intravenous,  Once, 3 of 8 cycles Administration: 0.25 mg (01/04/2020), 0.25 mg (01/17/2020) pegfilgrastim-jmdb (FULPHILA) injection 6 mg, 6 mg, Subcutaneous,  Once, 1 of 6 cycles leucovorin 1,000 mg in dextrose 5 % 250 mL infusion, 1,008 mg, Intravenous,  Once, 3 of 8 cycles Administration: 1,000 mg (01/04/2020), 1,000 mg (01/17/2020) oxaliplatin (ELOXATIN) 200 mg in dextrose 5 % 500 mL chemo infusion, 215 mg, Intravenous,  Once, 3 of 8 cycles Administration: 200 mg  (01/04/2020), 200 mg (01/17/2020) fluorouracil (ADRUCIL) chemo injection 1,000 mg, 400 mg/m2 = 1,000 mg, Intravenous,  Once, 2 of 2 cycles Administration: 1,000 mg (01/04/2020), 1,000 mg (01/17/2020) fluorouracil (ADRUCIL) 6,050 mg in sodium chloride 0.9 % 129 mL chemo infusion, 2,400 mg/m2 = 6,050 mg, Intravenous, 1 Day/Dose, 3 of 8 cycles Administration: 6,050 mg (01/04/2020), 6,050 mg (01/17/2020)  for chemotherapy treatment.       HISTORY OF PRESENTING ILLNESS:  Stephen Vasquez 59 y.o.  male newly diagnosed stage III rectal cancer currently on TNT-FOLFOX chemotherapy is here for follow-up.  Patient underwent cycle #2 of chemotherapy 2 weeks ago.    Patient noted to have nosebleeds last for about 10 minutes; mostly with the blowing the nose. Resolved spontaneously.  Patient had episodes of difficulty with coordination with cleaning his firearms x2 episodes; that resolved.  Otherwise, no difficulty with his other chores.  He is currently at home.  Continues to have perirectal pain; also intermittent blood in stools.  Patient's pain is not controlled on hemp.   Review of Systems  Constitutional: Negative for chills, diaphoresis, fever, malaise/fatigue and weight loss.  HENT: Negative for nosebleeds and sore throat.   Eyes: Negative for double vision.  Respiratory: Negative for cough, hemoptysis, sputum production, shortness of breath and wheezing.   Cardiovascular: Negative for chest pain, palpitations, orthopnea and leg swelling.  Gastrointestinal: Positive for blood in stool. Negative for abdominal pain, constipation, diarrhea, heartburn, melena, nausea and vomiting.  Genitourinary: Negative for dysuria, frequency and urgency.  Musculoskeletal: Positive for joint pain. Negative for back pain.  Skin: Negative.  Negative for itching and rash.  Neurological: Negative for dizziness, tingling, focal weakness, weakness and  headaches.  Endo/Heme/Allergies: Does not bruise/bleed easily.   Psychiatric/Behavioral: Negative for depression. The patient is not nervous/anxious and does not have insomnia.      MEDICAL HISTORY:  Past Medical History:  Diagnosis Date  . Allergic rhinitis   . Heart murmur   . Rectal cancer [facility])     SURGICAL HISTORY: Past Surgical History:  Procedure Laterality Date  . COLONOSCOPY WITH PROPOFOL N/A 12/15/2019   Procedure: COLONOSCOPY WITH PROPOFOL;  Surgeon: [name], MD;  Location: [facility] ENDOSCOPY;  Service: Gastroenterology;  Laterality: N/A;  . PORTACATH PLACEMENT Left 12/29/2019   Procedure: INSERTION PORT-A-CATH;  Surgeon: Robert Bellow, MD;  Location: [facility] ORS;  Service: General;  Laterality: Left;    SOCIAL HISTORY: Social History   Socioeconomic History  . Marital status: Married    Spouse name: Not on file  . Number of children: Not on file  . Years of education: Not on file  . Highest education level: Not on file  Occupational History  . Not on file  Tobacco Use  . Smoking status: Former Research scientist (life sciences)  . Smokeless tobacco: Never Used  Vaping Use  . Vaping Use: Never used  Substance and Sexual Activity  . Alcohol use: Not Currently  . Drug use: Never  . Sexual activity: Yes  Other Topics Concern  . Not on file  Social History Narrative   Lives in [place]; Freight forwarder at [employer]; 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 2 children- in boy and girls in late 22s.    Social Determinants of Health   Financial Resource Strain:   . Difficulty of Paying Living Expenses:   Food Insecurity:   . Worried About Charity fundraiser in the Last Year:   . Arboriculturist in the Last Year:   Transportation Needs:   . Film/video editor (Medical):   Marland Kitchen Lack of Transportation (Non-Medical):   Physical Activity:   . Days of Exercise per Week:   . Minutes of Exercise per Session:   Stress:   . Feeling of Stress :   Social Connections:   . Frequency of Communication with Friends and Family:   . Frequency of Social Gatherings  with Friends and Family:   . Attends Religious Services:   . Active Member of Clubs or Organizations:   . Attends Archivist Meetings:   Marland Kitchen Marital Status:   Intimate Partner Violence:   . Fear of Current or Ex-Partner:   . Emotionally Abused:   Marland Kitchen Physically Abused:   . Sexually Abused:     FAMILY HISTORY: Family History  Problem Relation Age of Onset  . Hypertension Mother   . Hypertension Father   . Stomach cancer Father        in 74s- survived.   Marland Kitchen Heart disease Brother     ALLERGIES:  has No Known Allergies.  MEDICATIONS:  Current Outpatient Medications  Medication Sig Dispense Refill  . azelastine (ASTELIN) 0.1 % nasal spray Place 2 sprays into both nostrils 2 (two) times daily. Use in each nostril as directed 30 mL 12  . lidocaine-prilocaine (EMLA) cream Apply 1 application topically as needed. (Patient taking differently: Apply 1 application topically as needed (port access). ) 30 g 0  . montelukast (SINGULAIR) 10 MG tablet Take 1 tablet (10 mg total) by mouth at bedtime. 30 tablet 3  . naproxen sodium (ALEVE) 220 MG tablet Take 220 mg by mouth 2 (two) times daily as needed (pain).    Marland Kitchen  ondansetron (ZOFRAN) 8 MG tablet One pill every 8 hours as needed for nausea/vomitting. 40 tablet 1  . oxymetazoline (AFRIN) 0.05 % nasal spray Place 1 spray into both nostrils 2 (two) times daily as needed for congestion.    . prochlorperazine (COMPAZINE) 10 MG tablet Take 1 tablet (10 mg total) by mouth every 6 (six) hours as needed for nausea or vomiting. 40 tablet 1  . dicyclomine (BENTYL) 10 MG capsule Take 1 capsule (10 mg total) by mouth 4 (four) times daily -  before meals and at bedtime. (Patient not taking: Reported on 01/17/2020) 120 capsule 1  . HYDROcodone-acetaminophen (NORCO/VICODIN) 5-325 MG tablet Take 1 tablet by mouth every 8 (eight) hours as needed for moderate pain. 45 tablet 0   No current facility-administered medications for this visit.    Facility-Administered Medications Ordered in Other Visits  Medication Dose Route Frequency Provider Last Rate Last Admin  . dexamethasone (DECADRON) 10 mg in sodium chloride 0.9 % 50 mL IVPB  10 mg Intravenous Once Charlaine Dalton R, MD      . fluorouracil (ADRUCIL) 6,050 mg in sodium chloride 0.9 % 129 mL chemo infusion  2,400 mg/m2 (Treatment Plan Recorded) Intravenous 1 day or 1 dose Charlaine Dalton R, MD      . heparin lock flush 100 unit/mL  500 Units Intravenous Once Charlaine Dalton R, MD      . leucovorin 1,000 mg in dextrose 5 % 250 mL infusion  1,000 mg Intravenous Once Charlaine Dalton R, MD      . oxaliplatin (ELOXATIN) 200 mg in dextrose 5 % 500 mL chemo infusion  200 mg Intravenous Once Cammie Sickle, MD          .  PHYSICAL EXAMINATION: ECOG PERFORMANCE STATUS: 1 - Symptomatic but completely ambulatory  Vitals:   01/31/20 0829  BP: 124/84  Pulse: 81  Resp: 16  Temp: (!) 97 F (36.1 C)  SpO2: 100%   Filed Weights   01/31/20 0829  Weight: 262 lb (118.8 kg)    Physical Exam HENT:     Head: Normocephalic and atraumatic.     Mouth/Throat:     Pharynx: No oropharyngeal exudate.  Eyes:     Pupils: Pupils are equal, round, and reactive to light.  Cardiovascular:     Rate and Rhythm: Normal rate and regular rhythm.  Pulmonary:     Effort: Pulmonary effort is normal. No respiratory distress.     Breath sounds: Normal breath sounds. No wheezing.  Abdominal:     General: Bowel sounds are normal. There is no distension.     Palpations: Abdomen is soft. There is no mass.     Tenderness: There is no abdominal tenderness. There is no guarding or rebound.  Musculoskeletal:        General: No tenderness. Normal range of motion.     Cervical back: Normal range of motion and neck supple.  Skin:    General: Skin is warm.  Neurological:     Mental Status: He is alert and oriented to person, place, and time.  Psychiatric:        Mood and Affect:  Affect normal.      LABORATORY DATA:  I have reviewed the data as listed Lab Results  Component Value Date   WBC 2.6 (L) 01/31/2020   HGB 10.5 (L) 01/31/2020   HCT 33.3 (L) 01/31/2020   MCV 75.3 (L) 01/31/2020   PLT 230 01/31/2020   Recent Labs    01/04/20  2297 01/17/20 0817 01/31/20 0816  NA 139 141 140  K 4.1 4.3 3.8  CL 107 107 106  CO2 26 25 25   GLUCOSE 96 110* 107*  BUN 20 26* 21*  CREATININE 0.89 0.97 1.15  CALCIUM 8.7* 8.9 8.9  GFRNONAA >60 >60 >60  GFRAA >60 >60 >60  PROT 7.6 7.2 7.7  ALBUMIN 3.9 3.7 3.9  AST 16 21 30   ALT 13 20 30   ALKPHOS 78 74 91  BILITOT 0.5 0.5 0.6    RADIOGRAPHIC STUDIES: I have personally reviewed the radiological images as listed and agreed with the findings in the report. MR Pelvis Wo Contrast  Result Date: 01/02/2020 CLINICAL DATA:  Newly diagnosed rectal carcinoma. EXAM: MRI PELVIS WITHOUT CONTRAST TECHNIQUE: Multiplanar multisequence MR imaging of the pelvis was performed. No intravenous contrast was administered. Small amount of [place] gel was administered per rectum to optimize tumor evaluation. COMPARISON:  PET-CT on 12/27/2019 FINDINGS: TUMOR LOCATION Tumor distance from Anal Verge/Skin Surface:  4.2 cm Tumor distance to Internal Anal Sphincter: 0 cm TUMOR DESCRIPTION Circumferential Extent: 100% Tumor Length: 9.3 cm T - CATEGORY Extension through Muscularis Propria: Yes. A focal area of extension through the muscularis propria is seen along the left lateral rectal wall in the proximal portion of the tumor, which measures approximately 4 mm (image 10/8). There is also a focal tumor bulge along the right lateral wall of the distal rectum which measures approximately 7 mm, and appears to involve the right levator ani muscle (image 35/8). (T3c) Shortest Distance of any tumor/node from Mesorectal Fascia: 0 mm from large perirectal lymph nodes along the lateral and posterior aspect of the rectum Extramural Vascular Invasion/Tumor Thrombus: No  Invasion of Anterior Peritoneal Reflection: No Involvement of Adjacent Organs or Pelvic Sidewall: No Levator Ani Involvement: Probable involvement of right levator ani muscle along the inferior right lateral rectal wall) image 18/9 and 35/8). N - CATEGORY Mesorectal Lymph Nodes >=66mm: 4 or more=N2. Large well-circumscribed heterogeneous masses are seen along the left lateral and posterior wall of the rectum, largest measuring 8.7 x 6.5 cm. These are most consistent with markedly enlarged perirectal lymph nodes. Numerous other enlarged lymph nodes are seen throughout the perirectal space. Extra-mesorectal Lymphadenopathy: Yes, in left internal and external iliac chains measuring up to 7 mm = N2 Other:  Sigmoid diverticulosis, without evidence of diverticulitis. IMPRESSION: Rectal adenocarcinoma T stage: T3c; probable involvement of right levator ani muscle also noted Rectal adenocarcinoma N stage: N2; unusual, markedly enlarged left lateral and posterior perirectal lymph nodes noted, largest measuring 8.7 x 6.5 cm, displacing the rectum to the right. Distance from tumor to the internal anal sphincter is 0 cm. Electronically Signed   By: Marlaine Hind M.D.   On: 01/02/2020 08:43    ASSESSMENT & PLAN:   Rectal cancer ([place]) #Rectal adenocarcinoma- moderate differentiated; stage III [multiple pelvic lymph nodes]; question borderline 9 mm retroperitoneal neuropathy. PET-bulky rectal mass; multiple lymph nodes noted; no uptake in the retroperitoneal lymph nodes; MRI rectum- T3N2M0;   Pretreatment CEA 94.  Currently on TNT [total neoadjuvant therapy]; currently on FOLFOX chemotherapy.  #Proceed with cycle #3 of FOLFOX chemotherapy; Labs today reviewed;  acceptable for treatment today except ANC- 700. Will discontinue 5FU bolus. Add growth factor support. Discussed re: neutropenic precuations.   # Nose bleeds- G-1-recommend saline spray/Vaseline/ nose pinching/ice pack.  # PN-cold sensistivity-monitor closely; ?   Causing difficulty with coordination-monitor closely.  #Iron deficient anemia/chronic GI bleed-Hb 9.5- -plan IV iron today  #  Peri-rectal pain- tried hemp; concerned about narcotic addiction; will add hydrocodone every 8 prn.  Discussed concerns with constipation; dizziness; caution with driving.  DISPOSITION:  # chemo today; Venofer ; pump off in 2 days  # ADD FULPHILA on d-2/ on the day of pump off  #Follow-up in 2 weeks; MD; labs CBC CMP; CBC; Venofer;FOLFOX chemotherapy; pump off in 2 days; FULPHILA-Dr.B   All questions were answered. The patient knows to call the clinic with any problems, questions or concerns.    Cammie Sickle, MD 01/31/2020 10:09 AM

## 2020-01-31 NOTE — Progress Notes (Signed)
Per MD ok to treat with blood counts. Will add growth factor post chemo.

## 2020-01-31 NOTE — Assessment & Plan Note (Addendum)
#  Rectal adenocarcinoma- moderate differentiated; stage III [multiple pelvic lymph nodes]; question borderline 9 mm retroperitoneal neuropathy. PET-bulky rectal mass; multiple lymph nodes noted; no uptake in the retroperitoneal lymph nodes; MRI rectum- T3N2M0;   Pretreatment CEA 94.  Currently on TNT [total neoadjuvant therapy]; currently on FOLFOX chemotherapy.  #Proceed with cycle #3 of FOLFOX chemotherapy; Labs today reviewed;  acceptable for treatment today except ANC- 700. Will discontinue 5FU bolus. Add growth factor support. Discussed re: neutropenic precuations.   # Nose bleeds- G-1-recommend saline spray/Vaseline/ nose pinching/ice pack.  # PN-cold sensistivity-monitor closely; ?  Causing difficulty with coordination-monitor closely.  #Iron deficient anemia/chronic GI bleed-Hb 9.5- -plan IV iron today  # Peri-rectal pain- tried hemp; concerned about narcotic addiction; will add hydrocodone every 8 prn.  Discussed concerns with constipation; dizziness; caution with driving.  DISPOSITION:  # chemo today; Venofer ; pump off in 2 days  # ADD FULPHILA on d-2/ on the day of pump off  #Follow-up in 2 weeks; MD; labs CBC CMP; CBC; Venofer;FOLFOX chemotherapy; pump off in 2 days; FULPHILA-Dr.B

## 2020-02-02 ENCOUNTER — Inpatient Hospital Stay: Payer: 59

## 2020-02-02 ENCOUNTER — Other Ambulatory Visit: Payer: Self-pay

## 2020-02-02 VITALS — BP 127/73 | HR 65 | Temp 98.1°F | Resp 18

## 2020-02-02 DIAGNOSIS — Z5111 Encounter for antineoplastic chemotherapy: Secondary | ICD-10-CM | POA: Diagnosis not present

## 2020-02-02 DIAGNOSIS — C20 Malignant neoplasm of rectum: Secondary | ICD-10-CM

## 2020-02-02 MED ORDER — HEPARIN SOD (PORK) LOCK FLUSH 100 UNIT/ML IV SOLN
INTRAVENOUS | Status: AC
  Filled 2020-02-02: qty 5

## 2020-02-02 MED ORDER — HEPARIN SOD (PORK) LOCK FLUSH 100 UNIT/ML IV SOLN
500.0000 [IU] | Freq: Once | INTRAVENOUS | Status: AC | PRN
  Administered 2020-02-02: 500 [IU]
  Filled 2020-02-02: qty 5

## 2020-02-02 MED ORDER — SODIUM CHLORIDE 0.9% FLUSH
10.0000 mL | INTRAVENOUS | Status: DC | PRN
  Filled 2020-02-02: qty 10

## 2020-02-02 MED ORDER — PEGFILGRASTIM 6 MG/0.6ML ~~LOC~~ PSKT
6.0000 mg | PREFILLED_SYRINGE | Freq: Once | SUBCUTANEOUS | Status: AC
  Administered 2020-02-02: 6 mg via SUBCUTANEOUS
  Filled 2020-02-02: qty 0.6

## 2020-02-14 ENCOUNTER — Encounter: Payer: Self-pay | Admitting: Internal Medicine

## 2020-02-14 ENCOUNTER — Inpatient Hospital Stay: Payer: 59

## 2020-02-14 ENCOUNTER — Encounter: Payer: Self-pay | Admitting: *Deleted

## 2020-02-14 ENCOUNTER — Inpatient Hospital Stay: Payer: 59 | Attending: Internal Medicine

## 2020-02-14 ENCOUNTER — Other Ambulatory Visit: Payer: Self-pay

## 2020-02-14 ENCOUNTER — Inpatient Hospital Stay (HOSPITAL_BASED_OUTPATIENT_CLINIC_OR_DEPARTMENT_OTHER): Payer: 59 | Admitting: Internal Medicine

## 2020-02-14 DIAGNOSIS — D500 Iron deficiency anemia secondary to blood loss (chronic): Secondary | ICD-10-CM

## 2020-02-14 DIAGNOSIS — D509 Iron deficiency anemia, unspecified: Secondary | ICD-10-CM | POA: Diagnosis not present

## 2020-02-14 DIAGNOSIS — K922 Gastrointestinal hemorrhage, unspecified: Secondary | ICD-10-CM | POA: Insufficient documentation

## 2020-02-14 DIAGNOSIS — Z5111 Encounter for antineoplastic chemotherapy: Secondary | ICD-10-CM | POA: Diagnosis not present

## 2020-02-14 DIAGNOSIS — C20 Malignant neoplasm of rectum: Secondary | ICD-10-CM | POA: Insufficient documentation

## 2020-02-14 LAB — CBC WITH DIFFERENTIAL/PLATELET
Abs Immature Granulocytes: 0.19 10*3/uL — ABNORMAL HIGH (ref 0.00–0.07)
Basophils Absolute: 0 10*3/uL (ref 0.0–0.1)
Basophils Relative: 0 %
Eosinophils Absolute: 0 10*3/uL (ref 0.0–0.5)
Eosinophils Relative: 0 %
HCT: 35.5 % — ABNORMAL LOW (ref 39.0–52.0)
Hemoglobin: 11.3 g/dL — ABNORMAL LOW (ref 13.0–17.0)
Immature Granulocytes: 2 %
Lymphocytes Relative: 17 %
Lymphs Abs: 1.7 10*3/uL (ref 0.7–4.0)
MCH: 24.5 pg — ABNORMAL LOW (ref 26.0–34.0)
MCHC: 31.8 g/dL (ref 30.0–36.0)
MCV: 76.8 fL — ABNORMAL LOW (ref 80.0–100.0)
Monocytes Absolute: 0.8 10*3/uL (ref 0.1–1.0)
Monocytes Relative: 8 %
Neutro Abs: 7.4 10*3/uL (ref 1.7–7.7)
Neutrophils Relative %: 73 %
Platelets: 132 10*3/uL — ABNORMAL LOW (ref 150–400)
RBC: 4.62 MIL/uL (ref 4.22–5.81)
RDW: 21.5 % — ABNORMAL HIGH (ref 11.5–15.5)
WBC: 10.2 10*3/uL (ref 4.0–10.5)
nRBC: 0 % (ref 0.0–0.2)

## 2020-02-14 LAB — COMPREHENSIVE METABOLIC PANEL
ALT: 27 U/L (ref 0–44)
AST: 26 U/L (ref 15–41)
Albumin: 3.8 g/dL (ref 3.5–5.0)
Alkaline Phosphatase: 130 U/L — ABNORMAL HIGH (ref 38–126)
Anion gap: 11 (ref 5–15)
BUN: 18 mg/dL (ref 6–20)
CO2: 25 mmol/L (ref 22–32)
Calcium: 9 mg/dL (ref 8.9–10.3)
Chloride: 105 mmol/L (ref 98–111)
Creatinine, Ser: 1.25 mg/dL — ABNORMAL HIGH (ref 0.61–1.24)
GFR calc Af Amer: >60 mL/min (ref >60)
GFR calc non Af Amer: >60 mL/min (ref >60)
Glucose, Bld: 106 mg/dL — ABNORMAL HIGH (ref 70–99)
Potassium: 3.7 mmol/L (ref 3.5–5.1)
Sodium: 141 mmol/L (ref 135–145)
Total Bilirubin: 0.5 mg/dL (ref 0.3–1.2)
Total Protein: 7.8 g/dL (ref 6.5–8.1)

## 2020-02-14 MED ORDER — SODIUM CHLORIDE 0.9% FLUSH
10.0000 mL | Freq: Once | INTRAVENOUS | Status: AC
  Administered 2020-02-14: 10 mL via INTRAVENOUS
  Filled 2020-02-14: qty 10

## 2020-02-14 MED ORDER — SODIUM CHLORIDE 0.9 % IV SOLN
10.0000 mg | Freq: Once | INTRAVENOUS | Status: AC
  Administered 2020-02-14: 10 mg via INTRAVENOUS
  Filled 2020-02-14: qty 10

## 2020-02-14 MED ORDER — PALONOSETRON HCL INJECTION 0.25 MG/5ML
0.2500 mg | Freq: Once | INTRAVENOUS | Status: AC
  Administered 2020-02-14: 0.25 mg via INTRAVENOUS
  Filled 2020-02-14: qty 5

## 2020-02-14 MED ORDER — SODIUM CHLORIDE 0.9 % IV SOLN
2400.0000 mg/m2 | INTRAVENOUS | Status: DC
  Administered 2020-02-14: 6050 mg via INTRAVENOUS
  Filled 2020-02-14: qty 121

## 2020-02-14 MED ORDER — LEUCOVORIN CALCIUM INJECTION 350 MG
1000.0000 mg | Freq: Once | INTRAVENOUS | Status: AC
  Administered 2020-02-14: 1000 mg via INTRAVENOUS
  Filled 2020-02-14: qty 50

## 2020-02-14 MED ORDER — SODIUM CHLORIDE 0.9 % IV SOLN
200.0000 mg | Freq: Once | INTRAVENOUS | Status: DC
  Filled 2020-02-14: qty 10

## 2020-02-14 MED ORDER — NEULASTA ONPRO 6 MG/0.6ML ~~LOC~~ PSKT: 6.0000 mg | PREFILLED_SYRINGE | Freq: Once | SUBCUTANEOUS | 2 refills | Status: AC

## 2020-02-14 MED ORDER — OXALIPLATIN CHEMO INJECTION 100 MG/20ML
200.0000 mg | Freq: Once | INTRAVENOUS | Status: AC
  Administered 2020-02-14: 200 mg via INTRAVENOUS
  Filled 2020-02-14: qty 40

## 2020-02-14 MED ORDER — HEPARIN SOD (PORK) LOCK FLUSH 100 UNIT/ML IV SOLN
500.0000 [IU] | Freq: Once | INTRAVENOUS | Status: DC
  Filled 2020-02-14: qty 5

## 2020-02-14 MED ORDER — SODIUM CHLORIDE 0.9% FLUSH
10.0000 mL | INTRAVENOUS | Status: DC | PRN
  Administered 2020-02-14: 10 mL
  Filled 2020-02-14: qty 10

## 2020-02-14 MED ORDER — DEXTROSE 5 % IV SOLN
Freq: Once | INTRAVENOUS | Status: AC
  Filled 2020-02-14: qty 250

## 2020-02-14 MED ORDER — SODIUM CHLORIDE 0.9 % IV SOLN
Freq: Once | INTRAVENOUS | Status: AC
  Filled 2020-02-14: qty 250

## 2020-02-14 MED ORDER — IRON SUCROSE 20 MG/ML IV SOLN
200.0000 mg | Freq: Once | INTRAVENOUS | Status: AC
  Administered 2020-02-14: 200 mg via INTRAVENOUS
  Filled 2020-02-14: qty 10

## 2020-02-14 NOTE — Progress Notes (Signed)
[place] CONSULT NOTE  Patient Care Team: Volney American, PA-C as PCP - General (Family Medicine) Clent Jacks, RN as Oncology Nurse Navigator [name], MD as Consulting Physician (Gastroenterology) Cammie Sickle, MD as Consulting Physician (Internal Medicine)  CHIEF COMPLAINTS/PURPOSE OF CONSULTATION: Rectal cancer  #  Oncology History Overview Note  # MAY 2021-rectal adenocarcinoma ; moderately differentiated; [Dr.Anna]; colonoscopy-partially obstructing circumferential rectal mass-starting at anal verge to proximal. CEA- 20. CT C/A/P- Large mass involving the rectum is identified compatible with primary rectal carcinoma. This has a large, 6.7 cm exophytic component in the left pelvis. Multiple perirectal lymph nodes are identified compatible with metastatic adenopathy; Borderline enlarged aortocaval node measures 0.9 cm; PET-bulky rectal mass; multiple lymph nodes noted; no uptake in the retroperitoneal lymph nodes; MRI rectum- T3N2M0;   Pretreatment CEA 94; TNT  # June 1st 2021- FOLFOX  # SURVIVORSHIP:   # GENETICS:   DIAGNOSIS: RECTAL CA  STAGE:   III     ;  GOALS: cure  CURRENT/MOST RECENT THERAPY : FOLFOX [C]   Rectal cancer ([place])  12/20/2019 Initial Diagnosis   Rectal cancer ([place])   01/04/2020 -  Chemotherapy   The patient had dexamethasone (DECADRON) 4 MG tablet, 8 mg, Oral, Daily, 1 of 1 cycle, Start date: --, End date: -- palonosetron (ALOXI) injection 0.25 mg, 0.25 mg, Intravenous,  Once, 4 of 8 cycles Administration: 0.25 mg (01/04/2020), 0.25 mg (01/17/2020), 0.25 mg (01/31/2020), 0.25 mg (02/14/2020) pegfilgrastim (NEULASTA ONPRO KIT) injection 6 mg, 6 mg, Subcutaneous, Once, 2 of 6 cycles Administration: 6 mg (02/02/2020) leucovorin 1,000 mg in dextrose 5 % 250 mL infusion, 1,008 mg, Intravenous,  Once, 4 of 8 cycles Administration: 1,000 mg (01/04/2020), 1,000 mg (01/17/2020), 1,000 mg (01/31/2020), 1,000 mg (02/14/2020) oxaliplatin  (ELOXATIN) 200 mg in dextrose 5 % 500 mL chemo infusion, 215 mg, Intravenous,  Once, 4 of 8 cycles Administration: 200 mg (01/04/2020), 200 mg (01/17/2020), 200 mg (01/31/2020), 200 mg (02/14/2020) fluorouracil (ADRUCIL) chemo injection 1,000 mg, 400 mg/m2 = 1,000 mg, Intravenous,  Once, 2 of 2 cycles Administration: 1,000 mg (01/04/2020), 1,000 mg (01/17/2020) fluorouracil (ADRUCIL) 6,050 mg in sodium chloride 0.9 % 129 mL chemo infusion, 2,400 mg/m2 = 6,050 mg, Intravenous, 1 Day/Dose, 4 of 8 cycles Administration: 6,050 mg (01/04/2020), 6,050 mg (01/17/2020), 6,050 mg (01/31/2020), 6,050 mg (02/14/2020)  for chemotherapy treatment.       HISTORY OF PRESENTING ILLNESS:  Stephen Vasquez 59 y.o.  male  With stage III rectal cancer currently on TNT-FOLFOX chemotherapy is here for follow-up.  Patient underwent cycle #3 of chemotherapy 2 weeks ago; growth factor support.  No nausea no vomiting.  No headaches.  No weight loss.  Denies any difficulty with coordination.   Review of Systems  Constitutional: Negative for chills, diaphoresis, fever, malaise/fatigue and weight loss.  HENT: Negative for nosebleeds and sore throat.   Eyes: Negative for double vision.  Respiratory: Negative for cough, hemoptysis, sputum production, shortness of breath and wheezing.   Cardiovascular: Negative for chest pain, palpitations, orthopnea and leg swelling.  Gastrointestinal: Positive for blood in stool. Negative for abdominal pain, constipation, diarrhea, heartburn, melena, nausea and vomiting.  Genitourinary: Negative for dysuria, frequency and urgency.  Musculoskeletal: Positive for joint pain. Negative for back pain.  Skin: Negative.  Negative for itching and rash.  Neurological: Negative for dizziness, tingling, focal weakness, weakness and headaches.  Endo/Heme/Allergies: Does not bruise/bleed easily.  Psychiatric/Behavioral: Negative for depression. The patient is not nervous/anxious and does not  have insomnia.       MEDICAL HISTORY:  Past Medical History:  Diagnosis Date  . Allergic rhinitis   . Heart murmur   . Rectal cancer [facility])     SURGICAL HISTORY: Past Surgical History:  Procedure Laterality Date  . COLONOSCOPY WITH PROPOFOL N/A 12/15/2019   Procedure: COLONOSCOPY WITH PROPOFOL;  Surgeon: [name], MD;  Location: [facility] ENDOSCOPY;  Service: Gastroenterology;  Laterality: N/A;  . PORTACATH PLACEMENT Left 12/29/2019   Procedure: INSERTION PORT-A-CATH;  Surgeon: Robert Bellow, MD;  Location: [facility] ORS;  Service: General;  Laterality: Left;    SOCIAL HISTORY: Social History   Socioeconomic History  . Marital status: Married    Spouse name: Not on file  . Number of children: Not on file  . Years of education: Not on file  . Highest education level: Not on file  Occupational History  . Not on file  Tobacco Use  . Smoking status: Former Research scientist (life sciences)  . Smokeless tobacco: Never Used  Vaping Use  . Vaping Use: Never used  Substance and Sexual Activity  . Alcohol use: Not Currently  . Drug use: Never  . Sexual activity: Yes  Other Topics Concern  . Not on file  Social History Narrative   Lives in [place]; Freight forwarder at [employer]; 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 2 children- in boy and girls in late 74s.    Social Determinants of Health   Financial Resource Strain:   . Difficulty of Paying Living Expenses:   Food Insecurity:   . Worried About Charity fundraiser in the Last Year:   . Arboriculturist in the Last Year:   Transportation Needs:   . Film/video editor (Medical):   Marland Kitchen Lack of Transportation (Non-Medical):   Physical Activity:   . Days of Exercise per Week:   . Minutes of Exercise per Session:   Stress:   . Feeling of Stress :   Social Connections:   . Frequency of Communication with Friends and Family:   . Frequency of Social Gatherings with Friends and Family:   . Attends Religious Services:   . Active Member of Clubs or Organizations:   . Attends English as a second language teacher Meetings:   Marland Kitchen Marital Status:   Intimate Partner Violence:   . Fear of Current or Ex-Partner:   . Emotionally Abused:   Marland Kitchen Physically Abused:   . Sexually Abused:     FAMILY HISTORY: Family History  Problem Relation Age of Onset  . Hypertension Mother   . Hypertension Father   . Stomach cancer Father        in 56s- survived.   Marland Kitchen Heart disease Brother     ALLERGIES:  has No Known Allergies.  MEDICATIONS:  Current Outpatient Medications  Medication Sig Dispense Refill  . azelastine (ASTELIN) 0.1 % nasal spray Place 2 sprays into both nostrils 2 (two) times daily. Use in each nostril as directed 30 mL 12  . dicyclomine (BENTYL) 10 MG capsule Take 1 capsule (10 mg total) by mouth 4 (four) times daily -  before meals and at bedtime. 120 capsule 1  . HYDROcodone-acetaminophen (NORCO/VICODIN) 5-325 MG tablet Take 1 tablet by mouth every 8 (eight) hours as needed for moderate pain. 45 tablet 0  . lidocaine-prilocaine (EMLA) cream Apply 1 application topically as needed. (Patient taking differently: Apply 1 application topically as needed (port access). ) 30 g 0  . montelukast (SINGULAIR) 10 MG tablet Take 1 tablet (10  mg total) by mouth at bedtime. 30 tablet 3  . naproxen sodium (ALEVE) 220 MG tablet Take 220 mg by mouth 2 (two) times daily as needed (pain).    . ondansetron (ZOFRAN) 8 MG tablet One pill every 8 hours as needed for nausea/vomitting. 40 tablet 1  . oxymetazoline (AFRIN) 0.05 % nasal spray Place 1 spray into both nostrils 2 (two) times daily as needed for congestion.    . prochlorperazine (COMPAZINE) 10 MG tablet Take 1 tablet (10 mg total) by mouth every 6 (six) hours as needed for nausea or vomiting. 40 tablet 1   No current facility-administered medications for this visit.      Marland Kitchen  PHYSICAL EXAMINATION: ECOG PERFORMANCE STATUS: 1 - Symptomatic but completely ambulatory  Vitals:   02/14/20 0835  BP: 119/86  Pulse: 70  Resp: 20  Temp: 98.9 F  (37.2 C)   Filed Weights   02/14/20 0835  Weight: 255 lb 3.2 oz (115.8 kg)    Physical Exam HENT:     Head: Normocephalic and atraumatic.     Mouth/Throat:     Pharynx: No oropharyngeal exudate.  Eyes:     Pupils: Pupils are equal, round, and reactive to light.  Cardiovascular:     Rate and Rhythm: Normal rate and regular rhythm.  Pulmonary:     Effort: Pulmonary effort is normal. No respiratory distress.     Breath sounds: Normal breath sounds. No wheezing.  Abdominal:     General: Bowel sounds are normal. There is no distension.     Palpations: Abdomen is soft. There is no mass.     Tenderness: There is no abdominal tenderness. There is no guarding or rebound.  Musculoskeletal:        General: No tenderness. Normal range of motion.     Cervical back: Normal range of motion and neck supple.  Skin:    General: Skin is warm.  Neurological:     Mental Status: He is alert and oriented to person, place, and time.  Psychiatric:        Mood and Affect: Affect normal.      LABORATORY DATA:  I have reviewed the data as listed Lab Results  Component Value Date   WBC 10.2 02/14/2020   HGB 11.3 (L) 02/14/2020   HCT 35.5 (L) 02/14/2020   MCV 76.8 (L) 02/14/2020   PLT 132 (L) 02/14/2020   Recent Labs    01/17/20 0817 01/31/20 0816 02/14/20 0807  NA 141 140 141  K 4.3 3.8 3.7  CL 107 106 105  CO2 25 25 25   GLUCOSE 110* 107* 106*  BUN 26* 21* 18  CREATININE 0.97 1.15 1.25*  CALCIUM 8.9 8.9 9.0  GFRNONAA >60 >60 >60  GFRAA >60 >60 >60  PROT 7.2 7.7 7.8  ALBUMIN 3.7 3.9 3.8  AST 21 30 26   ALT 20 30 27   ALKPHOS 74 91 130*  BILITOT 0.5 0.6 0.5    RADIOGRAPHIC STUDIES: I have personally reviewed the radiological images as listed and agreed with the findings in the report. No results found.  ASSESSMENT & PLAN:   Rectal cancer ([place]) #Rectal adenocarcinoma- moderate differentiated; stage III [multiple pelvic lymph nodes]; question borderline 9 mm retroperitoneal  neuropathy. PET-bulky rectal mass; multiple lymph nodes noted; no uptake in the retroperitoneal lymph nodes; MRI rectum- T3N2M0;   Pretreatment CEA 94.  Currently on TNT [total neoadjuvant therapy]; currently on FOLFOX chemotherapy.  #Proceed with cycle #4 of FOLFOX chemotherapy; Labs today reviewed;  acceptable for treatment.  Growth factor support  # Nose bleeds- G-1-recommend saline spray/Vaseline/ nose pinching/ice pack.  # PN-cold sensistivity-G-1- STABLE.   #Iron deficient anemia/chronic GI bleed-Hb 9.5-STABLE;  -plan IV iron today  # Peri-rectal pain- Improved; vicodin prn.   # Weight loss- ~ 20 pounds [eating healthier]; however recommend follow-up with Almyra Free.  DISPOSITION:  # Referral to [facility]  # chemo today; Venofer ; pump off in 2 days;  onpro on d-3/ on the day of pump off  #Follow-up in 2 weeks; MD; labs CBC CMP; CEA; Venofer; FOLFOX   D-3- pump off & onpro--Dr.B   All questions were answered. The patient knows to call the clinic with any problems, questions or concerns.    Cammie Sickle, MD 02/27/2020 6:39 PM

## 2020-02-14 NOTE — Assessment & Plan Note (Addendum)
#  Rectal adenocarcinoma- moderate differentiated; stage III [multiple pelvic lymph nodes]; question borderline 9 mm retroperitoneal neuropathy. PET-bulky rectal mass; multiple lymph nodes noted; no uptake in the retroperitoneal lymph nodes; MRI rectum- T3N2M0;   Pretreatment CEA 94.  Currently on TNT [total neoadjuvant therapy]; currently on FOLFOX chemotherapy.  #Proceed with cycle #4 of FOLFOX chemotherapy; Labs today reviewed;  acceptable for treatment.  Growth factor support  # Nose bleeds- G-1-recommend saline spray/Vaseline/ nose pinching/ice pack.  # PN-cold sensistivity-G-1- STABLE.   #Iron deficient anemia/chronic GI bleed-Hb 9.5-STABLE;  -plan IV iron today  # Peri-rectal pain- Improved; vicodin prn.   # Weight loss- ~ 20 pounds [eating healthier]; however recommend follow-up with Almyra Free.  DISPOSITION:  # Referral to [facility]  # chemo today; Venofer ; pump off in 2 days;  onpro on d-3/ on the day of pump off  #Follow-up in 2 weeks; MD; labs CBC CMP; CEA; Venofer; FOLFOX   D-3- pump off & onpro--Dr.B

## 2020-02-16 ENCOUNTER — Telehealth: Payer: Self-pay | Admitting: *Deleted

## 2020-02-16 ENCOUNTER — Inpatient Hospital Stay: Payer: 59

## 2020-02-16 ENCOUNTER — Other Ambulatory Visit: Payer: Self-pay

## 2020-02-16 VITALS — BP 136/82 | HR 69 | Temp 97.3°F | Resp 18

## 2020-02-16 DIAGNOSIS — C20 Malignant neoplasm of rectum: Secondary | ICD-10-CM

## 2020-02-16 DIAGNOSIS — Z5111 Encounter for antineoplastic chemotherapy: Secondary | ICD-10-CM | POA: Diagnosis not present

## 2020-02-16 MED ORDER — HEPARIN SOD (PORK) LOCK FLUSH 100 UNIT/ML IV SOLN
500.0000 [IU] | Freq: Once | INTRAVENOUS | Status: AC | PRN
  Administered 2020-02-16: 500 [IU]
  Filled 2020-02-16: qty 5

## 2020-02-16 MED ORDER — PEGFILGRASTIM 6 MG/0.6ML ~~LOC~~ PSKT
6.0000 mg | PREFILLED_SYRINGE | Freq: Once | SUBCUTANEOUS | Status: DC
  Filled 2020-02-16: qty 0.6

## 2020-02-16 MED ORDER — SODIUM CHLORIDE 0.9% FLUSH
10.0000 mL | INTRAVENOUS | Status: DC | PRN
  Administered 2020-02-16: 10 mL
  Filled 2020-02-16: qty 10

## 2020-02-16 MED ORDER — HEPARIN SOD (PORK) LOCK FLUSH 100 UNIT/ML IV SOLN
INTRAVENOUS | Status: AC
  Filled 2020-02-16: qty 5

## 2020-02-16 NOTE — Telephone Encounter (Signed)
Spoke with [facility] pharmacist - new script verbally given to pharmacist as directed by Dr. Rogue Bussing.  [facility] and myself both attempted to reach the patient to arrange for delivery. [facility] will not send medication - until they have talked with the patient.  Attempted to reach pt's wife. Mail box is full and I am unable to leave a vm  Dr. Rogue Bussing updated and made aware.

## 2020-02-16 NOTE — Progress Notes (Signed)
Per Nira Conn, RN, patient will not be getting Neulasta OnPro during appt. Today. Reports he is to receive it in the mail and self administer at home. Nira Conn made aware patient has not received it yet. Nira Conn states she will reach out to pharmacy now to expedite. Informed patient Nira Conn will reach out to him regarding Neulasta. Patient verbalizes understanding and denies any further questions or concerns.

## 2020-02-16 NOTE — Telephone Encounter (Signed)
Contacted [facility]- I spoke with Jori Moll (re: [phone]).  Explained that patient still has not received his onpro via mail order. I explained that I called the pharmacist on Monday as well as faxed the script. The pharmacy was to expedite the prescription to patient's residence. Per [facility], the pharmacy was to deliver the medication to [facility]. I explained again that this med was to be delivered to the pt's residence. I was then transferred to clinical [facility] pharmacist to verify that the medication was to go to the patient's residence. The pharmacist stated that she would correct this in the system. I was then transferred back to [place], who proceeded with processing the order. He stated that this prescription needed a new PA as the old PA was attached to the chemotherapy Prior Auth.   Ron was not able to complete a PA on this call. I was transferred to [facility] at [phone].  Per Marjory Lies, the prescription does not - need a new PA "no PA required."  I was transferred to Gamal[place], who ran the claim and stated that this did not need a PA. I asked for this document in writing. He was unable to fax this information to me. He attempted to update Dr. Aletha Halim address and was unable to do this in the system. RN Held for 1 hour's time on the phone for Marjory Lies to return to line.  14:15 - called back to [employer]. Dr. Jacinto Reap and myself - Spoke with case manager at [facility]. Explained the situation. The onpro is not approved for pt to self admin. Patient must be switched to neulasta injection 6 mg prefilled syringe. Pt's copay is $47. Dr. Jacinto Reap gave v/o to change script to neulasta prefilled syringe 6 mg. Subcutaneous injections. #6 syringes

## 2020-02-28 ENCOUNTER — Inpatient Hospital Stay: Payer: 59

## 2020-02-28 ENCOUNTER — Encounter: Payer: Self-pay | Admitting: Internal Medicine

## 2020-02-28 ENCOUNTER — Inpatient Hospital Stay (HOSPITAL_BASED_OUTPATIENT_CLINIC_OR_DEPARTMENT_OTHER): Payer: 59 | Admitting: Internal Medicine

## 2020-02-28 ENCOUNTER — Telehealth: Payer: Self-pay | Admitting: *Deleted

## 2020-02-28 ENCOUNTER — Other Ambulatory Visit: Payer: Self-pay

## 2020-02-28 DIAGNOSIS — C20 Malignant neoplasm of rectum: Secondary | ICD-10-CM | POA: Diagnosis not present

## 2020-02-28 DIAGNOSIS — D500 Iron deficiency anemia secondary to blood loss (chronic): Secondary | ICD-10-CM

## 2020-02-28 DIAGNOSIS — Z5111 Encounter for antineoplastic chemotherapy: Secondary | ICD-10-CM | POA: Diagnosis not present

## 2020-02-28 LAB — CBC WITH DIFFERENTIAL/PLATELET
Abs Immature Granulocytes: 0.11 10*3/uL — ABNORMAL HIGH (ref 0.00–0.07)
Basophils Absolute: 0.1 10*3/uL (ref 0.0–0.1)
Basophils Relative: 1 %
Eosinophils Absolute: 0.1 10*3/uL (ref 0.0–0.5)
Eosinophils Relative: 1 %
HCT: 36.9 % — ABNORMAL LOW (ref 39.0–52.0)
Hemoglobin: 11.6 g/dL — ABNORMAL LOW (ref 13.0–17.0)
Immature Granulocytes: 1 %
Lymphocytes Relative: 20 %
Lymphs Abs: 2.3 10*3/uL (ref 0.7–4.0)
MCH: 25.5 pg — ABNORMAL LOW (ref 26.0–34.0)
MCHC: 31.4 g/dL (ref 30.0–36.0)
MCV: 81.1 fL (ref 80.0–100.0)
Monocytes Absolute: 0.9 10*3/uL (ref 0.1–1.0)
Monocytes Relative: 8 %
Neutro Abs: 7.9 10*3/uL — ABNORMAL HIGH (ref 1.7–7.7)
Neutrophils Relative %: 69 %
Platelets: 151 10*3/uL (ref 150–400)
RBC: 4.55 MIL/uL (ref 4.22–5.81)
RDW: 23.1 % — ABNORMAL HIGH (ref 11.5–15.5)
WBC: 11.3 10*3/uL — ABNORMAL HIGH (ref 4.0–10.5)
nRBC: 0 % (ref 0.0–0.2)

## 2020-02-28 LAB — COMPREHENSIVE METABOLIC PANEL
ALT: 23 U/L (ref 0–44)
AST: 23 U/L (ref 15–41)
Albumin: 3.8 g/dL (ref 3.5–5.0)
Alkaline Phosphatase: 149 U/L — ABNORMAL HIGH (ref 38–126)
Anion gap: 8 (ref 5–15)
BUN: 19 mg/dL (ref 6–20)
CO2: 26 mmol/L (ref 22–32)
Calcium: 9.2 mg/dL (ref 8.9–10.3)
Chloride: 106 mmol/L (ref 98–111)
Creatinine, Ser: 1.22 mg/dL (ref 0.61–1.24)
GFR calc Af Amer: >60 mL/min (ref >60)
GFR calc non Af Amer: >60 mL/min (ref >60)
Glucose, Bld: 105 mg/dL — ABNORMAL HIGH (ref 70–99)
Potassium: 3.9 mmol/L (ref 3.5–5.1)
Sodium: 140 mmol/L (ref 135–145)
Total Bilirubin: 0.6 mg/dL (ref 0.3–1.2)
Total Protein: 7.8 g/dL (ref 6.5–8.1)

## 2020-02-28 MED ORDER — SODIUM CHLORIDE 0.9% FLUSH
10.0000 mL | Freq: Once | INTRAVENOUS | Status: AC
  Administered 2020-02-28: 10 mL via INTRAVENOUS
  Filled 2020-02-28: qty 10

## 2020-02-28 MED ORDER — LEUCOVORIN CALCIUM INJECTION 350 MG
397.0000 mg/m2 | Freq: Once | INTRAVENOUS | Status: AC
  Administered 2020-02-28: 1000 mg via INTRAVENOUS
  Filled 2020-02-28: qty 50

## 2020-02-28 MED ORDER — SODIUM CHLORIDE 0.9 % IV SOLN
2400.0000 mg/m2 | INTRAVENOUS | Status: DC
  Administered 2020-02-28: 6050 mg via INTRAVENOUS
  Filled 2020-02-28: qty 100

## 2020-02-28 MED ORDER — DEXTROSE 5 % IV SOLN
Freq: Once | INTRAVENOUS | Status: AC
  Filled 2020-02-28: qty 250

## 2020-02-28 MED ORDER — SODIUM CHLORIDE 0.9 % IV SOLN
10.0000 mg | Freq: Once | INTRAVENOUS | Status: AC
  Administered 2020-02-28: 10 mg via INTRAVENOUS
  Filled 2020-02-28: qty 10

## 2020-02-28 MED ORDER — SODIUM CHLORIDE 0.9 % IV SOLN
Freq: Once | INTRAVENOUS | Status: AC
  Filled 2020-02-28: qty 250

## 2020-02-28 MED ORDER — PALONOSETRON HCL INJECTION 0.25 MG/5ML
0.2500 mg | Freq: Once | INTRAVENOUS | Status: AC
  Administered 2020-02-28: 0.25 mg via INTRAVENOUS
  Filled 2020-02-28: qty 5

## 2020-02-28 MED ORDER — OXALIPLATIN CHEMO INJECTION 100 MG/20ML
79.0000 mg/m2 | Freq: Once | INTRAVENOUS | Status: AC
  Administered 2020-02-28: 200 mg via INTRAVENOUS
  Filled 2020-02-28: qty 40

## 2020-02-28 MED ORDER — SODIUM CHLORIDE 0.9 % IV SOLN: 200.0000 mg | Freq: Once | INTRAVENOUS | Status: DC

## 2020-02-28 MED ORDER — IRON SUCROSE 20 MG/ML IV SOLN
200.0000 mg | Freq: Once | INTRAVENOUS | Status: AC
  Administered 2020-02-28: 200 mg via INTRAVENOUS
  Filled 2020-02-28: qty 10

## 2020-02-28 MED ORDER — HEPARIN SOD (PORK) LOCK FLUSH 100 UNIT/ML IV SOLN
500.0000 [IU] | Freq: Once | INTRAVENOUS | Status: DC
  Filled 2020-02-28: qty 5

## 2020-02-28 NOTE — Assessment & Plan Note (Addendum)
#  Rectal adenocarcinoma- moderate differentiated; stage III [multiple pelvic lymph nodes]; question borderline 9 mm retroperitoneal neuropathy. PET-bulky rectal mass; multiple lymph nodes noted; no uptake in the retroperitoneal lymph nodes; MRI rectum- T3N2M0;   Pretreatment CEA 94. ON TNT [total neoadjuvant therapy]; currently on FOLFOX chemotherapy.  #Proceed with cycle #5 of FOLFOX chemotherapy; Labs today reviewed;  acceptable for treatment.  Home Growth factor support.  We will make a referral to radiation.  Understands patient will get chemoradiation-which will be followed by surgery after approximately 2 months or so.  # Nose bleeds- G-1-STABLE.   # PN-cold sensistivity-G-1- STABLE  #Iron deficient anemia/chronic GI bleed-Hb 9.5-STABLE;  -plan IV iron today  # Peri-rectal pain- improved;  vicodin prn.   # Weight loss- improving. Monitor closely.   #Memory deficits-question "chemo brain"-no concerns for any brain metastases.  Monitor closely.  DISPOSITION:  # Referral to Dr.Crystal re: rectal cancer  # chemo today; Venofer ; pump off in 2 days;  #Follow-up in 2 weeks; MD; labs CBC CMP; CEA; Venofer; FOLFOX   D-3- pump off-Dr.B

## 2020-02-28 NOTE — Telephone Encounter (Signed)
Patient only received 2 syringes for the Neulasta at the last deliver. Patient will use 2nd syringe this week s/p treatment.  Contacted [facility] at [phone]

## 2020-02-28 NOTE — Telephone Encounter (Signed)
Spoke with [facility]. Patient has RFs on file; however, the patient MUST call to arrange for shipment and discuss copays for the medication. I informed the patient and he asked that I send this info to [employer]

## 2020-02-28 NOTE — Progress Notes (Signed)
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 CEA- 26. CT C/A/P- Large mass involving the rectum is identified compatible with primary rectal carcinoma. This has a large, 6.7 cm exophytic component in the left pelvis. Multiple perirectal lymph nodes are identified compatible with metastatic adenopathy; Borderline enlarged aortocaval node measures 0.9 cm; PET-bulky rectal mass; multiple lymph nodes noted; no uptake in the retroperitoneal lymph nodes; MRI rectum- T3N2M0;   Pretreatment CEA 94; TNT  # June 1st 2021- FOLFOX  # SURVIVORSHIP:   # GENETICS:   DIAGNOSIS: RECTAL CA  STAGE:   III     ;  GOALS: cure  CURRENT/MOST RECENT THERAPY : FOLFOX [C]   Rectal cancer ([place])  12/20/2019 Initial Diagnosis   Rectal cancer ([place])   01/04/2020 -  Chemotherapy   The patient had dexamethasone (DECADRON) 4 MG tablet, 8 mg, Oral, Daily, 1 of 1 cycle, Start date: --, End date: -- palonosetron (ALOXI) injection 0.25 mg, 0.25 mg, Intravenous,  Once, 5 of 8 cycles Administration: 0.25 mg (01/04/2020), 0.25 mg (01/17/2020), 0.25 mg (01/31/2020), 0.25 mg (02/14/2020) pegfilgrastim (NEULASTA ONPRO KIT) injection 6 mg, 6 mg, Subcutaneous, Once, 2 of 2 cycles Administration: 6 mg (02/02/2020) leucovorin 1,000 mg in dextrose 5 % 250 mL infusion, 1,008 mg, Intravenous,  Once, 5 of 8 cycles Administration: 1,000 mg (01/04/2020), 1,000 mg (01/17/2020), 1,000 mg (01/31/2020), 1,000 mg (02/14/2020) oxaliplatin  (ELOXATIN) 200 mg in dextrose 5 % 500 mL chemo infusion, 215 mg, Intravenous,  Once, 5 of 8 cycles Administration: 200 mg (01/04/2020), 200 mg (01/17/2020), 200 mg (01/31/2020), 200 mg (02/14/2020) fluorouracil (ADRUCIL) chemo injection 1,000 mg, 400 mg/m2 = 1,000 mg, Intravenous,  Once, 2 of 2 cycles Administration: 1,000 mg (01/04/2020), 1,000 mg (01/17/2020) fluorouracil (ADRUCIL) 6,050 mg in sodium chloride 0.9 % 129 mL chemo infusion, 2,400 mg/m2 = 6,050 mg, Intravenous, 1 Day/Dose, 5 of 8 cycles Administration: 6,050 mg (01/04/2020), 6,050 mg (01/17/2020), 6,050 mg (01/31/2020), 6,050 mg (02/14/2020)  for chemotherapy treatment.       HISTORY OF PRESENTING ILLNESS:  Stephen Vasquez 59 y.o.  male  With stage III rectal cancer currently on TNT-FOLFOX chemotherapy is here for follow-up.  Patient underwent cycle #4 of chemotherapy 2 weeks ago.  He received growth factor support at home.  No fever no chills no nausea no vomiting.  No headaches.  No weight loss.  Gaining weight.  Continues to have cold sensitivity in the extremities.  Noted to have improvement of his pretty rectal pain/back pain.  Taking Tylenol as needed for headaches.    Complains of intermittent pauses in memory thinking.  Denies any headaches.  Review of Systems  Constitutional: Negative for chills, diaphoresis, fever, malaise/fatigue and weight loss.  HENT: Negative for nosebleeds and sore throat.   Eyes: Negative for double vision.  Respiratory: Negative for cough, hemoptysis, sputum production, shortness of breath and wheezing.   Cardiovascular: Negative for chest pain, palpitations, orthopnea and leg swelling.  Gastrointestinal: Positive for blood in stool. Negative for abdominal pain, constipation, diarrhea, heartburn, melena, nausea and vomiting.  Genitourinary: Negative for dysuria, frequency and urgency.  Musculoskeletal: Positive  for joint pain. Negative for back pain.  Skin: Negative.  Negative for itching and rash.   Neurological: Negative for dizziness, tingling, focal weakness, weakness and headaches.  Endo/Heme/Allergies: Does not bruise/bleed easily.  Psychiatric/Behavioral: Negative for depression. The patient is not nervous/anxious and does not have insomnia.      MEDICAL HISTORY:  Past Medical History:  Diagnosis Date  . Allergic rhinitis   . Heart murmur   . Rectal cancer [facility])     SURGICAL HISTORY: Past Surgical History:  Procedure Laterality Date  . COLONOSCOPY WITH PROPOFOL N/A 12/15/2019   Procedure: COLONOSCOPY WITH PROPOFOL;  Surgeon: [name], MD;  [facility] ENDOSCOPY;  Service: Gastroenterology;  Laterality: N/A;  . PORTACATH PLACEMENT Left 12/29/2019   Procedure: INSERTION PORT-A-CATH;  Surgeon: Robert Bellow, MD;  Location: [facility] ORS;  Service: General;  Laterality: Left;    SOCIAL HISTORY: Social History   Socioeconomic History  . Marital status: Married    Spouse name: Not on file  . Number of children: Not on file  . Years of education: Not on file  . Highest education level: Not on file  Occupational History  . Not on file  Tobacco Use  . Smoking status: Former Research scientist (life sciences)  . Smokeless tobacco: Never Used  Vaping Use  . Vaping Use: Never used  Substance and Sexual Activity  . Alcohol use: Not Currently  . Drug use: Never  . Sexual activity: Yes  Other Topics Concern  . Not on file  Social History Narrative   Lives in [place]; Freight forwarder at [employer]; 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 2 children- in boy and girls in late 71s.    Social Determinants of Health   Financial Resource Strain:   . Difficulty of Paying Living Expenses:   Food Insecurity:   . Worried About Charity fundraiser in the Last Year:   . Arboriculturist in the Last Year:   Transportation Needs:   . Film/video editor (Medical):   Marland Kitchen Lack of Transportation (Non-Medical):   Physical Activity:   . Days of Exercise per Week:   . Minutes of Exercise per Session:   Stress:    . Feeling of Stress :   Social Connections:   . Frequency of Communication with Friends and Family:   . Frequency of Social Gatherings with Friends and Family:   . Attends Religious Services:   . Active Member of Clubs or Organizations:   . Attends Archivist Meetings:   Marland Kitchen Marital Status:   Intimate Partner Violence:   . Fear of Current or Ex-Partner:   . Emotionally Abused:   Marland Kitchen Physically Abused:   . Sexually Abused:     FAMILY HISTORY: Family History  Problem Relation Age of Onset  . Hypertension Mother   . Hypertension Father   . Stomach cancer Father        in 54s- survived.   Marland Kitchen Heart disease Brother     ALLERGIES:  has No Known Allergies.  MEDICATIONS:  Current Outpatient Medications  Medication Sig Dispense Refill  . azelastine (ASTELIN) 0.1 % nasal spray Place 2 sprays into both nostrils 2 (two) times daily. Use in each nostril as directed 30 mL 12  . dicyclomine (BENTYL) 10 MG capsule Take 1 capsule (10 mg total) by mouth 4 (four) times daily -  before meals and at bedtime. 120 capsule 1  . HYDROcodone-acetaminophen (NORCO/VICODIN) 5-325 MG tablet Take 1 tablet by mouth every 8 (eight)  hours as needed for moderate pain. 45 tablet 0  . lidocaine-prilocaine (EMLA) cream Apply 1 application topically as needed. (Patient taking differently: Apply 1 application topically as needed (port access). ) 30 g 0  . montelukast (SINGULAIR) 10 MG tablet Take 1 tablet (10 mg total) by mouth at bedtime. 30 tablet 3  . naproxen sodium (ALEVE) 220 MG tablet Take 220 mg by mouth 2 (two) times daily as needed (pain).    . ondansetron (ZOFRAN) 8 MG tablet One pill every 8 hours as needed for nausea/vomitting. 40 tablet 1  . oxymetazoline (AFRIN) 0.05 % nasal spray Place 1 spray into both nostrils 2 (two) times daily as needed for congestion.    . prochlorperazine (COMPAZINE) 10 MG tablet Take 1 tablet (10 mg total) by mouth every 6 (six) hours as needed for nausea or vomiting. 40  tablet 1   No current facility-administered medications for this visit.   Facility-Administered Medications Ordered in Other Visits  Medication Dose Route Frequency Provider Last Rate Last Admin  . dexamethasone (DECADRON) 10 mg in sodium chloride 0.9 % 50 mL IVPB  10 mg Intravenous Once Cammie Sickle, MD 204 mL/hr at 02/28/20 0934 10 mg at 02/28/20 0934  . dextrose 5 % solution   Intravenous Once Charlaine Dalton R, MD      . fluorouracil (ADRUCIL) 6,050 mg in sodium chloride 0.9 % 129 mL chemo infusion  2,400 mg/m2 (Treatment Plan Recorded) Intravenous 1 day or 1 dose Charlaine Dalton R, MD      . heparin lock flush 100 unit/mL  500 Units Intravenous Once Charlaine Dalton R, MD      . iron sucrose (VENOFER) injection 200 mg  200 mg Intravenous Once Charlaine Dalton R, MD      . leucovorin 1,000 mg in dextrose 5 % 250 mL infusion  397 mg/m2 (Treatment Plan Recorded) Intravenous Once Charlaine Dalton R, MD      . oxaliplatin (ELOXATIN) 200 mg in dextrose 5 % 500 mL chemo infusion  79 mg/m2 (Treatment Plan Recorded) Intravenous Once Cammie Sickle, MD          .  PHYSICAL EXAMINATION: ECOG PERFORMANCE STATUS: 1 - Symptomatic but completely ambulatory  Vitals:   02/28/20 0815  BP: 126/78  Pulse: 71  Resp: 20  Temp: (!) 97 F (36.1 C)   Filed Weights   02/28/20 0901  Weight: (!) 260 lb (117.9 kg)    Physical Exam HENT:     Head: Normocephalic and atraumatic.     Mouth/Throat:     Pharynx: No oropharyngeal exudate.  Eyes:     Pupils: Pupils are equal, round, and reactive to light.  Cardiovascular:     Rate and Rhythm: Normal rate and regular rhythm.  Pulmonary:     Effort: Pulmonary effort is normal. No respiratory distress.     Breath sounds: Normal breath sounds. No wheezing.  Abdominal:     General: Bowel sounds are normal. There is no distension.     Palpations: Abdomen is soft. There is no mass.     Tenderness: There is no abdominal  tenderness. There is no guarding or rebound.  Musculoskeletal:        General: No tenderness. Normal range of motion.     Cervical back: Normal range of motion and neck supple.  Skin:    General: Skin is warm.  Neurological:     Mental Status: He is alert and oriented to person, place, and time.  Psychiatric:  Mood and Affect: Affect normal.      LABORATORY DATA:  I have reviewed the data as listed Lab Results  Component Value Date   WBC 11.3 (H) 02/28/2020   HGB 11.6 (L) 02/28/2020   HCT 36.9 (L) 02/28/2020   MCV 81.1 02/28/2020   PLT 151 02/28/2020   Recent Labs    01/31/20 0816 02/14/20 0807 02/28/20 0815  NA 140 141 140  K 3.8 3.7 3.9  CL 106 105 106  CO2 25 25 26   GLUCOSE 107* 106* 105*  BUN 21* 18 19  CREATININE 1.15 1.25* 1.22  CALCIUM 8.9 9.0 9.2  GFRNONAA >60 >60 >60  GFRAA >60 >60 >60  PROT 7.7 7.8 7.8  ALBUMIN 3.9 3.8 3.8  AST 30 26 23   ALT 30 27 23   ALKPHOS 91 130* 149*  BILITOT 0.6 0.5 0.6    RADIOGRAPHIC STUDIES: I have personally reviewed the radiological images as listed and agreed with the findings in the report. No results found.  ASSESSMENT & PLAN:   Rectal cancer ([place]) #Rectal adenocarcinoma- moderate differentiated; stage III [multiple pelvic lymph nodes]; question borderline 9 mm retroperitoneal neuropathy. PET-bulky rectal mass; multiple lymph nodes noted; no uptake in the retroperitoneal lymph nodes; MRI rectum- T3N2M0;   Pretreatment CEA 94. ON TNT [total neoadjuvant therapy]; currently on FOLFOX chemotherapy.  #Proceed with cycle #5 of FOLFOX chemotherapy; Labs today reviewed;  acceptable for treatment.  Home Growth factor support.  We will make a referral to radiation.  Understands patient will get chemoradiation-which will be followed by surgery after approximately 2 months or so.  # Nose bleeds- G-1-STABLE.   # PN-cold sensistivity-G-1- STABLE  #Iron deficient anemia/chronic GI bleed-Hb 9.5-STABLE;  -plan IV iron  today  # Peri-rectal pain- improved;  vicodin prn.   # Weight loss- improving. Monitor closely.   #Memory deficits-question "chemo brain"-no concerns for any brain metastases.  Monitor closely.  DISPOSITION:  # Referral to Dr.Crystal re: rectal cancer  # chemo today; Venofer ; pump off in 2 days;  #Follow-up in 2 weeks; MD; labs CBC CMP; CEA; Venofer; FOLFOX   D-3- pump off-Dr.B   All questions were answered. The patient knows to call the clinic with any problems, questions or concerns.    Cammie Sickle, MD 02/28/2020 9:38 AM

## 2020-02-29 LAB — CEA: CEA: 7.2 ng/mL — ABNORMAL HIGH (ref 0.0–4.7)

## 2020-03-01 ENCOUNTER — Other Ambulatory Visit: Payer: Self-pay

## 2020-03-01 ENCOUNTER — Inpatient Hospital Stay: Payer: 59

## 2020-03-01 ENCOUNTER — Encounter: Payer: Self-pay | Admitting: Radiation Oncology

## 2020-03-01 ENCOUNTER — Ambulatory Visit
Admission: RE | Admit: 2020-03-01 | Discharge: 2020-03-01 | Disposition: A | Discharge status: Home / Self Care | Payer: 59 | Source: Ambulatory Visit | Attending: Radiation Oncology | Admitting: Radiation Oncology

## 2020-03-01 VITALS — BP 123/82 | HR 72 | Temp 97.2°F | Wt 259.0 lb

## 2020-03-01 VITALS — BP 117/68 | HR 78 | Temp 97.4°F | Resp 18

## 2020-03-01 DIAGNOSIS — Z87891 Personal history of nicotine dependence: Secondary | ICD-10-CM | POA: Diagnosis not present

## 2020-03-01 DIAGNOSIS — C20 Malignant neoplasm of rectum: Secondary | ICD-10-CM

## 2020-03-01 DIAGNOSIS — Z79899 Other long term (current) drug therapy: Secondary | ICD-10-CM | POA: Insufficient documentation

## 2020-03-01 DIAGNOSIS — R599 Enlarged lymph nodes, unspecified: Secondary | ICD-10-CM | POA: Diagnosis not present

## 2020-03-01 DIAGNOSIS — Z800 Family history of malignant neoplasm of digestive organs: Secondary | ICD-10-CM | POA: Diagnosis not present

## 2020-03-01 DIAGNOSIS — Z5111 Encounter for antineoplastic chemotherapy: Secondary | ICD-10-CM | POA: Diagnosis not present

## 2020-03-01 MED ORDER — SODIUM CHLORIDE 0.9% FLUSH
10.0000 mL | INTRAVENOUS | Status: DC | PRN
  Administered 2020-03-01: 10 mL
  Filled 2020-03-01: qty 10

## 2020-03-01 MED ORDER — HEPARIN SOD (PORK) LOCK FLUSH 100 UNIT/ML IV SOLN
INTRAVENOUS | Status: AC
  Filled 2020-03-01: qty 5

## 2020-03-01 MED ORDER — HEPARIN SOD (PORK) LOCK FLUSH 100 UNIT/ML IV SOLN
500.0000 [IU] | Freq: Once | INTRAVENOUS | Status: AC | PRN
  Administered 2020-03-01: 500 [IU]
  Filled 2020-03-01: qty 5

## 2020-03-01 NOTE — Consult Note (Signed)
NEW PATIENT EVALUATION  Name: Stephen Vasquez  MRN: [id number]  Date:   03/01/2020     DOB: [birth year]   This 59 y.o. male patient presents to the clinic for initial evaluation of locally advanced adenocarcinoma of the distal rectum stage III (T3 N2 M0) completing neoadjuvant FOLFOX chemotherapy TNT.  REFERRING PHYSICIAN: Volney American,*  CHIEF COMPLAINT:  Chief Complaint  Patient presents with  . Rectal Cancer    DIAGNOSIS: The encounter diagnosis was Rectal cancer ([place]).   PREVIOUS INVESTIGATIONS:  Pathology report reviewed PET scan MRI scans reviewed Clinical notes reviewed  HPI: Patient is a 59 year old male who presented with rectal bleeding.  Initial work-up including clot colonoscopy showed a partial obstructing circumferential rectal mass starting at the anal verge.  Mass was approximately 6.7 cm.  There were multiple perirectal lymph nodes consistent with mesenteric metastatic adenopathy.  He underwent a PET CT scan showing diffuse hypermetabolic rectal wall thickening and large hypermetabolic left perirectal mass extending to the left pelvic sidewall consistent with known rectal carcinoma.  He had hypermetabolic perirectal lymphadenopathy consistent with lymph node metastasis.  He had subcentimeter left internal/external iliac nodes also with low-grade FDG uptake suspicious for lymph node metastasis.  No evidence of distant metastatic disease was noted.  Biopsy was positive for invasive colorectal adenocarcinoma moderately differentiated.  He has been started on TNT currently at his fifth cycle of FOLFOX chemotherapy which she is tolerated fairly well.  He states he is having no rectal bleeding or significant pelvic pain.  He is now referred to ration collagen for consideration of neoadjuvant concurrent chemo and radiation.  He will then be referred for surgical resection.  PLANNED TREATMENT REGIMEN: Concurrent neoadjuvant chemoradiation therapy prior to surgical  resection  PAST MEDICAL HISTORY:  has a past medical history of Allergic rhinitis, Heart murmur, and Rectal cancer ([place]).    PAST SURGICAL HISTORY:  Past Surgical History:  Procedure Laterality Date  . COLONOSCOPY WITH PROPOFOL N/A 12/15/2019   Procedure: COLONOSCOPY WITH PROPOFOL;  Surgeon: [name], MD;  Location: [facility] ENDOSCOPY;  Service: Gastroenterology;  Laterality: N/A;  . PORTACATH PLACEMENT Left 12/29/2019   Procedure: INSERTION PORT-A-CATH;  Surgeon: Robert Bellow, MD;  Location: [facility] ORS;  Service: General;  Laterality: Left;    FAMILY HISTORY: family history includes Heart disease in his brother; Hypertension in his father and mother; Stomach cancer in his father.  SOCIAL HISTORY:  reports that he has quit smoking. He has never used smokeless tobacco. He reports previous alcohol use. He reports that he does not use drugs.  ALLERGIES: Patient has no known allergies.  MEDICATIONS:  Current Outpatient Medications  Medication Sig Dispense Refill  . azelastine (ASTELIN) 0.1 % nasal spray Place 2 sprays into both nostrils 2 (two) times daily. Use in each nostril as directed 30 mL 12  . HYDROcodone-acetaminophen (NORCO/VICODIN) 5-325 MG tablet Take 1 tablet by mouth every 8 (eight) hours as needed for moderate pain. 45 tablet 0  . lidocaine-prilocaine (EMLA) cream Apply 1 application topically as needed. (Patient taking differently: Apply 1 application topically as needed (port access). ) 30 g 0  . montelukast (SINGULAIR) 10 MG tablet Take 1 tablet (10 mg total) by mouth at bedtime. 30 tablet 3  . naproxen sodium (ALEVE) 220 MG tablet Take 220 mg by mouth 2 (two) times daily as needed (pain).    . ondansetron (ZOFRAN) 8 MG tablet One pill every 8 hours as needed for nausea/vomitting. 40 tablet 1  . oxymetazoline (AFRIN) 0.05 %  nasal spray Place 1 spray into both nostrils 2 (two) times daily as needed for congestion.    . prochlorperazine (COMPAZINE) 10 MG tablet Take 1 tablet  (10 mg total) by mouth every 6 (six) hours as needed for nausea or vomiting. 40 tablet 1  . dicyclomine (BENTYL) 10 MG capsule Take 1 capsule (10 mg total) by mouth 4 (four) times daily -  before meals and at bedtime. (Patient not taking: Reported on 03/01/2020) 120 capsule 1   No current facility-administered medications for this encounter.   Facility-Administered Medications Ordered in Other Encounters  Medication Dose Route Frequency Provider Last Rate Last Admin  . sodium chloride flush (NS) 0.9 % injection 10 mL  10 mL Intracatheter PRN Cammie Sickle, MD   10 mL at 03/01/20 1317    ECOG PERFORMANCE STATUS:  0 - Asymptomatic  REVIEW OF SYSTEMS: Patient denies any weight loss, fatigue, weakness, fever, chills or night sweats. Patient denies any loss of vision, blurred vision. Patient denies any ringing  of the ears or hearing loss. No irregular heartbeat. Patient denies heart murmur or history of fainting. Patient denies any chest pain or pain radiating to her upper extremities. Patient denies any shortness of breath, difficulty breathing at night, cough or hemoptysis. Patient denies any swelling in the lower legs. Patient denies any nausea vomiting, vomiting of blood, or coffee ground material in the vomitus. Patient denies any stomach pain. Patient states has had normal bowel movements no significant constipation or diarrhea. Patient denies any dysuria, hematuria or significant nocturia. Patient denies any problems walking, swelling in the joints or loss of balance. Patient denies any skin changes, loss of hair or loss of weight. Patient denies any excessive worrying or anxiety or significant depression. Patient denies any problems with insomnia. Patient denies excessive thirst, polyuria, polydipsia. Patient denies any swollen glands, patient denies easy bruising or easy bleeding. Patient denies any recent infections, allergies or URI. Patient "s visual fields have not changed significantly  in recent time.   PHYSICAL EXAM: BP 123/82 (BP Location: Left Arm, Patient Position: Sitting, Cuff Size: Normal)   Pulse 72   Temp (!) 97.2 F (36.2 C) (Tympanic)   Wt (!) 259 lb (117.5 kg)   BMI 34.17 kg/m  Well-developed well-nourished patient in NAD. HEENT reveals PERLA, EOMI, discs not visualized.  Oral cavity is clear. No oral mucosal lesions are identified. Neck is clear without evidence of cervical or supraclavicular adenopathy. Lungs are clear to A&P. Cardiac examination is essentially unremarkable with regular rate and rhythm without murmur rub or thrill. Abdomen is benign with no organomegaly or masses noted. Motor sensory and DTR levels are equal and symmetric in the upper and lower extremities. Cranial nerves II through XII are grossly intact. Proprioception is intact. No peripheral adenopathy or edema is identified. No motor or sensory levels are noted. Crude visual fields are within normal range.  LABORATORY DATA: Pathology report reviewed    RADIOLOGY RESULTS: MRI scans and PET CT scans reviewed compatible with above-stated findings   IMPRESSION: Stage III locally advanced colorectal cancer status post TNT in 59 year old male for concurrent chemoradiation also neoadjuvant setting.  PLAN: At this time like to go ahead with whole pelvic radiation therapy.  Would plan on delivering 4500 centigrade over 5 weeks would also boost primary tumor up to 540 cGy.  We use PET CT scan for treatment planning.  Risks and benefits of treatment including increased lower urinary tract symptoms possible diarrhea fatigue alteration of blood counts  skin reaction all were described in detail with the patient and his wife.  They both seem to comprehend my treatment plan well.  I have set him up for CT simulation in about 3 weeks time.  Patient comprehends my treatment plan well.  There will be extra effort by both professional staff as well as technical staff to coordinate and manage concurrent  chemoradiation and ensuing side effects during his treatments.   I would like to take this opportunity to thank you for allowing me to participate in the care of your patient.Noreene Filbert, MD

## 2020-03-02 ENCOUNTER — Inpatient Hospital Stay: Payer: 59

## 2020-03-02 ENCOUNTER — Other Ambulatory Visit: Payer: Self-pay

## 2020-03-02 NOTE — Progress Notes (Signed)
Nutrition Assessment   Reason for Assessment:  Weight loss   ASSESSMENT:  59 year old male with rectal cancer. Past medical history of heart murmur.   Patient currently receiving folfox chemotherapy with growth factor support.  Planning chemoradiation followed by surgery.    Met with patient and significant other [name].  Patient reports that he has a good appetite.  Reports that once diagnosis he stopped drinking 2-3 Liters of Mt Dew/surgary drinks, stopped eating fried foods, red meat and pork, and started making healthy food choices which has created this weight loss.  Typical day is eating apple and drinking [employer] shake (20 g plant based protein, 400 calories), sometimes Kind bar.  Lunch is [place] sandwich.  Tends to eat smaller, more frequent meals (ie pretzels).  Does not like many vegetables and states allergy to vegetables.  Able to eat corn, beans (all kinds), potatoes.  Drinking water and gatorade zero now.   Does reports cold sensitivity for few days after treatment.     Medications: bentyl, zofran, compazine   Labs: glucose 106, BUN 18, creatinine 1.25   Anthropometrics:   Height: 73 inches Weight: 259 lb Noted 272 lb on 5/20 269 lb on 6/14 BMI: 34  5% weight loss in the last 2 months, not significant.     Estimated Energy Needs  Kcals: 8309- 2950 Protein: 126-168 g Fluid: > 2.6 L   NUTRITION DIAGNOSIS:    INTERVENTION:  Weight loss can be attributed to patient making healthy food choices (lower in calories).   Discussed importance of slowing weight loss down/maintaining during treatment.  Discussed ways to increase calories and protein (increasing portion size of healthy options, increasing heart healthy fats, etc) Patient to continue drinking shake 1 time per day. If weight continues to decrease recommend increasing to BID.  Contact information provided   MONITORING, EVALUATION, GOAL: weight trends, intake   Next Visit: August 23rd, during  infusion  Winda Summerall B. Zenia Resides, [place], [place] Registered Dietitian [phone] (mobile)

## 2020-03-02 NOTE — Progress Notes (Signed)
After reviewing all data I would like to go ahead with IMRT radiation therapy to his pelvis.  I would use a dose painting technique to high-dose those lymph nodes that are PET positive to 60 Gy treating the remainder of his pelvic lymph nodes as well as his tumor which will be resected to 50 Gy using IMRT dose painting technique.  This would have allow me to increase dose to his lymph nodes that will not be resected at the time of surgery as well as spare critical structures such as his bladder small bowel and bone marrow.

## 2020-03-13 ENCOUNTER — Inpatient Hospital Stay: Payer: 59 | Attending: Internal Medicine

## 2020-03-13 ENCOUNTER — Encounter: Payer: Self-pay | Admitting: Internal Medicine

## 2020-03-13 ENCOUNTER — Other Ambulatory Visit: Payer: Self-pay

## 2020-03-13 ENCOUNTER — Other Ambulatory Visit: Payer: Self-pay | Admitting: *Deleted

## 2020-03-13 ENCOUNTER — Inpatient Hospital Stay (HOSPITAL_BASED_OUTPATIENT_CLINIC_OR_DEPARTMENT_OTHER): Payer: 59 | Admitting: Internal Medicine

## 2020-03-13 ENCOUNTER — Inpatient Hospital Stay: Payer: 59

## 2020-03-13 DIAGNOSIS — D509 Iron deficiency anemia, unspecified: Secondary | ICD-10-CM | POA: Diagnosis not present

## 2020-03-13 DIAGNOSIS — Z5111 Encounter for antineoplastic chemotherapy: Secondary | ICD-10-CM | POA: Insufficient documentation

## 2020-03-13 DIAGNOSIS — C20 Malignant neoplasm of rectum: Secondary | ICD-10-CM | POA: Diagnosis not present

## 2020-03-13 LAB — COMPREHENSIVE METABOLIC PANEL
ALT: 31 U/L (ref 0–44)
AST: 31 U/L (ref 15–41)
Albumin: 3.9 g/dL (ref 3.5–5.0)
Alkaline Phosphatase: 160 U/L — ABNORMAL HIGH (ref 38–126)
Anion gap: 10 (ref 5–15)
BUN: 24 mg/dL — ABNORMAL HIGH (ref 6–20)
CO2: 24 mmol/L (ref 22–32)
Calcium: 9.1 mg/dL (ref 8.9–10.3)
Chloride: 107 mmol/L (ref 98–111)
Creatinine, Ser: 1.01 mg/dL (ref 0.61–1.24)
GFR calc Af Amer: >60 mL/min (ref >60)
GFR calc non Af Amer: >60 mL/min (ref >60)
Glucose, Bld: 102 mg/dL — ABNORMAL HIGH (ref 70–99)
Potassium: 3.9 mmol/L (ref 3.5–5.1)
Sodium: 141 mmol/L (ref 135–145)
Total Bilirubin: 0.6 mg/dL (ref 0.3–1.2)
Total Protein: 8.1 g/dL (ref 6.5–8.1)

## 2020-03-13 LAB — CBC WITH DIFFERENTIAL/PLATELET
Abs Immature Granulocytes: 0.07 10*3/uL (ref 0.00–0.07)
Basophils Absolute: 0.1 10*3/uL (ref 0.0–0.1)
Basophils Relative: 1 %
Eosinophils Absolute: 0.1 10*3/uL (ref 0.0–0.5)
Eosinophils Relative: 1 %
HCT: 38.2 % — ABNORMAL LOW (ref 39.0–52.0)
Hemoglobin: 12.1 g/dL — ABNORMAL LOW (ref 13.0–17.0)
Immature Granulocytes: 1 %
Lymphocytes Relative: 19 %
Lymphs Abs: 1.7 10*3/uL (ref 0.7–4.0)
MCH: 26.1 pg (ref 26.0–34.0)
MCHC: 31.7 g/dL (ref 30.0–36.0)
MCV: 82.5 fL (ref 80.0–100.0)
Monocytes Absolute: 0.9 10*3/uL (ref 0.1–1.0)
Monocytes Relative: 10 %
Neutro Abs: 6.1 10*3/uL (ref 1.7–7.7)
Neutrophils Relative %: 68 %
Platelets: 136 10*3/uL — ABNORMAL LOW (ref 150–400)
RBC: 4.63 MIL/uL (ref 4.22–5.81)
RDW: 23.2 % — ABNORMAL HIGH (ref 11.5–15.5)
WBC: 8.8 10*3/uL (ref 4.0–10.5)
nRBC: 0.2 % (ref 0.0–0.2)

## 2020-03-13 MED ORDER — OXALIPLATIN CHEMO INJECTION 100 MG/20ML
79.0000 mg/m2 | Freq: Once | INTRAVENOUS | Status: AC
  Administered 2020-03-13: 200 mg via INTRAVENOUS
  Filled 2020-03-13: qty 40

## 2020-03-13 MED ORDER — SODIUM CHLORIDE 0.9 % IV SOLN
10.0000 mg | Freq: Once | INTRAVENOUS | Status: AC
  Administered 2020-03-13: 10 mg via INTRAVENOUS
  Filled 2020-03-13: qty 10

## 2020-03-13 MED ORDER — DEXTROSE 5 % IV SOLN
Freq: Once | INTRAVENOUS | Status: AC
  Filled 2020-03-13: qty 250

## 2020-03-13 MED ORDER — SODIUM CHLORIDE 0.9 % IV SOLN
Freq: Once | INTRAVENOUS | Status: AC
  Filled 2020-03-13: qty 250

## 2020-03-13 MED ORDER — PALONOSETRON HCL INJECTION 0.25 MG/5ML
0.2500 mg | Freq: Once | INTRAVENOUS | Status: AC
  Administered 2020-03-13: 0.25 mg via INTRAVENOUS
  Filled 2020-03-13: qty 5

## 2020-03-13 MED ORDER — LEUCOVORIN CALCIUM INJECTION 350 MG
397.0000 mg/m2 | Freq: Once | INTRAVENOUS | Status: AC
  Administered 2020-03-13: 1000 mg via INTRAVENOUS
  Filled 2020-03-13: qty 50

## 2020-03-13 MED ORDER — SODIUM CHLORIDE 0.9 % IV SOLN
2400.0000 mg/m2 | INTRAVENOUS | Status: DC
  Administered 2020-03-13: 6050 mg via INTRAVENOUS
  Filled 2020-03-13: qty 121

## 2020-03-13 MED ORDER — SODIUM CHLORIDE 0.9% FLUSH
10.0000 mL | Freq: Once | INTRAVENOUS | Status: AC
  Administered 2020-03-13: 10 mL
  Filled 2020-03-13: qty 10

## 2020-03-13 MED ORDER — SODIUM CHLORIDE 0.9 % IV SOLN: 200.0000 mg | Freq: Once | INTRAVENOUS | Status: DC

## 2020-03-13 MED ORDER — IRON SUCROSE 20 MG/ML IV SOLN
200.0000 mg | Freq: Once | INTRAVENOUS | Status: AC
  Administered 2020-03-13: 200 mg via INTRAVENOUS
  Filled 2020-03-13: qty 10

## 2020-03-13 NOTE — Progress Notes (Signed)
[place] CONSULT NOTE  Patient Care Team: Volney American, PA-C as PCP - General (Family Medicine) Clent Jacks, RN as Oncology Nurse Navigator [name], MD as Consulting Physician (Gastroenterology) Cammie Sickle, MD as Consulting Physician (Internal Medicine)  CHIEF COMPLAINTS/PURPOSE OF CONSULTATION: Rectal cancer  #  Oncology History Overview Note  # MAY 2021-rectal adenocarcinoma ; moderately differentiated; [Dr.Anna]; colonoscopy-partially obstructing circumferential rectal mass-starting at anal verge to proximal. CEA- 93. CT C/A/P- Large mass involving the rectum is identified compatible with primary rectal carcinoma. This has a large, 6.7 cm exophytic component in the left pelvis. Multiple perirectal lymph nodes are identified compatible with metastatic adenopathy; Borderline enlarged aortocaval node measures 0.9 cm; PET-bulky rectal mass; multiple lymph nodes noted; no uptake in the retroperitoneal lymph nodes; MRI rectum- T3N2M0;   Pretreatment CEA 94; TNT  # June 1st 2021- FOLFOX  # SURVIVORSHIP:   # GENETICS:   DIAGNOSIS: RECTAL CA  STAGE:   III     ;  GOALS: cure  CURRENT/MOST RECENT THERAPY : FOLFOX [C]   Rectal cancer ([place])  12/20/2019 Initial Diagnosis   Rectal cancer ([place])   01/04/2020 -  Chemotherapy   The patient had dexamethasone (DECADRON) 4 MG tablet, 8 mg, Oral, Daily, 1 of 1 cycle, Start date: --, End date: -- palonosetron (ALOXI) injection 0.25 mg, 0.25 mg, Intravenous,  Once, 5 of 8 cycles Administration: 0.25 mg (01/04/2020), 0.25 mg (01/17/2020), 0.25 mg (01/31/2020), 0.25 mg (02/14/2020), 0.25 mg (02/28/2020) pegfilgrastim (NEULASTA ONPRO KIT) injection 6 mg, 6 mg, Subcutaneous, Once, 2 of 2 cycles Administration: 6 mg (02/02/2020) leucovorin 1,000 mg in dextrose 5 % 250 mL infusion, 1,008 mg, Intravenous,  Once, 5 of 8 cycles Administration: 1,000 mg (01/04/2020), 1,000 mg (01/17/2020), 1,000 mg (01/31/2020), 1,000 mg  (02/14/2020), 1,000 mg (02/28/2020) oxaliplatin (ELOXATIN) 200 mg in dextrose 5 % 500 mL chemo infusion, 215 mg, Intravenous,  Once, 5 of 8 cycles Administration: 200 mg (01/04/2020), 200 mg (01/17/2020), 200 mg (01/31/2020), 200 mg (02/14/2020), 200 mg (02/28/2020) fluorouracil (ADRUCIL) chemo injection 1,000 mg, 400 mg/m2 = 1,000 mg, Intravenous,  Once, 2 of 2 cycles Administration: 1,000 mg (01/04/2020), 1,000 mg (01/17/2020) fluorouracil (ADRUCIL) 6,050 mg in sodium chloride 0.9 % 129 mL chemo infusion, 2,400 mg/m2 = 6,050 mg, Intravenous, 1 Day/Dose, 5 of 8 cycles Administration: 6,050 mg (01/04/2020), 6,050 mg (01/17/2020), 6,050 mg (01/31/2020), 6,050 mg (02/14/2020), 6,050 mg (02/28/2020)  for chemotherapy treatment.       HISTORY OF PRESENTING ILLNESS:  Stephen Vasquez 59 y.o.  male  With stage III rectal cancer currently on TNT-FOLFOX chemotherapy is here for follow-up.  In the interim patient was evaluated by radiation oncology; plan to start radiation after finishing 8 cycles of treatment.  No fever no chills.  No headaches.  Lower perirectal pain improved.   Continues to have cold sensitivity especially in the tongue.  Review of Systems  Constitutional: Negative for chills, diaphoresis, fever, malaise/fatigue and weight loss.  HENT: Negative for nosebleeds and sore throat.   Eyes: Negative for double vision.  Respiratory: Negative for cough, hemoptysis, sputum production, shortness of breath and wheezing.   Cardiovascular: Negative for chest pain, palpitations, orthopnea and leg swelling.  Gastrointestinal: Positive for blood in stool. Negative for abdominal pain, constipation, diarrhea, heartburn, melena, nausea and vomiting.  Genitourinary: Negative for dysuria, frequency and urgency.  Musculoskeletal: Positive for joint pain. Negative for back pain.  Skin: Negative.  Negative for itching and rash.  Neurological: Negative for dizziness, tingling,  focal weakness, weakness and headaches.   Endo/Heme/Allergies: Does not bruise/bleed easily.  Psychiatric/Behavioral: Negative for depression. The patient is not nervous/anxious and does not have insomnia.      MEDICAL HISTORY:  Past Medical History:  Diagnosis Date  . Allergic rhinitis   . Heart murmur   . Rectal cancer [facility])     SURGICAL HISTORY: Past Surgical History:  Procedure Laterality Date  . COLONOSCOPY WITH PROPOFOL N/A 12/15/2019   Procedure: COLONOSCOPY WITH PROPOFOL;  Surgeon: [name], MD;  [facility];  Service: Gastroenterology;  Laterality: N/A;  . PORTACATH PLACEMENT Left 12/29/2019   Procedure: INSERTION PORT-A-CATH;  Surgeon: Robert Bellow, MD;  Location: [facility] ORS;  Service: General;  Laterality: Left;    SOCIAL HISTORY: Social History   Socioeconomic History  . Marital status: Married    Spouse name: Not on file  . Number of children: Not on file  . Years of education: Not on file  . Highest education level: Not on file  Occupational History  . Not on file  Tobacco Use  . Smoking status: Former Research scientist (life sciences)  . Smokeless tobacco: Never Used  Vaping Use  . Vaping Use: Never used  Substance and Sexual Activity  . Alcohol use: Not Currently  . Drug use: Never  . Sexual activity: Yes  Other Topics Concern  . Not on file  Social History Narrative   Lives in [place]; Freight forwarder at [employer]; 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 2 children- in boy and girls in late 51s.    Social Determinants of Health   Financial Resource Strain:   . Difficulty of Paying Living Expenses:   Food Insecurity:   . Worried About Charity fundraiser in the Last Year:   . Arboriculturist in the Last Year:   Transportation Needs:   . Film/video editor (Medical):   Marland Kitchen Lack of Transportation (Non-Medical):   Physical Activity:   . Days of Exercise per Week:   . Minutes of Exercise per Session:   Stress:   . Feeling of Stress :   Social Connections:   . Frequency of Communication with  Friends and Family:   . Frequency of Social Gatherings with Friends and Family:   . Attends Religious Services:   . Active Member of Clubs or Organizations:   . Attends Archivist Meetings:   Marland Kitchen Marital Status:   Intimate Partner Violence:   . Fear of Current or Ex-Partner:   . Emotionally Abused:   Marland Kitchen Physically Abused:   . Sexually Abused:     FAMILY HISTORY: Family History  Problem Relation Age of Onset  . Hypertension Mother   . Hypertension Father   . Stomach cancer Father        in 34s- survived.   Marland Kitchen Heart disease Brother     ALLERGIES:  has No Known Allergies.  MEDICATIONS:  Current Outpatient Medications  Medication Sig Dispense Refill  . azelastine (ASTELIN) 0.1 % nasal spray Place 2 sprays into both nostrils 2 (two) times daily. Use in each nostril as directed 30 mL 12  . HYDROcodone-acetaminophen (NORCO/VICODIN) 5-325 MG tablet Take 1 tablet by mouth every 8 (eight) hours as needed for moderate pain. 45 tablet 0  . lidocaine-prilocaine (EMLA) cream Apply 1 application topically as needed. (Patient taking differently: Apply 1 application topically as needed (port access). ) 30 g 0  . montelukast (SINGULAIR) 10 MG tablet Take 1 tablet (10 mg total) by mouth at  bedtime. 30 tablet 3  . naproxen sodium (ALEVE) 220 MG tablet Take 220 mg by mouth 2 (two) times daily as needed (pain).    . ondansetron (ZOFRAN) 8 MG tablet One pill every 8 hours as needed for nausea/vomitting. 40 tablet 1  . oxymetazoline (AFRIN) 0.05 % nasal spray Place 1 spray into both nostrils 2 (two) times daily as needed for congestion.    . prochlorperazine (COMPAZINE) 10 MG tablet Take 1 tablet (10 mg total) by mouth every 6 (six) hours as needed for nausea or vomiting. 40 tablet 1  . dicyclomine (BENTYL) 10 MG capsule Take 1 capsule (10 mg total) by mouth 4 (four) times daily -  before meals and at bedtime. (Patient not taking: Reported on 03/01/2020) 120 capsule 1   No current  facility-administered medications for this visit.      Marland Kitchen  PHYSICAL EXAMINATION: ECOG PERFORMANCE STATUS: 1 - Symptomatic but completely ambulatory  Vitals:   03/13/20 0825  BP: 109/81  Pulse: 62  Resp: 16  Temp: (!) 96.2 F (35.7 C)  SpO2: 100%   Filed Weights   03/13/20 0825  Weight: 256 lb (116.1 kg)    Physical Exam Constitutional:      Comments: Accompanied by his wife.  HENT:     Head: Normocephalic and atraumatic.     Mouth/Throat:     Pharynx: No oropharyngeal exudate.  Eyes:     Pupils: Pupils are equal, round, and reactive to light.  Cardiovascular:     Rate and Rhythm: Normal rate and regular rhythm.  Pulmonary:     Effort: Pulmonary effort is normal. No respiratory distress.     Breath sounds: Normal breath sounds. No wheezing.  Abdominal:     General: Bowel sounds are normal. There is no distension.     Palpations: Abdomen is soft. There is no mass.     Tenderness: There is no abdominal tenderness. There is no guarding or rebound.  Musculoskeletal:        General: No tenderness. Normal range of motion.     Cervical back: Normal range of motion and neck supple.  Skin:    General: Skin is warm.  Neurological:     Mental Status: He is alert and oriented to person, place, and time.  Psychiatric:        Mood and Affect: Affect normal.      LABORATORY DATA:  I have reviewed the data as listed Lab Results  Component Value Date   WBC 8.8 03/13/2020   HGB 12.1 (L) 03/13/2020   HCT 38.2 (L) 03/13/2020   MCV 82.5 03/13/2020   PLT 136 (L) 03/13/2020   Recent Labs    02/14/20 0807 02/28/20 0815 03/13/20 0805  NA 141 140 141  K 3.7 3.9 3.9  CL 105 106 107  CO2 25 26 24   GLUCOSE 106* 105* 102*  BUN 18 19 24*  CREATININE 1.25* 1.22 1.01  CALCIUM 9.0 9.2 9.1  GFRNONAA >60 >60 >60  GFRAA >60 >60 >60  PROT 7.8 7.8 8.1  ALBUMIN 3.8 3.8 3.9  AST 26 23 31   ALT 27 23 31   ALKPHOS 130* 149* 160*  BILITOT 0.5 0.6 0.6    RADIOGRAPHIC STUDIES: I  have personally reviewed the radiological images as listed and agreed with the findings in the report. No results found.  ASSESSMENT & PLAN:   Rectal cancer ([place]) #Rectal adenocarcinoma- moderate differentiated; stage III [multiple pelvic lymph nodes]; question borderline 9 mm retroperitoneal neuropathy. PET-bulky rectal  mass; multiple lymph nodes noted; no uptake in the retroperitoneal lymph nodes; MRI rectum- T3N2M0;   Pretreatment CEA 94. ON TNT [total neoadjuvant therapy]; currently on FOLFOX chemotherapy. CEA- pre-treatment- ~100; CEA- 7.2.  Stable  # Proceed with cycle #6 of FOLFOX chemotherapy; Labs today reviewed;  acceptable for treatment.  Home Growth factor support.    #Rising alkaline phosphatase-today 160; likely from chemotherapy.  Clinically not suggestive of progressive malignancy.  Monitor closely.  # Dental "cavities"-new;  Ok with dental consultation; recommend getting a clearance before any dental procedures/extractions.  # PN-cold sensistivity-G-1- STABLE;   # Iron deficient anemia/chronic GI bleed-Hb12.3;  -plan IV iron today  # Peri-rectal pain- Improved;  vicodin prn.    DISPOSITION:  # chemo today; Venofer ; pump off in 2 days;  #Follow-up in 2 weeks; MD; labs CBC CMP; CEA; Venofer; FOLFOX   D-3- pump off-Dr.B   All questions were answered. The patient knows to call the clinic with any problems, questions or concerns.    Cammie Sickle, MD 03/13/2020 8:59 AM

## 2020-03-13 NOTE — Assessment & Plan Note (Addendum)
#  Rectal adenocarcinoma- moderate differentiated; stage III [multiple pelvic lymph nodes]; question borderline 9 mm retroperitoneal neuropathy. PET-bulky rectal mass; multiple lymph nodes noted; no uptake in the retroperitoneal lymph nodes; MRI rectum- T3N2M0;   Pretreatment CEA 94. ON TNT [total neoadjuvant therapy]; currently on FOLFOX chemotherapy. CEA- pre-treatment- ~100; CEA- 7.2.  Stable  # Proceed with cycle #6 of FOLFOX chemotherapy; Labs today reviewed;  acceptable for treatment.  Home Growth factor support.    #Rising alkaline phosphatase-today 160; likely from chemotherapy.  Clinically not suggestive of progressive malignancy.  Monitor closely.  # Dental "cavities"-new;  Ok with dental consultation; recommend getting a clearance before any dental procedures/extractions.  # PN-cold sensistivity-G-1- STABLE;   # Iron deficient anemia/chronic GI bleed-Hb12.3;  -plan IV iron today  # Peri-rectal pain- Improved;  vicodin prn.    DISPOSITION:  # chemo today; Venofer ; pump off in 2 days;  #Follow-up in 2 weeks; MD; labs CBC CMP; CEA; Venofer; FOLFOX   D-3- pump off-Dr.B

## 2020-03-14 LAB — CEA: CEA: 4.2 ng/mL (ref 0.0–4.7)

## 2020-03-15 ENCOUNTER — Inpatient Hospital Stay: Payer: 59

## 2020-03-15 ENCOUNTER — Other Ambulatory Visit: Payer: Self-pay

## 2020-03-15 DIAGNOSIS — Z5111 Encounter for antineoplastic chemotherapy: Secondary | ICD-10-CM | POA: Diagnosis not present

## 2020-03-15 DIAGNOSIS — C20 Malignant neoplasm of rectum: Secondary | ICD-10-CM

## 2020-03-15 MED ORDER — HEPARIN SOD (PORK) LOCK FLUSH 100 UNIT/ML IV SOLN
500.0000 [IU] | Freq: Once | INTRAVENOUS | Status: AC | PRN
  Administered 2020-03-15: 500 [IU]
  Filled 2020-03-15: qty 5

## 2020-03-15 MED ORDER — HEPARIN SOD (PORK) LOCK FLUSH 100 UNIT/ML IV SOLN
INTRAVENOUS | Status: AC
  Filled 2020-03-15: qty 5

## 2020-03-15 MED ORDER — SODIUM CHLORIDE 0.9% FLUSH
10.0000 mL | INTRAVENOUS | Status: DC | PRN
  Administered 2020-03-15: 10 mL
  Filled 2020-03-15: qty 10

## 2020-03-27 ENCOUNTER — Ambulatory Visit: Payer: 59 | Admitting: Gastroenterology

## 2020-03-27 ENCOUNTER — Other Ambulatory Visit: Payer: Self-pay

## 2020-03-27 ENCOUNTER — Ambulatory Visit
Admission: RE | Admit: 2020-03-27 | Discharge: 2020-03-27 | Disposition: A | Discharge status: Home / Self Care | Payer: 59 | Source: Ambulatory Visit | Attending: Radiation Oncology | Admitting: Radiation Oncology

## 2020-03-27 ENCOUNTER — Inpatient Hospital Stay: Payer: 59

## 2020-03-27 DIAGNOSIS — C20 Malignant neoplasm of rectum: Secondary | ICD-10-CM | POA: Diagnosis present

## 2020-03-27 DIAGNOSIS — C775 Secondary and unspecified malignant neoplasm of intrapelvic lymph nodes: Secondary | ICD-10-CM | POA: Insufficient documentation

## 2020-03-27 NOTE — Progress Notes (Signed)
Nutrition Follow-up:  Patient with rectal cancer.  Patient currently receiving folfox chemotherapy.  Planning to start radiation on 9/1.    Met with patient and significant other Stephen Vasquez.  Patient reports that he has a good appetite.  Reports that he eats all day long.  Denies nausea except for few days after treatment but medications help.  Denies constipation, diarrhea.  Reports some fatigue.  Reports that he has gone back to eating more junk food.  Reports yesterday ate steak and cheese biscuit for breakfast. Chicken nuggets for lunch and cheese quesadilla for dinner.  Snacked on apple, granola, cheese its.    Medications: reviewed  Labs: reviewed  Anthropometrics:   Weight measured in clinic today 266 lb 5 oz increased from 259 lb on 7/29   INTERVENTION:  Encouraged well-balanced diet of healthy food options, weight maintenance during treatment.   Patient to call RD if appetite changes    NEXT VISIT: no follow-up, pt to call RD if intake, nutrition changes  Stephen Vasquez B. Zenia Resides, [place] Vera, [place] Registered Dietitian [phone] (mobile)

## 2020-03-28 ENCOUNTER — Inpatient Hospital Stay: Payer: 59

## 2020-03-28 ENCOUNTER — Encounter: Payer: Self-pay | Admitting: Internal Medicine

## 2020-03-28 ENCOUNTER — Inpatient Hospital Stay (HOSPITAL_BASED_OUTPATIENT_CLINIC_OR_DEPARTMENT_OTHER): Payer: 59 | Admitting: Internal Medicine

## 2020-03-28 VITALS — BP 114/78 | HR 73 | Temp 96.4°F | Resp 20 | Ht 73.0 in | Wt 254.6 lb

## 2020-03-28 DIAGNOSIS — C20 Malignant neoplasm of rectum: Secondary | ICD-10-CM | POA: Diagnosis not present

## 2020-03-28 DIAGNOSIS — D500 Iron deficiency anemia secondary to blood loss (chronic): Secondary | ICD-10-CM

## 2020-03-28 DIAGNOSIS — Z5111 Encounter for antineoplastic chemotherapy: Secondary | ICD-10-CM | POA: Diagnosis not present

## 2020-03-28 LAB — COMPREHENSIVE METABOLIC PANEL
ALT: 33 U/L (ref 0–44)
AST: 35 U/L (ref 15–41)
Albumin: 3.8 g/dL (ref 3.5–5.0)
Alkaline Phosphatase: 182 U/L — ABNORMAL HIGH (ref 38–126)
Anion gap: 7 (ref 5–15)
BUN: 19 mg/dL (ref 6–20)
CO2: 26 mmol/L (ref 22–32)
Calcium: 9.1 mg/dL (ref 8.9–10.3)
Chloride: 104 mmol/L (ref 98–111)
Creatinine, Ser: 1 mg/dL (ref 0.61–1.24)
GFR calc Af Amer: >60 mL/min (ref >60)
GFR calc non Af Amer: >60 mL/min (ref >60)
Glucose, Bld: 100 mg/dL — ABNORMAL HIGH (ref 70–99)
Potassium: 4 mmol/L (ref 3.5–5.1)
Sodium: 137 mmol/L (ref 135–145)
Total Bilirubin: 0.5 mg/dL (ref 0.3–1.2)
Total Protein: 8.2 g/dL — ABNORMAL HIGH (ref 6.5–8.1)

## 2020-03-28 LAB — CBC WITH DIFFERENTIAL/PLATELET
Abs Immature Granulocytes: 0.1 10*3/uL — ABNORMAL HIGH (ref 0.00–0.07)
Basophils Absolute: 0.1 10*3/uL (ref 0.0–0.1)
Basophils Relative: 1 %
Eosinophils Absolute: 0.1 10*3/uL (ref 0.0–0.5)
Eosinophils Relative: 1 %
HCT: 40.1 % (ref 39.0–52.0)
Hemoglobin: 13 g/dL (ref 13.0–17.0)
Immature Granulocytes: 1 %
Lymphocytes Relative: 15 %
Lymphs Abs: 1.6 10*3/uL (ref 0.7–4.0)
MCH: 27.8 pg (ref 26.0–34.0)
MCHC: 32.4 g/dL (ref 30.0–36.0)
MCV: 85.9 fL (ref 80.0–100.0)
Monocytes Absolute: 0.8 10*3/uL (ref 0.1–1.0)
Monocytes Relative: 8 %
Neutro Abs: 7.5 10*3/uL (ref 1.7–7.7)
Neutrophils Relative %: 74 %
Platelets: 177 10*3/uL (ref 150–400)
RBC: 4.67 MIL/uL (ref 4.22–5.81)
RDW: 23.2 % — ABNORMAL HIGH (ref 11.5–15.5)
WBC: 10.1 10*3/uL (ref 4.0–10.5)
nRBC: 0 % (ref 0.0–0.2)

## 2020-03-28 MED ORDER — DEXTROSE 5 % IV SOLN
Freq: Once | INTRAVENOUS | Status: AC
  Filled 2020-03-28: qty 250

## 2020-03-28 MED ORDER — PALONOSETRON HCL INJECTION 0.25 MG/5ML
0.2500 mg | Freq: Once | INTRAVENOUS | Status: AC
  Administered 2020-03-28: 0.25 mg via INTRAVENOUS
  Filled 2020-03-28: qty 5

## 2020-03-28 MED ORDER — SODIUM CHLORIDE 0.9 % IV SOLN
10.0000 mg | Freq: Once | INTRAVENOUS | Status: DC
  Filled 2020-03-28: qty 1

## 2020-03-28 MED ORDER — OXALIPLATIN CHEMO INJECTION 100 MG/20ML
82.0000 mg/m2 | Freq: Once | INTRAVENOUS | Status: AC
  Administered 2020-03-28: 200 mg via INTRAVENOUS
  Filled 2020-03-28: qty 40

## 2020-03-28 MED ORDER — LEUCOVORIN CALCIUM INJECTION 350 MG
1000.0000 mg | Freq: Once | INTRAVENOUS | Status: DC
  Filled 2020-03-28: qty 50

## 2020-03-28 MED ORDER — SODIUM CHLORIDE 0.9 % IV SOLN
10.0000 mg | Freq: Once | INTRAVENOUS | Status: AC
  Administered 2020-03-28: 10 mg via INTRAVENOUS
  Filled 2020-03-28: qty 1

## 2020-03-28 MED ORDER — OXALIPLATIN CHEMO INJECTION 100 MG/20ML
200.0000 mg | Freq: Once | INTRAVENOUS | Status: DC
  Filled 2020-03-28: qty 40

## 2020-03-28 MED ORDER — SODIUM CHLORIDE 0.9% FLUSH
10.0000 mL | INTRAVENOUS | Status: DC | PRN
  Administered 2020-03-28: 10 mL via INTRAVENOUS
  Filled 2020-03-28: qty 10

## 2020-03-28 MED ORDER — SODIUM CHLORIDE 0.9 % IV SOLN
2400.0000 mg/m2 | INTRAVENOUS | Status: DC
  Filled 2020-03-28: qty 121

## 2020-03-28 MED ORDER — SODIUM CHLORIDE 0.9 % IV SOLN
2479.0000 mg/m2 | INTRAVENOUS | Status: DC
  Administered 2020-03-28: 6050 mg via INTRAVENOUS
  Filled 2020-03-28: qty 121

## 2020-03-28 MED ORDER — PALONOSETRON HCL INJECTION 0.25 MG/5ML: 0.2500 mg | Freq: Once | INTRAVENOUS | Status: DC

## 2020-03-28 MED ORDER — LEUCOVORIN CALCIUM INJECTION 350 MG
410.0000 mg/m2 | Freq: Once | INTRAVENOUS | Status: AC
  Administered 2020-03-28: 1000 mg via INTRAVENOUS
  Filled 2020-03-28: qty 50

## 2020-03-28 NOTE — Assessment & Plan Note (Addendum)
#  Rectal adenocarcinoma- moderate differentiated; stage III [multiple pelvic lymph nodes]; question borderline 9 mm retroperitoneal neuropathy. PET-bulky rectal mass; multiple lymph nodes noted; no uptake in the retroperitoneal lymph nodes; MRI rectum- T3N2M0;   Pretreatment CEA 94. ON TNT [total neoadjuvant therapy]; currently on FOLFOX chemotherapy. CEA- pre-treatment- ~100; CEA- 4.2; STABLE.   # Proceed with cycle #7 of FOLFOX chemotherapy; Labs today reviewed;  acceptable for treatment.  Home Growth factor support.  Will talk to RT- re: moving RT dates out to October. With plan 5FU M-F x5 weeks with RT.   # Rising alkaline phosphatase-today -180s- likely from chemotherapy; CEA improving  # PN-cold sensistivity-G-1- STABLE.   # Iron deficient anemia/chronic GI bleed-Hb13.2; HOLD IV iron today  # Peri-rectal pain-STABLE;  Viicodin prn.    DISPOSITION:  # chemo today; pump off in 2 days;HOLD Venofer ;   #Follow-up in 2 weeks; MD; labs CBC CMP; CEA;; FOLFOX   D-3- pump off-Dr.B

## 2020-03-28 NOTE — Progress Notes (Signed)
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 CEA- 36. CT C/A/P- Large mass involving the rectum is identified compatible with primary rectal carcinoma. This has a large, 6.7 cm exophytic component in the left pelvis. Multiple perirectal lymph nodes are identified compatible with metastatic adenopathy; Borderline enlarged aortocaval node measures 0.9 cm; PET-bulky rectal mass; multiple lymph nodes noted; no uptake in the retroperitoneal lymph nodes; MRI rectum- T3N2M0;   Pretreatment CEA 94; TNT  # June 1st 2021- FOLFOX  # SURVIVORSHIP:   # GENETICS:   DIAGNOSIS: RECTAL CA  STAGE:   III     ;  GOALS: cure  CURRENT/MOST RECENT THERAPY : FOLFOX [C]   Rectal cancer ([place])  12/20/2019 Initial Diagnosis   Rectal cancer ([place])   01/04/2020 -  Chemotherapy   The patient had dexamethasone (DECADRON) 4 MG tablet, 8 mg, Oral, Daily, 1 of 1 cycle, Start date: --, End date: -- palonosetron (ALOXI) injection 0.25 mg, 0.25 mg, Intravenous,  Once, 7 of 8 cycles Administration: 0.25 mg (01/04/2020), 0.25 mg (01/17/2020), 0.25 mg (01/31/2020), 0.25 mg (02/14/2020), 0.25 mg (02/28/2020), 0.25 mg (03/13/2020) pegfilgrastim (NEULASTA ONPRO KIT) injection 6 mg, 6 mg, Subcutaneous, Once, 2 of 2 cycles Administration: 6 mg (02/02/2020) leucovorin 1,000 mg in dextrose 5 % 250 mL infusion, 1,008 mg, Intravenous,  Once, 7 of 8 cycles Administration: 1,000 mg (01/04/2020), 1,000 mg (01/17/2020), 1,000 mg  (01/31/2020), 1,000 mg (02/14/2020), 1,000 mg (02/28/2020), 1,000 mg (03/13/2020) oxaliplatin (ELOXATIN) 200 mg in dextrose 5 % 500 mL chemo infusion, 215 mg, Intravenous,  Once, 7 of 8 cycles Administration: 200 mg (01/04/2020), 200 mg (01/17/2020), 200 mg (01/31/2020), 200 mg (02/14/2020), 200 mg (02/28/2020), 200 mg (03/13/2020) fluorouracil (ADRUCIL) chemo injection 1,000 mg, 400 mg/m2 = 1,000 mg, Intravenous,  Once, 2 of 2 cycles Administration: 1,000 mg (01/04/2020), 1,000 mg (01/17/2020) fluorouracil (ADRUCIL) 6,050 mg in sodium chloride 0.9 % 129 mL chemo infusion, 2,400 mg/m2 = 6,050 mg, Intravenous, 1 Day/Dose, 7 of 8 cycles Administration: 6,050 mg (01/04/2020), 6,050 mg (01/17/2020), 6,050 mg (01/31/2020), 6,050 mg (02/14/2020), 6,050 mg (02/28/2020), 6,050 mg (03/13/2020)  for chemotherapy treatment.       HISTORY OF PRESENTING ILLNESS:  Stephen Vasquez 59 y.o.  male  With stage III rectal cancer currently on TNT-FOLFOX chemotherapy is here for follow-up.  In the interim patient was evaluated by radiation oncology; plan to start radiation after finishing 8 cycles of treatment.  Denies any fevers or chills.  Denies any headaches.  No blood in stools or black or stools.  Continues to have cold sensitivity in the extremities.   Review of Systems  Constitutional: Negative for chills, diaphoresis, fever, malaise/fatigue and weight loss.  HENT: Negative for nosebleeds and sore throat.   Eyes: Negative for double vision.  Respiratory: Negative for cough, hemoptysis, sputum production, shortness of breath and wheezing.   Cardiovascular: Negative for chest pain, palpitations, orthopnea and leg swelling.  Gastrointestinal: Negative for abdominal pain, constipation, diarrhea, heartburn, melena, nausea and vomiting.  Genitourinary: Negative for dysuria, frequency and urgency.  Musculoskeletal: Positive for joint pain. Negative for back pain.  Skin: Negative.  Negative for itching and rash.  Neurological:  Negative for dizziness, tingling, focal weakness, weakness and headaches.  Endo/Heme/Allergies: Does not bruise/bleed easily.  Psychiatric/Behavioral: Negative for depression. The patient is not nervous/anxious and does not have insomnia.      MEDICAL HISTORY:  Past Medical History:  Diagnosis Date  . Allergic rhinitis   . Heart murmur   . Rectal cancer [facility])     SURGICAL HISTORY: Past Surgical History:  Procedure Laterality Date  . COLONOSCOPY WITH PROPOFOL N/A 12/15/2019   Procedure: COLONOSCOPY WITH PROPOFOL;  Surgeon: [name], MD;  Location: [facility] ENDOSCOPY;  Service: Gastroenterology;  Laterality: N/A;  . PORTACATH PLACEMENT Left 12/29/2019   Procedure: INSERTION PORT-A-CATH;  Surgeon: Robert Bellow, MD;  Location: [facility] ORS;  Service: General;  Laterality: Left;    SOCIAL HISTORY: Social History   Socioeconomic History  . Marital status: Married    Spouse name: Not on file  . Number of children: Not on file  . Years of education: Not on file  . Highest education level: Not on file  Occupational History  . Not on file  Tobacco Use  . Smoking status: Former Research scientist (life sciences)  . Smokeless tobacco: Never Used  Vaping Use  . Vaping Use: Never used  Substance and Sexual Activity  . Alcohol use: Not Currently  . Drug use: Never  . Sexual activity: Yes  Other Topics Concern  . 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 2 children- in boy and girls in late 30s.    Social Determinants of Health   Financial Resource Strain:   . Difficulty of Paying Living Expenses: Not on file  Food Insecurity:   . Worried About Charity fundraiser in the Last Year: Not on file  . Ran Out of Food in the Last Year: Not on file  Transportation Needs:   . Lack of Transportation (Medical): Not on file  . Lack of Transportation (Non-Medical): Not on file  Physical Activity:   . Days of Exercise per Week: Not on file   . Minutes of Exercise per Session: Not on file  Stress:   . Feeling of Stress : Not on file  Social Connections:   . Frequency of Communication with Friends and Family: Not on file  . Frequency of Social Gatherings with Friends and Family: Not on file  . Attends Religious Services: Not on file  . Active Member of Clubs or Organizations: Not on file  . Attends Archivist Meetings: Not on file  . Marital Status: Not on file  Intimate Partner Violence:   . Fear of Current or Ex-Partner: Not on file  . Emotionally Abused: Not on file  . Physically Abused: Not on file  . Sexually Abused: Not on file    FAMILY HISTORY: Family History  Problem Relation Age of Onset  . Hypertension Mother   . Hypertension Father   . Stomach cancer Father        in 34s- survived.   Marland Kitchen Heart disease Brother     ALLERGIES:  has No Known Allergies.  MEDICATIONS:  Current Outpatient Medications  Medication Sig Dispense Refill  . azelastine (ASTELIN) 0.1 % nasal spray Place 2 sprays into both nostrils 2 (two) times daily. Use in each nostril as directed 30 mL 12  . lidocaine-prilocaine (EMLA) cream Apply 1 application topically as needed. (Patient taking differently: Apply 1 application topically as  needed (port access). ) 30 g 0  . montelukast (SINGULAIR) 10 MG tablet Take 1 tablet (10 mg total) by mouth at bedtime. 30 tablet 3  . naproxen sodium (ALEVE) 220 MG tablet Take 220 mg by mouth 2 (two) times daily as needed (pain).    . ondansetron (ZOFRAN) 8 MG tablet One pill every 8 hours as needed for nausea/vomitting. 40 tablet 1  . oxymetazoline (AFRIN) 0.05 % nasal spray Place 1 spray into both nostrils 2 (two) times daily as needed for congestion.    . prochlorperazine (COMPAZINE) 10 MG tablet Take 1 tablet (10 mg total) by mouth every 6 (six) hours as needed for nausea or vomiting. 40 tablet 1  . dicyclomine (BENTYL) 10 MG capsule Take 1 capsule (10 mg total) by mouth 4 (four) times daily -   before meals and at bedtime. (Patient not taking: Reported on 03/01/2020) 120 capsule 1  . HYDROcodone-acetaminophen (NORCO/VICODIN) 5-325 MG tablet Take 1 tablet by mouth every 8 (eight) hours as needed for moderate pain. (Patient not taking: Reported on 03/28/2020) 45 tablet 0   No current facility-administered medications for this visit.   Facility-Administered Medications Ordered in Other Visits  Medication Dose Route Frequency Provider Last Rate Last Admin  . fluorouracil (ADRUCIL) 6,050 mg in sodium chloride 0.9 % 129 mL chemo infusion  2,479 mg/m2 Intravenous 1 day or 1 dose Cammie Sickle, MD   6,050 mg at 03/28/20 1240  . sodium chloride flush (NS) 0.9 % injection 10 mL  10 mL Intravenous PRN Cammie Sickle, MD   10 mL at 03/28/20 0813      .  PHYSICAL EXAMINATION: ECOG PERFORMANCE STATUS: 1 - Symptomatic but completely ambulatory  Vitals:   03/28/20 0830  BP: 114/78  Pulse: 73  Resp: 20  Temp: (!) 96.4 F (35.8 C)   Filed Weights   03/28/20 0830  Weight: 254 lb 9.6 oz (115.5 kg)    Physical Exam Constitutional:      Comments: Accompanied by his wife.  HENT:     Head: Normocephalic and atraumatic.     Mouth/Throat:     Pharynx: No oropharyngeal exudate.  Eyes:     Pupils: Pupils are equal, round, and reactive to light.  Cardiovascular:     Rate and Rhythm: Normal rate and regular rhythm.  Pulmonary:     Effort: Pulmonary effort is normal. No respiratory distress.     Breath sounds: Normal breath sounds. No wheezing.  Abdominal:     General: Bowel sounds are normal. There is no distension.     Palpations: Abdomen is soft. There is no mass.     Tenderness: There is no abdominal tenderness. There is no guarding or rebound.  Musculoskeletal:        General: No tenderness. Normal range of motion.     Cervical back: Normal range of motion and neck supple.  Skin:    General: Skin is warm.  Neurological:     Mental Status: He is alert and oriented to  person, place, and time.  Psychiatric:        Mood and Affect: Affect normal.      LABORATORY DATA:  I have reviewed the data as listed Lab Results  Component Value Date   WBC 10.1 03/28/2020   HGB 13.0 03/28/2020   HCT 40.1 03/28/2020   MCV 85.9 03/28/2020   PLT 177 03/28/2020   Recent Labs    02/28/20 0815 03/13/20 0805 03/28/20 0813  NA 140  141 137  K 3.9 3.9 4.0  CL 106 107 104  CO2 _0 GLUCOSE 105* 102* 100*  BUN 19 24* 19  CREATININE 1.22 1.01 1.00  CALCIUM 9.2 9.1 9.1  GFRNONAA >60 >60 >60  GFRAA >60 >60 >60  PROT 7.8 8.1 8.2*  ALBUMIN 3.8 3.9 3.8  AST 23 31 35  ALT 23 31 33  ALKPHOS 149* 160* 182*  BILITOT 0.6 0.6 0.5    RADIOGRAPHIC STUDIES: I have personally reviewed the radiological images as listed and agreed with the findings in the report. No results found.  ASSESSMENT & PLAN:   Rectal cancer ([place]) #Rectal adenocarcinoma- moderate differentiated; stage III [multiple pelvic lymph nodes]; question borderline 9 mm retroperitoneal neuropathy. PET-bulky rectal mass; multiple lymph nodes noted; no uptake in the retroperitoneal lymph nodes; MRI rectum- T3N2M0;   Pretreatment CEA 94. ON TNT [total neoadjuvant therapy]; currently on FOLFOX chemotherapy. CEA- pre-treatment- ~100; CEA- 4.2; STABLE.   # Proceed with cycle #7 of FOLFOX chemotherapy; Labs today reviewed;  acceptable for treatment.  Home Growth factor support.  Will talk to RT- re: moving RT dates out to October. With plan 5FU M-F x5 weeks with RT.   # Rising alkaline phosphatase-today -180s- likely from chemotherapy; CEA improving  # PN-cold sensistivity-G-1- STABLE.   # Iron deficient anemia/chronic GI bleed-Hb13.2; HOLD IV iron today  # Peri-rectal pain-STABLE;  Viicodin prn.    DISPOSITION:  # chemo today; pump off in 2 days;HOLD Venofer ;   #Follow-up in 2 weeks; MD; labs CBC CMP; CEA;; FOLFOX   D-3- pump off-Dr.B   All questions were answered. The patient knows to call the  clinic with any problems, questions or concerns.    Cammie Sickle, MD 03/28/2020 1:12 PM

## 2020-03-29 LAB — CEA: CEA: 3.1 ng/mL (ref 0.0–4.7)

## 2020-03-30 ENCOUNTER — Other Ambulatory Visit: Payer: Self-pay

## 2020-03-30 ENCOUNTER — Inpatient Hospital Stay: Payer: 59

## 2020-03-30 DIAGNOSIS — C20 Malignant neoplasm of rectum: Secondary | ICD-10-CM

## 2020-03-30 DIAGNOSIS — Z5111 Encounter for antineoplastic chemotherapy: Secondary | ICD-10-CM | POA: Diagnosis not present

## 2020-03-30 MED ORDER — HEPARIN SOD (PORK) LOCK FLUSH 100 UNIT/ML IV SOLN
500.0000 [IU] | Freq: Once | INTRAVENOUS | Status: AC | PRN
  Administered 2020-03-30: 500 [IU]
  Filled 2020-03-30: qty 5

## 2020-03-30 MED ORDER — SODIUM CHLORIDE 0.9% FLUSH
10.0000 mL | INTRAVENOUS | Status: DC | PRN
  Administered 2020-03-30: 10 mL
  Filled 2020-03-30: qty 10

## 2020-03-30 MED ORDER — HEPARIN SOD (PORK) LOCK FLUSH 100 UNIT/ML IV SOLN
INTRAVENOUS | Status: AC
  Filled 2020-03-30: qty 5

## 2020-04-04 ENCOUNTER — Ambulatory Visit: Payer: 59

## 2020-04-05 ENCOUNTER — Ambulatory Visit: Payer: 59

## 2020-04-06 ENCOUNTER — Ambulatory Visit: Payer: 59

## 2020-04-07 ENCOUNTER — Ambulatory Visit: Payer: 59

## 2020-04-07 ENCOUNTER — Encounter: Payer: Self-pay | Admitting: Internal Medicine

## 2020-04-07 NOTE — Progress Notes (Signed)
Patient denies any pain or concerns at this time. Just wanted to see if he could get appointments scheduled back on mondays going forward.

## 2020-04-11 ENCOUNTER — Ambulatory Visit: Payer: 59

## 2020-04-11 ENCOUNTER — Inpatient Hospital Stay (HOSPITAL_BASED_OUTPATIENT_CLINIC_OR_DEPARTMENT_OTHER): Payer: 59 | Admitting: Internal Medicine

## 2020-04-11 ENCOUNTER — Other Ambulatory Visit: Payer: Self-pay

## 2020-04-11 ENCOUNTER — Inpatient Hospital Stay: Payer: 59

## 2020-04-11 ENCOUNTER — Inpatient Hospital Stay: Payer: 59 | Attending: Internal Medicine

## 2020-04-11 DIAGNOSIS — C20 Malignant neoplasm of rectum: Secondary | ICD-10-CM | POA: Insufficient documentation

## 2020-04-11 DIAGNOSIS — Z5111 Encounter for antineoplastic chemotherapy: Secondary | ICD-10-CM | POA: Insufficient documentation

## 2020-04-11 LAB — CBC WITH DIFFERENTIAL/PLATELET
Abs Immature Granulocytes: 0.08 10*3/uL — ABNORMAL HIGH (ref 0.00–0.07)
Basophils Absolute: 0.1 10*3/uL (ref 0.0–0.1)
Basophils Relative: 1 %
Eosinophils Absolute: 0.1 10*3/uL (ref 0.0–0.5)
Eosinophils Relative: 1 %
HCT: 39.2 % (ref 39.0–52.0)
Hemoglobin: 12.6 g/dL — ABNORMAL LOW (ref 13.0–17.0)
Immature Granulocytes: 1 %
Lymphocytes Relative: 16 %
Lymphs Abs: 1.5 10*3/uL (ref 0.7–4.0)
MCH: 28.1 pg (ref 26.0–34.0)
MCHC: 32.1 g/dL (ref 30.0–36.0)
MCV: 87.5 fL (ref 80.0–100.0)
Monocytes Absolute: 0.8 10*3/uL (ref 0.1–1.0)
Monocytes Relative: 9 %
Neutro Abs: 6.5 10*3/uL (ref 1.7–7.7)
Neutrophils Relative %: 72 %
Platelets: 160 10*3/uL (ref 150–400)
RBC: 4.48 MIL/uL (ref 4.22–5.81)
RDW: 20.9 % — ABNORMAL HIGH (ref 11.5–15.5)
WBC: 9 10*3/uL (ref 4.0–10.5)
nRBC: 0 % (ref 0.0–0.2)

## 2020-04-11 LAB — COMPREHENSIVE METABOLIC PANEL
ALT: 29 U/L (ref 0–44)
AST: 34 U/L (ref 15–41)
Albumin: 3.7 g/dL (ref 3.5–5.0)
Alkaline Phosphatase: 191 U/L — ABNORMAL HIGH (ref 38–126)
Anion gap: 7 (ref 5–15)
BUN: 20 mg/dL (ref 6–20)
CO2: 25 mmol/L (ref 22–32)
Calcium: 8.8 mg/dL — ABNORMAL LOW (ref 8.9–10.3)
Chloride: 106 mmol/L (ref 98–111)
Creatinine, Ser: 1.06 mg/dL (ref 0.61–1.24)
GFR calc Af Amer: >60 mL/min (ref >60)
GFR calc non Af Amer: >60 mL/min (ref >60)
Glucose, Bld: 107 mg/dL — ABNORMAL HIGH (ref 70–99)
Potassium: 3.9 mmol/L (ref 3.5–5.1)
Sodium: 138 mmol/L (ref 135–145)
Total Bilirubin: 0.6 mg/dL (ref 0.3–1.2)
Total Protein: 7.7 g/dL (ref 6.5–8.1)

## 2020-04-11 MED ORDER — SODIUM CHLORIDE 0.9 % IV SOLN
10.0000 mg | Freq: Once | INTRAVENOUS | Status: AC
  Administered 2020-04-11: 10 mg via INTRAVENOUS
  Filled 2020-04-11: qty 10

## 2020-04-11 MED ORDER — LEUCOVORIN CALCIUM INJECTION 350 MG
1000.0000 mg | Freq: Once | INTRAVENOUS | Status: AC
  Administered 2020-04-11: 1000 mg via INTRAVENOUS
  Filled 2020-04-11: qty 50

## 2020-04-11 MED ORDER — OXALIPLATIN CHEMO INJECTION 100 MG/20ML
200.0000 mg | Freq: Once | INTRAVENOUS | Status: AC
  Administered 2020-04-11: 200 mg via INTRAVENOUS
  Filled 2020-04-11: qty 40

## 2020-04-11 MED ORDER — SODIUM CHLORIDE 0.9% FLUSH
10.0000 mL | INTRAVENOUS | Status: DC | PRN
  Administered 2020-04-11: 10 mL via INTRAVENOUS
  Filled 2020-04-11: qty 10

## 2020-04-11 MED ORDER — SODIUM CHLORIDE 0.9 % IV SOLN
2400.0000 mg/m2 | INTRAVENOUS | Status: DC
  Administered 2020-04-11: 6050 mg via INTRAVENOUS
  Filled 2020-04-11: qty 121

## 2020-04-11 MED ORDER — PALONOSETRON HCL INJECTION 0.25 MG/5ML
0.2500 mg | Freq: Once | INTRAVENOUS | Status: AC
  Administered 2020-04-11: 0.25 mg via INTRAVENOUS
  Filled 2020-04-11: qty 5

## 2020-04-11 MED ORDER — DEXTROSE 5 % IV SOLN
Freq: Once | INTRAVENOUS | Status: AC
  Filled 2020-04-11: qty 250

## 2020-04-11 NOTE — Progress Notes (Signed)
[place] CONSULT NOTE  Patient Care Team: Volney American, PA-C as PCP - General (Family Medicine) Clent Jacks, RN as Oncology Nurse Navigator [name], MD as Consulting Physician (Gastroenterology) Cammie Sickle, MD as Consulting Physician (Internal Medicine)  CHIEF COMPLAINTS/PURPOSE OF CONSULTATION: Rectal cancer  #  Oncology History Overview Note  # MAY 2021-rectal adenocarcinoma ; moderately differentiated; [Dr.Anna]; colonoscopy-partially obstructing circumferential rectal mass-starting at anal verge to proximal. CEA- 69. CT C/A/P- Large mass involving the rectum is identified compatible with primary rectal carcinoma. This has a large, 6.7 cm exophytic component in the left pelvis. Multiple perirectal lymph nodes are identified compatible with metastatic adenopathy; Borderline enlarged aortocaval node measures 0.9 cm; PET-bulky rectal mass; multiple lymph nodes noted; no uptake in the retroperitoneal lymph nodes; MRI rectum- T3N2M0;   Pretreatment CEA 94; TNT  # June 1st 2021- FOLFOX  # SURVIVORSHIP:   # GENETICS:   DIAGNOSIS: RECTAL CA  STAGE:   III     ;  GOALS: cure  CURRENT/MOST RECENT THERAPY : FOLFOX [C]   Rectal cancer ([place])  12/20/2019 Initial Diagnosis   Rectal cancer ([place])   01/04/2020 - 04/11/2020 Chemotherapy   The patient had dexamethasone (DECADRON) 4 MG tablet, 8 mg, Oral, Daily, 1 of 1 cycle, Start date: --, End date: -- palonosetron (ALOXI) injection 0.25 mg, 0.25 mg, Intravenous,  Once, 8 of 8 cycles Administration: 0.25 mg (01/04/2020), 0.25 mg (01/17/2020), 0.25 mg (01/31/2020), 0.25 mg (02/14/2020), 0.25 mg (02/28/2020), 0.25 mg (03/13/2020), 0.25 mg (04/11/2020) pegfilgrastim (NEULASTA ONPRO KIT) injection 6 mg, 6 mg, Subcutaneous, Once, 2 of 2 cycles Administration: 6 mg (02/02/2020) leucovorin 1,000 mg in dextrose 5 % 250 mL infusion, 1,008 mg, Intravenous,  Once, 8 of 8 cycles Administration: 1,000 mg (01/04/2020), 1,000 mg  (01/17/2020), 1,000 mg (01/31/2020), 1,000 mg (02/14/2020), 1,000 mg (02/28/2020), 1,000 mg (03/13/2020), 1,000 mg (04/11/2020) oxaliplatin (ELOXATIN) 200 mg in dextrose 5 % 500 mL chemo infusion, 215 mg, Intravenous,  Once, 8 of 8 cycles Administration: 200 mg (01/04/2020), 200 mg (01/17/2020), 200 mg (01/31/2020), 200 mg (02/14/2020), 200 mg (02/28/2020), 200 mg (03/13/2020), 200 mg (04/11/2020) fluorouracil (ADRUCIL) chemo injection 1,000 mg, 400 mg/m2 = 1,000 mg, Intravenous,  Once, 2 of 2 cycles Administration: 1,000 mg (01/04/2020), 1,000 mg (01/17/2020) fluorouracil (ADRUCIL) 6,050 mg in sodium chloride 0.9 % 129 mL chemo infusion, 2,400 mg/m2 = 6,050 mg, Intravenous, 1 Day/Dose, 8 of 8 cycles Administration: 6,050 mg (01/04/2020), 6,050 mg (01/17/2020), 6,050 mg (01/31/2020), 6,050 mg (02/14/2020), 6,050 mg (02/28/2020), 6,050 mg (03/13/2020), 6,050 mg (04/11/2020)  for chemotherapy treatment.    04/24/2020 -  Chemotherapy   The patient had fluorouracil (ADRUCIL) 2,800 mg in sodium chloride 0.9 % 94 mL chemo infusion, 225 mg/m2/day = 2,800 mg, Intravenous, 5D (120 hours), 0 of 5 cycles  for chemotherapy treatment.       HISTORY OF PRESENTING ILLNESS:  Stephen Vasquez 59 y.o.  male  With stage III rectal cancer currently on TNT-FOLFOX chemotherapy is here for follow-up.  Patient denies any nausea vomiting. Denies any abdominal pain.   Cold sensitivity in the mouth; and in the fingertips. Mild tingling and numbness in extremities. Otherwise no blood in stools or black-colored stools. No headaches. No fevers or chills.   Review of Systems  Constitutional: Negative for chills, diaphoresis, fever, malaise/fatigue and weight loss.  HENT: Negative for nosebleeds and sore throat.   Eyes: Negative for double vision.  Respiratory: Negative for cough, hemoptysis, sputum production, shortness of breath and  wheezing.   Cardiovascular: Negative for chest pain, palpitations, orthopnea and leg swelling.  Gastrointestinal:  Negative for abdominal pain, constipation, diarrhea, heartburn, melena, nausea and vomiting.  Genitourinary: Negative for dysuria, frequency and urgency.  Musculoskeletal: Positive for joint pain. Negative for back pain.  Skin: Negative.  Negative for itching and rash.  Neurological: Positive for tingling. Negative for dizziness, focal weakness, weakness and headaches.  Endo/Heme/Allergies: Does not bruise/bleed easily.  Psychiatric/Behavioral: Negative for depression. The patient is not nervous/anxious and does not have insomnia.      MEDICAL HISTORY:  Past Medical History:  Diagnosis Date  . Allergic rhinitis   . Heart murmur   . Rectal cancer [facility])     SURGICAL HISTORY: Past Surgical History:  Procedure Laterality Date  . COLONOSCOPY WITH PROPOFOL N/A 12/15/2019   Procedure: COLONOSCOPY WITH PROPOFOL;  Surgeon: [name], MD;  Location: [facility] ENDOSCOPY;  Service: Gastroenterology;  Laterality: N/A;  . PORTACATH PLACEMENT Left 12/29/2019   Procedure: INSERTION PORT-A-CATH;  Surgeon: Robert Bellow, MD;  Location: [facility] ORS;  Service: General;  Laterality: Left;    SOCIAL HISTORY: Social History   Socioeconomic History  . Marital status: Married    Spouse name: Not on file  . Number of children: Not on file  . Years of education: Not on file  . Highest education level: Not on file  Occupational History  . Not on file  Tobacco Use  . Smoking status: Former Research scientist (life sciences)  . Smokeless tobacco: Never Used  Vaping Use  . Vaping Use: Never used  Substance and Sexual Activity  . Alcohol use: Not Currently  . Drug use: Never  . Sexual activity: Yes  Other Topics Concern  . 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 2 children- in boy and girls in late 63s.    Social Determinants of Health   Financial Resource Strain:   . Difficulty of Paying Living Expenses: Not on file  Food Insecurity:   .  Worried About Charity fundraiser in the Last Year: Not on file  . Ran Out of Food in the Last Year: Not on file  Transportation Needs:   . Lack of Transportation (Medical): Not on file  . Lack of Transportation (Non-Medical): Not on file  Physical Activity:   . Days of Exercise per Week: Not on file  . Minutes of Exercise per Session: Not on file  Stress:   . Feeling of Stress : Not on file  Social Connections:   . Frequency of Communication with Friends and Family: Not on file  . Frequency of Social Gatherings with Friends and Family: Not on file  . Attends Religious Services: Not on file  . Active Member of Clubs or Organizations: Not on file  . Attends Archivist Meetings: Not on file  . Marital Status: Not on file  Intimate Partner Violence:   . Fear of Current or Ex-Partner: Not on file  . Emotionally Abused: Not on file  . Physically Abused: Not on file  . Sexually Abused: Not on file    FAMILY HISTORY: Family History  Problem Relation Age of Onset  . Hypertension Mother   . Hypertension Father   . Stomach cancer Father        in 37s- survived.   Marland Kitchen Heart disease Brother     ALLERGIES:  has No Known Allergies.  MEDICATIONS:  Current Outpatient Medications  Medication Sig Dispense Refill  . azelastine (ASTELIN) 0.1 % nasal spray Place 2 sprays into both nostrils 2 (two) times daily. Use in each nostril as directed 30 mL 12  . lidocaine-prilocaine (EMLA) cream Apply 1 application topically as needed. (Patient taking differently: Apply 1 application topically as needed (port access). ) 30 g 0  . montelukast (SINGULAIR) 10 MG tablet Take 1 tablet (10 mg total) by mouth at bedtime. 30 tablet 3  . naproxen sodium (ALEVE) 220 MG tablet Take 220 mg by mouth 2 (two) times daily as needed (pain).    Marland Kitchen oxymetazoline (AFRIN) 0.05 % nasal spray Place 1 spray into both nostrils 2 (two) times daily as needed for congestion.    . dicyclomine (BENTYL) 10 MG capsule Take 1  capsule (10 mg total) by mouth 4 (four) times daily -  before meals and at bedtime. (Patient not taking: Reported on 03/01/2020) 120 capsule 1  . HYDROcodone-acetaminophen (NORCO/VICODIN) 5-325 MG tablet Take 1 tablet by mouth every 8 (eight) hours as needed for moderate pain. (Patient not taking: Reported on 03/28/2020) 45 tablet 0  . ondansetron (ZOFRAN) 8 MG tablet One pill every 8 hours as needed for nausea/vomitting. (Patient not taking: Reported on 04/07/2020) 40 tablet 1  . prochlorperazine (COMPAZINE) 10 MG tablet Take 1 tablet (10 mg total) by mouth every 6 (six) hours as needed for nausea or vomiting. (Patient not taking: Reported on 04/07/2020) 40 tablet 1   No current facility-administered medications for this visit.   Facility-Administered Medications Ordered in Other Visits  Medication Dose Route Frequency Provider Last Rate Last Admin  . sodium chloride flush (NS) 0.9 % injection 10 mL  10 mL Intravenous PRN Cammie Sickle, MD   10 mL at 04/11/20 0830      .  PHYSICAL EXAMINATION: ECOG PERFORMANCE STATUS: 1 - Symptomatic but completely ambulatory  Vitals:   04/11/20 0836  BP: 115/70  Pulse: 74  Resp: 16  Temp: (!) 96.6 F (35.9 C)  SpO2: 100%   Filed Weights   04/11/20 0836  Weight: 263 lb (119.3 kg)    Physical Exam Constitutional:      Comments: He is alone.  HENT:     Head: Normocephalic and atraumatic.     Mouth/Throat:     Pharynx: No oropharyngeal exudate.  Eyes:     Pupils: Pupils are equal, round, and reactive to light.  Cardiovascular:     Rate and Rhythm: Normal rate and regular rhythm.  Pulmonary:     Effort: Pulmonary effort is normal. No respiratory distress.     Breath sounds: Normal breath sounds. No wheezing.  Abdominal:     General: Bowel sounds are normal. There is no distension.     Palpations: Abdomen is soft. There is no mass.     Tenderness: There is no abdominal tenderness. There is no guarding or rebound.  Musculoskeletal:         General: No tenderness. Normal range of motion.     Cervical back: Normal range of motion and neck supple.  Skin:    General: Skin is warm.  Neurological:     Mental Status: He is alert and oriented to person, place, and time.  Psychiatric:        Mood and Affect: Affect normal.      LABORATORY DATA:  I have reviewed the data as listed Lab Results  Component Value Date   WBC 9.0 04/11/2020   HGB 12.6 (L) 04/11/2020   HCT  39.2 04/11/2020   MCV 87.5 04/11/2020   PLT 160 04/11/2020   Recent Labs    03/13/20 0805 03/28/20 0813 04/11/20 0822  NA 141 137 138  K 3.9 4.0 3.9  CL 107 104 106  CO2 24 26 25   GLUCOSE 102* 100* 107*  BUN 24* 19 20  CREATININE 1.01 1.00 1.06  CALCIUM 9.1 9.1 8.8*  GFRNONAA >60 >60 >60  GFRAA >60 >60 >60  PROT 8.1 8.2* 7.7  ALBUMIN 3.9 3.8 3.7  AST 31 35 34  ALT 31 33 29  ALKPHOS 160* 182* 191*  BILITOT 0.6 0.5 0.6    RADIOGRAPHIC STUDIES: I have personally reviewed the radiological images as listed and agreed with the findings in the report. No results found.  ASSESSMENT & PLAN:   Rectal cancer ([place]) #Rectal adenocarcinoma- moderate differentiated; stage III [multiple pelvic lymph nodes]; question borderline 9 mm retroperitoneal neuropathy. PET-bulky rectal mass; multiple lymph nodes noted; no uptake in the retroperitoneal lymph nodes; MRI rectum- T3N2M0;   Pretreatment CEA 94. ON TNT [total neoadjuvant therapy]; currently on FOLFOX chemotherapy. CEA- pre-treatment- ~100; CEA- 4.2; STABLE.   # Proceed with cycle #8 of FOLFOX chemotherapy; Labs today reviewed;  acceptable for treatment.  Home Growth factor support.  RT- planned to start on Sep 27th.  With plan CIV 5-FU M-F x 5 weeks with RT.   # Rising alkaline phosphatase-today -190s- likely from chemotherapy; CEA improving at 3.0.   # PN-cold sensistivity-G-1- STABLE.    # Iron deficient anemia/chronic GI bleed-Hb13.2; HOLD IV iron today  # Peri-rectal pain- STABLE.  Viicodin  prn.   DISPOSITION:  # chemo today; pump off in 2 days; #Follow-up on 9/20 - MD; labs CBC CMP; CEA; 5FU pump; DC pump on 9/24  # 9/27- labs- cbc/CMP-5FU pump; DC pump on oct 1st-Dr.B   All questions were answered. The patient knows to call the clinic with any problems, questions or concerns.    Cammie Sickle, MD 04/11/2020 1:05 PM

## 2020-04-11 NOTE — Progress Notes (Signed)
DISCONTINUE ON PATHWAY REGIMEN - Colorectal     A cycle is every 14 days:     Oxaliplatin      Leucovorin      Fluorouracil      Fluorouracil   **Always confirm dose/schedule in your pharmacy ordering system**  REASON: Continuation Of Treatment PRIOR TREATMENT: ROS56: mFOLFOX6 q14 Days x 4 Months TREATMENT RESPONSE: Partial Response (PR)  START ON PATHWAY REGIMEN - Colorectal     Administer Monday through Friday:     Capecitabine   **Always confirm dose/schedule in your pharmacy ordering system**  Patient Characteristics: Preoperative or Nonsurgical Candidate (Clinical Staging), Rectal, cT3 - cT4, cN0 or Any cT, cN+ Tumor Location: Rectal Therapeutic Status: Preoperative or Nonsurgical Candidate (Clinical Staging) AJCC T Category: cTX AJCC N Category: cNX AJCC M Category: cM0 AJCC 8 Stage Grouping: IIIB Intent of Therapy: Curative Intent, Discussed with Patient

## 2020-04-11 NOTE — Assessment & Plan Note (Addendum)
#  Rectal adenocarcinoma- moderate differentiated; stage III [multiple pelvic lymph nodes]; question borderline 9 mm retroperitoneal neuropathy. PET-bulky rectal mass; multiple lymph nodes noted; no uptake in the retroperitoneal lymph nodes; MRI rectum- T3N2M0;   Pretreatment CEA 94. ON TNT [total neoadjuvant therapy]; currently on FOLFOX chemotherapy. CEA- pre-treatment- ~100; CEA- 4.2; STABLE.   # Proceed with cycle #8 of FOLFOX chemotherapy; Labs today reviewed;  acceptable for treatment.  Home Growth factor support.  RT- planned to start on Sep 27th.  With plan CIV 5-FU M-F x 5 weeks with RT.   # Rising alkaline phosphatase-today -190s- likely from chemotherapy; CEA improving at 3.0.   # PN-cold sensistivity-G-1- STABLE.    # Iron deficient anemia/chronic GI bleed-Hb13.2; HOLD IV iron today  # Peri-rectal pain- STABLE.  Viicodin prn.   DISPOSITION:  # chemo today; pump off in 2 days; #Follow-up on 9/20 - MD; labs CBC CMP; CEA; 5FU pump; DC pump on 9/24  # 9/27- labs- cbc/CMP-5FU pump; DC pump on oct 1st-Dr.B

## 2020-04-12 ENCOUNTER — Ambulatory Visit: Payer: 59

## 2020-04-12 LAB — CEA: CEA: 2.2 ng/mL (ref 0.0–4.7)

## 2020-04-13 ENCOUNTER — Other Ambulatory Visit: Payer: Self-pay

## 2020-04-13 ENCOUNTER — Inpatient Hospital Stay: Payer: 59

## 2020-04-13 ENCOUNTER — Ambulatory Visit: Payer: 59

## 2020-04-13 DIAGNOSIS — Z5111 Encounter for antineoplastic chemotherapy: Secondary | ICD-10-CM | POA: Diagnosis not present

## 2020-04-13 DIAGNOSIS — Z95828 Presence of other vascular implants and grafts: Secondary | ICD-10-CM

## 2020-04-13 MED ORDER — SODIUM CHLORIDE 0.9% FLUSH
10.0000 mL | INTRAVENOUS | Status: DC | PRN
  Administered 2020-04-13: 10 mL via INTRAVENOUS
  Filled 2020-04-13: qty 10

## 2020-04-13 MED ORDER — HEPARIN SOD (PORK) LOCK FLUSH 100 UNIT/ML IV SOLN
INTRAVENOUS | Status: AC
  Filled 2020-04-13: qty 5

## 2020-04-13 MED ORDER — HEPARIN SOD (PORK) LOCK FLUSH 100 UNIT/ML IV SOLN
500.0000 [IU] | Freq: Once | INTRAVENOUS | Status: AC
  Administered 2020-04-13: 500 [IU] via INTRAVENOUS
  Filled 2020-04-13: qty 5

## 2020-04-14 ENCOUNTER — Ambulatory Visit: Payer: 59

## 2020-04-17 ENCOUNTER — Ambulatory Visit: Payer: 59

## 2020-04-17 DIAGNOSIS — C775 Secondary and unspecified malignant neoplasm of intrapelvic lymph nodes: Secondary | ICD-10-CM | POA: Diagnosis not present

## 2020-04-17 DIAGNOSIS — C20 Malignant neoplasm of rectum: Secondary | ICD-10-CM | POA: Diagnosis present

## 2020-04-17 NOTE — Progress Notes (Signed)
Pharmacist Chemotherapy Monitoring - Initial Assessment    Anticipated start date: 04/24/20  Regimen:  . Are orders appropriate based on the patient's diagnosis, regimen, and cycle? Yes . Does the plan date match the patient's scheduled date? Yes . Is the sequencing of drugs appropriate? Yes . Are the premedications appropriate for the patient's regimen? Yes . Prior Authorization for treatment is: Approved o If applicable, is the correct biosimilar selected based on the patient's insurance? not applicable  Organ Function and Labs: Marland Kitchen Are dose adjustments needed based on the patient's renal function, hepatic function, or hematologic function? No . Are appropriate labs ordered prior to the start of patient's treatment? Yes . Other organ system assessment, if indicated: N/A . The following baseline labs, if indicated, have been ordered: N/A  Dose Assessment: . Are the drug doses appropriate? Yes *Pump is ordered as total dose of 5 day pump but to be given over 4 days  . Are the following correct: o Drug concentrations Yes o IV fluid compatible with drug Yes o Administration routes Yes o Timing of therapy Yes . If applicable, does the patient have documented access for treatment and/or plans for port-a-cath placement? yes . If applicable, have lifetime cumulative doses been properly documented and assessed? yes Lifetime Dose Tracking  . Oxaliplatin: 647.847 mg/m2 (1,600 mg) = 107.97 % of the maximum lifetime dose of 600 mg/m2  o   Toxicity Monitoring/Prevention: . The patient has the following take home antiemetics prescribed: Ondansetron, Prochlorperazine and Lorazepam . The patient has the following take home medications prescribed: N/A . Medication allergies and previous infusion related reactions, if applicable, have been reviewed and addressed. Yes . The patient's current medication list has been assessed for drug-drug interactions with their chemotherapy regimen. no significant  drug-drug interactions were identified on review.  Order Review: . Are the treatment plan orders signed? No . Is the patient scheduled to see a provider prior to their treatment? Yes  I verify that I have reviewed each item in the above checklist and answered each question accordingly.  Adelina Mings 04/17/2020 8:26 AM

## 2020-04-18 ENCOUNTER — Ambulatory Visit: Payer: 59

## 2020-04-19 ENCOUNTER — Other Ambulatory Visit: Payer: Self-pay | Admitting: Internal Medicine

## 2020-04-19 ENCOUNTER — Ambulatory Visit: Payer: 59

## 2020-04-20 ENCOUNTER — Ambulatory Visit: Payer: 59

## 2020-04-21 ENCOUNTER — Ambulatory Visit: Payer: 59

## 2020-04-24 ENCOUNTER — Ambulatory Visit: Admission: RE | Admit: 2020-04-24 | Payer: 59 | Source: Ambulatory Visit

## 2020-04-24 ENCOUNTER — Ambulatory Visit: Payer: 59

## 2020-04-24 ENCOUNTER — Inpatient Hospital Stay (HOSPITAL_BASED_OUTPATIENT_CLINIC_OR_DEPARTMENT_OTHER): Payer: 59 | Admitting: Internal Medicine

## 2020-04-24 ENCOUNTER — Other Ambulatory Visit: Payer: Self-pay

## 2020-04-24 ENCOUNTER — Inpatient Hospital Stay: Payer: 59

## 2020-04-24 DIAGNOSIS — Z5111 Encounter for antineoplastic chemotherapy: Secondary | ICD-10-CM | POA: Diagnosis not present

## 2020-04-24 DIAGNOSIS — C20 Malignant neoplasm of rectum: Secondary | ICD-10-CM | POA: Diagnosis not present

## 2020-04-24 LAB — CBC WITH DIFFERENTIAL/PLATELET
Abs Immature Granulocytes: 0.09 10*3/uL — ABNORMAL HIGH (ref 0.00–0.07)
Basophils Absolute: 0.1 10*3/uL (ref 0.0–0.1)
Basophils Relative: 1 %
Eosinophils Absolute: 0.1 10*3/uL (ref 0.0–0.5)
Eosinophils Relative: 1 %
HCT: 38.4 % — ABNORMAL LOW (ref 39.0–52.0)
Hemoglobin: 12.8 g/dL — ABNORMAL LOW (ref 13.0–17.0)
Immature Granulocytes: 1 %
Lymphocytes Relative: 15 %
Lymphs Abs: 1.4 10*3/uL (ref 0.7–4.0)
MCH: 29.6 pg (ref 26.0–34.0)
MCHC: 33.3 g/dL (ref 30.0–36.0)
MCV: 88.9 fL (ref 80.0–100.0)
Monocytes Absolute: 0.9 10*3/uL (ref 0.1–1.0)
Monocytes Relative: 10 %
Neutro Abs: 6.5 10*3/uL (ref 1.7–7.7)
Neutrophils Relative %: 72 %
Platelets: 112 10*3/uL — ABNORMAL LOW (ref 150–400)
RBC: 4.32 MIL/uL (ref 4.22–5.81)
RDW: 18.6 % — ABNORMAL HIGH (ref 11.5–15.5)
WBC: 9 10*3/uL (ref 4.0–10.5)
nRBC: 0 % (ref 0.0–0.2)

## 2020-04-24 LAB — COMPREHENSIVE METABOLIC PANEL
ALT: 27 U/L (ref 0–44)
AST: 31 U/L (ref 15–41)
Albumin: 3.6 g/dL (ref 3.5–5.0)
Alkaline Phosphatase: 187 U/L — ABNORMAL HIGH (ref 38–126)
Anion gap: 9 (ref 5–15)
BUN: 14 mg/dL (ref 6–20)
CO2: 25 mmol/L (ref 22–32)
Calcium: 8.8 mg/dL — ABNORMAL LOW (ref 8.9–10.3)
Chloride: 104 mmol/L (ref 98–111)
Creatinine, Ser: 1.01 mg/dL (ref 0.61–1.24)
GFR calc Af Amer: >60 mL/min (ref >60)
GFR calc non Af Amer: >60 mL/min (ref >60)
Glucose, Bld: 100 mg/dL — ABNORMAL HIGH (ref 70–99)
Potassium: 3.8 mmol/L (ref 3.5–5.1)
Sodium: 138 mmol/L (ref 135–145)
Total Bilirubin: 0.6 mg/dL (ref 0.3–1.2)
Total Protein: 7.9 g/dL (ref 6.5–8.1)

## 2020-04-24 MED ORDER — SODIUM CHLORIDE 0.9 % IV SOLN
225.0000 mg/m2/d | INTRAVENOUS | Status: DC
  Administered 2020-04-24: 2800 mg via INTRAVENOUS
  Filled 2020-04-24: qty 56

## 2020-04-24 MED ORDER — SODIUM CHLORIDE 0.9% FLUSH
10.0000 mL | Freq: Once | INTRAVENOUS | Status: AC
  Administered 2020-04-24: 10 mL via INTRAVENOUS
  Filled 2020-04-24: qty 10

## 2020-04-24 NOTE — Progress Notes (Signed)
Cold sensitivity when drinking or eating something cold is lasting a lot longer. Feels like there is pins and needles in his mouth after drinking a cold drink. States that he is starting to now have numbness and tingling in his left hand.

## 2020-04-24 NOTE — Progress Notes (Signed)
[facility] CONSULT NOTE  Patient Care Team: Volney American, PA-C as PCP - General (Family Medicine) Clent Jacks, RN as Oncology Nurse Navigator [name], MD as Consulting Physician (Gastroenterology) Cammie Sickle, MD as Consulting Physician (Internal Medicine)  CHIEF COMPLAINTS/PURPOSE OF CONSULTATION: Rectal cancer  #  Oncology History Overview Note  # MAY 2021-rectal adenocarcinoma ; moderately differentiated; [Dr.Anna]; colonoscopy-partially obstructing circumferential rectal mass-starting at anal verge to proximal. CEA- 30. CT C/A/P- Large mass involving the rectum is identified compatible with primary rectal carcinoma. This has a large, 6.7 cm exophytic component in the left pelvis. Multiple perirectal lymph nodes are identified compatible with metastatic adenopathy; Borderline enlarged aortocaval node measures 0.9 cm; PET-bulky rectal mass; multiple lymph nodes noted; no uptake in the retroperitoneal lymph nodes; MRI rectum- T3N2M0;   Pretreatment CEA 94; TNT  # June 1st 2021- FOLFOX  # SURVIVORSHIP:   # GENETICS:   DIAGNOSIS: RECTAL CA  STAGE:   III     ;  GOALS: cure  CURRENT/MOST RECENT THERAPY : FOLFOX [C]   Rectal cancer ([place])  12/20/2019 Initial Diagnosis   Rectal cancer ([place])   01/04/2020 - 04/11/2020 Chemotherapy   The patient had dexamethasone (DECADRON) 4 MG tablet, 8 mg, Oral, Daily, 1 of 1 cycle, Start date: --, End date: -- palonosetron (ALOXI) injection 0.25 mg, 0.25 mg, Intravenous,  Once, 8 of 8 cycles Administration: 0.25 mg (01/04/2020), 0.25 mg (01/17/2020), 0.25 mg (01/31/2020), 0.25 mg (02/14/2020), 0.25 mg (02/28/2020), 0.25 mg (03/13/2020), 0.25 mg (04/11/2020) pegfilgrastim (NEULASTA ONPRO KIT) injection 6 mg, 6 mg, Subcutaneous, Once, 2 of 2 cycles Administration: 6 mg (02/02/2020) leucovorin 1,000 mg in dextrose 5 % 250 mL infusion, 1,008 mg, Intravenous,  Once, 8 of 8 cycles Administration: 1,000 mg (01/04/2020), 1,000 mg  (01/17/2020), 1,000 mg (01/31/2020), 1,000 mg (02/14/2020), 1,000 mg (02/28/2020), 1,000 mg (03/13/2020), 1,000 mg (04/11/2020) oxaliplatin (ELOXATIN) 200 mg in dextrose 5 % 500 mL chemo infusion, 215 mg, Intravenous,  Once, 8 of 8 cycles Administration: 200 mg (01/04/2020), 200 mg (01/17/2020), 200 mg (01/31/2020), 200 mg (02/14/2020), 200 mg (02/28/2020), 200 mg (03/13/2020), 200 mg (04/11/2020) fluorouracil (ADRUCIL) chemo injection 1,000 mg, 400 mg/m2 = 1,000 mg, Intravenous,  Once, 2 of 2 cycles Administration: 1,000 mg (01/04/2020), 1,000 mg (01/17/2020) fluorouracil (ADRUCIL) 6,050 mg in sodium chloride 0.9 % 129 mL chemo infusion, 2,400 mg/m2 = 6,050 mg, Intravenous, 1 Day/Dose, 8 of 8 cycles Administration: 6,050 mg (01/04/2020), 6,050 mg (01/17/2020), 6,050 mg (01/31/2020), 6,050 mg (02/14/2020), 6,050 mg (02/28/2020), 6,050 mg (03/13/2020), 6,050 mg (04/11/2020)  for chemotherapy treatment.    04/24/2020 -  Chemotherapy   The patient had fluorouracil (ADRUCIL) 2,800 mg in sodium chloride 0.9 % 94 mL chemo infusion, 225 mg/m2/day = 2,800 mg, Intravenous, 5D (120 hours), 1 of 5 cycles  for chemotherapy treatment.       HISTORY OF PRESENTING ILLNESS:  Stephen Vasquez 59 y.o.  male  With stage III rectal cancer currently on TNT-FOLFOX chemotherapy is here for follow-up.  Patient finished cycle #8 of FOLFOX 2 weeks ago.  Patient is here to start 5-FU continuous infusion along with radiation.  Denies any significant nausea vomiting denies abdominal pain.  Gaining weight.  States to be eating well.  No swelling in the legs.  Review of Systems  Constitutional: Negative for chills, diaphoresis, fever, malaise/fatigue and weight loss.  HENT: Negative for nosebleeds and sore throat.   Eyes: Negative for double vision.  Respiratory: Negative for cough, hemoptysis, sputum production,  shortness of breath and wheezing.   Cardiovascular: Negative for chest pain, palpitations, orthopnea and leg swelling.  Gastrointestinal:  Negative for abdominal pain, constipation, diarrhea, heartburn, melena, nausea and vomiting.  Genitourinary: Negative for dysuria, frequency and urgency.  Musculoskeletal: Positive for joint pain. Negative for back pain.  Skin: Negative.  Negative for itching and rash.  Neurological: Positive for tingling. Negative for dizziness, focal weakness, weakness and headaches.  Endo/Heme/Allergies: Does not bruise/bleed easily.  Psychiatric/Behavioral: Negative for depression. The patient is not nervous/anxious and does not have insomnia.      MEDICAL HISTORY:  Past Medical History:  Diagnosis Date  . Allergic rhinitis   . Heart murmur   . Rectal cancer [facility])     SURGICAL HISTORY: Past Surgical History:  Procedure Laterality Date  . COLONOSCOPY WITH PROPOFOL N/A 12/15/2019   Procedure: COLONOSCOPY WITH PROPOFOL;  Surgeon: [name], MD;  Location: [facility] ENDOSCOPY;  Service: Gastroenterology;  Laterality: N/A;  . PORTACATH PLACEMENT Left 12/29/2019   Procedure: INSERTION PORT-A-CATH;  Surgeon: Robert Bellow, MD;  Location: [facility] ORS;  Service: General;  Laterality: Left;    SOCIAL HISTORY: Social History   Socioeconomic History  . Marital status: Married    Spouse name: Not on file  . Number of children: Not on file  . Years of education: Not on file  . Highest education level: Not on file  Occupational History  . Not on file  Tobacco Use  . Smoking status: Former Research scientist (life sciences)  . Smokeless tobacco: Never Used  Vaping Use  . Vaping Use: Never used  Substance and Sexual Activity  . Alcohol use: Not Currently  . Drug use: Never  . Sexual activity: Yes  Other Topics Concern  . Not on file  Social History Narrative   Lives in [place]; Freight forwarder at [employer]; 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 2 children- in boy and girls in late 40s.    Social Determinants of Health   Financial Resource Strain:   . Difficulty of Paying Living Expenses: Not on file  Food Insecurity:   .  Worried About Charity fundraiser in the Last Year: Not on file  . Ran Out of Food in the Last Year: Not on file  Transportation Needs:   . Lack of Transportation (Medical): Not on file  . Lack of Transportation (Non-Medical): Not on file  Physical Activity:   . Days of Exercise per Week: Not on file  . Minutes of Exercise per Session: Not on file  Stress:   . Feeling of Stress : Not on file  Social Connections:   . Frequency of Communication with Friends and Family: Not on file  . Frequency of Social Gatherings with Friends and Family: Not on file  . Attends Religious Services: Not on file  . Active Member of Clubs or Organizations: Not on file  . Attends Archivist Meetings: Not on file  . Marital Status: Not on file  Intimate Partner Violence:   . Fear of Current or Ex-Partner: Not on file  . Emotionally Abused: Not on file  . Physically Abused: Not on file  . Sexually Abused: Not on file    FAMILY HISTORY: Family History  Problem Relation Age of Onset  . Hypertension Mother   . Hypertension Father   . Stomach cancer Father        in 53s- survived.   Marland Kitchen Heart disease Brother     ALLERGIES:  has No Known Allergies.  MEDICATIONS:  Current Outpatient Medications  Medication Sig Dispense Refill  . azelastine (ASTELIN) 0.1 % nasal spray Place 2 sprays into both nostrils 2 (two) times daily. Use in each nostril as directed 30 mL 12  . lidocaine-prilocaine (EMLA) cream Apply 1 application topically as needed. (Patient taking differently: Apply 1 application topically as needed (port access). ) 30 g 0  . montelukast (SINGULAIR) 10 MG tablet Take 1 tablet (10 mg total) by mouth at bedtime. 30 tablet 3  . naproxen sodium (ALEVE) 220 MG tablet Take 220 mg by mouth 2 (two) times daily as needed (pain).    . ondansetron (ZOFRAN) 8 MG tablet One pill every 8 hours as needed for nausea/vomitting. 40 tablet 1  . oxymetazoline (AFRIN) 0.05 % nasal spray Place 1 spray into both  nostrils 2 (two) times daily as needed for congestion.    . prochlorperazine (COMPAZINE) 10 MG tablet Take 1 tablet (10 mg total) by mouth every 6 (six) hours as needed for nausea or vomiting. 40 tablet 1  . dicyclomine (BENTYL) 10 MG capsule Take 1 capsule (10 mg total) by mouth 4 (four) times daily -  before meals and at bedtime. (Patient not taking: Reported on 03/01/2020) 120 capsule 1  . HYDROcodone-acetaminophen (NORCO/VICODIN) 5-325 MG tablet Take 1 tablet by mouth every 8 (eight) hours as needed for moderate pain. (Patient not taking: Reported on 03/28/2020) 45 tablet 0   No current facility-administered medications for this visit.   Facility-Administered Medications Ordered in Other Visits  Medication Dose Route Frequency Provider Last Rate Last Admin  . fluorouracil (ADRUCIL) 2,800 mg in sodium chloride 0.9 % 94 mL chemo infusion  225 mg/m2/day (Treatment Plan Recorded) Intravenous 5 days Cammie Sickle, MD          .  PHYSICAL EXAMINATION: ECOG PERFORMANCE STATUS: 1 - Symptomatic but completely ambulatory  Vitals:   04/24/20 0844  BP: 128/83  Pulse: 68  Resp: 16  Temp: (!) 96.8 F (36 C)  SpO2: 100%   Filed Weights   04/24/20 0844  Weight: 266 lb (120.7 kg)    Physical Exam Constitutional:      Comments: He is accompanied by his wife.  Walk independently.  HENT:     Head: Normocephalic and atraumatic.     Mouth/Throat:     Pharynx: No oropharyngeal exudate.  Eyes:     Pupils: Pupils are equal, round, and reactive to light.  Cardiovascular:     Rate and Rhythm: Normal rate and regular rhythm.  Pulmonary:     Effort: Pulmonary effort is normal. No respiratory distress.     Breath sounds: Normal breath sounds. No wheezing.  Abdominal:     General: Bowel sounds are normal. There is no distension.     Palpations: Abdomen is soft. There is no mass.     Tenderness: There is no abdominal tenderness. There is no guarding or rebound.  Musculoskeletal:         General: No tenderness. Normal range of motion.     Cervical back: Normal range of motion and neck supple.  Skin:    General: Skin is warm.  Neurological:     Mental Status: He is alert and oriented to person, place, and time.  Psychiatric:        Mood and Affect: Affect normal.      LABORATORY DATA:  I have reviewed the data as listed Lab Results  Component Value Date   WBC 9.0 04/24/2020   HGB 12.8 (L) 04/24/2020  HCT 38.4 (L) 04/24/2020   MCV 88.9 04/24/2020   PLT 112 (L) 04/24/2020   Recent Labs    03/28/20 0813 04/11/20 0822 04/24/20 0831  NA 137 138 138  K 4.0 3.9 3.8  CL 104 106 104  CO2 26 25 25   GLUCOSE 100* 107* 100*  BUN 19 20 14   CREATININE 1.00 1.06 1.01  CALCIUM 9.1 8.8* 8.8*  GFRNONAA >60 >60 >60  GFRAA >60 >60 >60  PROT 8.2* 7.7 7.9  ALBUMIN 3.8 3.7 3.6  AST 35 34 31  ALT 33 29 27  ALKPHOS 182* 191* 187*  BILITOT 0.5 0.6 0.6    RADIOGRAPHIC STUDIES: I have personally reviewed the radiological images as listed and agreed with the findings in the report. No results found.  ASSESSMENT & PLAN:   Rectal cancer ([place]) #Rectal adenocarcinoma- moderate differentiated; stage III [multiple pelvic lymph nodes]; question borderline 9 mm retroperitoneal neuropathy. PET-bulky rectal mass; multiple lymph nodes noted; no uptake in the retroperitoneal lymph nodes; MRI rectum- T3N2M0;   Pretreatment CEA 94. ON TNT [total neoadjuvant therapy]; currently on FOLFOX chemotherapy. CEA- pre-treatment- ~100; CEA- 4.2; STABLE. s/p cycle #8 of FOLFOX chemotherapy.  # Proceed with  5FU- CIV M-F; RT [ 9/20-10/27]  Labs today reviewed;  acceptable for treatment today.  Discussed with Dr. Tollie Pizza; the office will send a referral to Dr. Audie Clear.   # Rising alkaline phosphatase-today -185- likely from chemotherapy- STABLE; monitor closely.   # PN-cold sensistivity-G-1- STABLE.   # Iron deficient anemia/chronic GI bleed-Hb13.2; HOLD IV iron today  # Peri-rectal pain- STABLE.    Viicodin prn.   DISPOSITION:  # chemo today; pump off 9/24 # 1 week- labs- cbc/cmp; 5FU pump; DC pump on October 1st #in 2 weeks- labs- cbc/CMP-5FU pump; DC pump on oct 8th-Dr.B   All questions were answered. The patient knows to call the clinic with any problems, questions or concerns.    Cammie Sickle, MD 04/24/2020 9:26 AM

## 2020-04-24 NOTE — Assessment & Plan Note (Addendum)
#  Rectal adenocarcinoma- moderate differentiated; stage III [multiple pelvic lymph nodes]; question borderline 9 mm retroperitoneal neuropathy. PET-bulky rectal mass; multiple lymph nodes noted; no uptake in the retroperitoneal lymph nodes; MRI rectum- T3N2M0;   Pretreatment CEA 94. ON TNT [total neoadjuvant therapy]; currently on FOLFOX chemotherapy. CEA- pre-treatment- ~100; CEA- 4.2; STABLE. s/p cycle #8 of FOLFOX chemotherapy.  # Proceed with  5FU- CIV M-F; RT [ 9/20-10/27]  Labs today reviewed;  acceptable for treatment today.  Discussed with Dr. Tollie Pizza; the office will send a referral to Dr. Audie Clear.   # Rising alkaline phosphatase-today -185- likely from chemotherapy- STABLE; monitor closely.   # PN-cold sensistivity-G-1- STABLE.   # Iron deficient anemia/chronic GI bleed-Hb13.2; HOLD IV iron today  # Peri-rectal pain- STABLE.   Viicodin prn.   DISPOSITION:  # chemo today; pump off 9/24 # 1 week- labs- cbc/cmp; 5FU pump; DC pump on October 1st #in 2 weeks- labs- cbc/CMP-5FU pump; DC pump on oct 8th-Dr.B

## 2020-04-25 ENCOUNTER — Ambulatory Visit: Payer: 59

## 2020-04-25 ENCOUNTER — Ambulatory Visit
Admission: RE | Admit: 2020-04-25 | Discharge: 2020-04-25 | Disposition: A | Discharge status: Home / Self Care | Payer: 59 | Source: Ambulatory Visit | Attending: Radiation Oncology | Admitting: Radiation Oncology

## 2020-04-25 DIAGNOSIS — C775 Secondary and unspecified malignant neoplasm of intrapelvic lymph nodes: Secondary | ICD-10-CM | POA: Diagnosis not present

## 2020-04-25 LAB — CEA: CEA: 2 ng/mL (ref 0.0–4.7)

## 2020-04-26 ENCOUNTER — Ambulatory Visit: Payer: 59

## 2020-04-26 ENCOUNTER — Ambulatory Visit
Admission: RE | Admit: 2020-04-26 | Discharge: 2020-04-26 | Disposition: A | Discharge status: Home / Self Care | Payer: 59 | Source: Ambulatory Visit | Attending: Radiation Oncology | Admitting: Radiation Oncology

## 2020-04-26 DIAGNOSIS — C775 Secondary and unspecified malignant neoplasm of intrapelvic lymph nodes: Secondary | ICD-10-CM | POA: Diagnosis not present

## 2020-04-27 ENCOUNTER — Ambulatory Visit
Admission: RE | Admit: 2020-04-27 | Discharge: 2020-04-27 | Disposition: A | Discharge status: Home / Self Care | Payer: 59 | Source: Ambulatory Visit | Attending: Radiation Oncology | Admitting: Radiation Oncology

## 2020-04-27 ENCOUNTER — Ambulatory Visit: Payer: 59

## 2020-04-27 DIAGNOSIS — C775 Secondary and unspecified malignant neoplasm of intrapelvic lymph nodes: Secondary | ICD-10-CM | POA: Diagnosis not present

## 2020-04-28 ENCOUNTER — Ambulatory Visit
Admission: RE | Admit: 2020-04-28 | Discharge: 2020-04-28 | Disposition: A | Discharge status: Home / Self Care | Payer: 59 | Source: Ambulatory Visit | Attending: Radiation Oncology | Admitting: Radiation Oncology

## 2020-04-28 ENCOUNTER — Other Ambulatory Visit: Payer: Self-pay

## 2020-04-28 ENCOUNTER — Ambulatory Visit: Payer: 59

## 2020-04-28 ENCOUNTER — Inpatient Hospital Stay: Payer: 59

## 2020-04-28 DIAGNOSIS — Z5111 Encounter for antineoplastic chemotherapy: Secondary | ICD-10-CM | POA: Diagnosis not present

## 2020-04-28 DIAGNOSIS — C775 Secondary and unspecified malignant neoplasm of intrapelvic lymph nodes: Secondary | ICD-10-CM | POA: Diagnosis not present

## 2020-04-28 DIAGNOSIS — C20 Malignant neoplasm of rectum: Secondary | ICD-10-CM

## 2020-04-28 MED ORDER — HEPARIN SOD (PORK) LOCK FLUSH 100 UNIT/ML IV SOLN
500.0000 [IU] | Freq: Once | INTRAVENOUS | Status: AC | PRN
  Administered 2020-04-28: 500 [IU]
  Filled 2020-04-28: qty 5

## 2020-04-28 MED ORDER — SODIUM CHLORIDE 0.9% FLUSH
10.0000 mL | INTRAVENOUS | Status: DC | PRN
  Administered 2020-04-28: 10 mL
  Filled 2020-04-28: qty 10

## 2020-04-29 ENCOUNTER — Ambulatory Visit: Payer: 59

## 2020-04-30 ENCOUNTER — Ambulatory Visit: Payer: 59

## 2020-05-01 ENCOUNTER — Inpatient Hospital Stay: Payer: 59

## 2020-05-01 ENCOUNTER — Other Ambulatory Visit: Payer: Self-pay

## 2020-05-01 ENCOUNTER — Inpatient Hospital Stay: Payer: 59 | Admitting: Internal Medicine

## 2020-05-01 ENCOUNTER — Ambulatory Visit: Payer: 59

## 2020-05-01 ENCOUNTER — Ambulatory Visit
Admission: RE | Admit: 2020-05-01 | Discharge: 2020-05-01 | Disposition: A | Discharge status: Home / Self Care | Payer: 59 | Source: Ambulatory Visit | Attending: Radiation Oncology | Admitting: Radiation Oncology

## 2020-05-01 VITALS — BP 129/86 | HR 72 | Temp 96.1°F | Resp 18 | Wt 265.4 lb

## 2020-05-01 DIAGNOSIS — C775 Secondary and unspecified malignant neoplasm of intrapelvic lymph nodes: Secondary | ICD-10-CM | POA: Diagnosis not present

## 2020-05-01 DIAGNOSIS — Z5111 Encounter for antineoplastic chemotherapy: Secondary | ICD-10-CM | POA: Diagnosis not present

## 2020-05-01 DIAGNOSIS — C20 Malignant neoplasm of rectum: Secondary | ICD-10-CM

## 2020-05-01 LAB — COMPREHENSIVE METABOLIC PANEL
ALT: 31 U/L (ref 0–44)
AST: 38 U/L (ref 15–41)
Albumin: 3.6 g/dL (ref 3.5–5.0)
Alkaline Phosphatase: 159 U/L — ABNORMAL HIGH (ref 38–126)
Anion gap: 5 (ref 5–15)
BUN: 21 mg/dL — ABNORMAL HIGH (ref 6–20)
CO2: 25 mmol/L (ref 22–32)
Calcium: 8.7 mg/dL — ABNORMAL LOW (ref 8.9–10.3)
Chloride: 107 mmol/L (ref 98–111)
Creatinine, Ser: 1.01 mg/dL (ref 0.61–1.24)
GFR calc Af Amer: >60 mL/min (ref >60)
GFR calc non Af Amer: >60 mL/min (ref >60)
Glucose, Bld: 99 mg/dL (ref 70–99)
Potassium: 4 mmol/L (ref 3.5–5.1)
Sodium: 137 mmol/L (ref 135–145)
Total Bilirubin: 0.5 mg/dL (ref 0.3–1.2)
Total Protein: 7.9 g/dL (ref 6.5–8.1)

## 2020-05-01 LAB — CBC WITH DIFFERENTIAL/PLATELET
Abs Immature Granulocytes: 0.03 10*3/uL (ref 0.00–0.07)
Basophils Absolute: 0 10*3/uL (ref 0.0–0.1)
Basophils Relative: 1 %
Eosinophils Absolute: 0.1 10*3/uL (ref 0.0–0.5)
Eosinophils Relative: 1 %
HCT: 37.3 % — ABNORMAL LOW (ref 39.0–52.0)
Hemoglobin: 12.5 g/dL — ABNORMAL LOW (ref 13.0–17.0)
Immature Granulocytes: 0 %
Lymphocytes Relative: 15 %
Lymphs Abs: 1.1 10*3/uL (ref 0.7–4.0)
MCH: 30 pg (ref 26.0–34.0)
MCHC: 33.5 g/dL (ref 30.0–36.0)
MCV: 89.7 fL (ref 80.0–100.0)
Monocytes Absolute: 0.5 10*3/uL (ref 0.1–1.0)
Monocytes Relative: 7 %
Neutro Abs: 5.5 10*3/uL (ref 1.7–7.7)
Neutrophils Relative %: 76 %
Platelets: 182 10*3/uL (ref 150–400)
RBC: 4.16 MIL/uL — ABNORMAL LOW (ref 4.22–5.81)
RDW: 17.4 % — ABNORMAL HIGH (ref 11.5–15.5)
WBC: 7.2 10*3/uL (ref 4.0–10.5)
nRBC: 0 % (ref 0.0–0.2)

## 2020-05-01 MED ORDER — SODIUM CHLORIDE 0.9% FLUSH
10.0000 mL | INTRAVENOUS | Status: DC | PRN
  Administered 2020-05-01: 10 mL via INTRAVENOUS
  Filled 2020-05-01: qty 10

## 2020-05-01 MED ORDER — SODIUM CHLORIDE 0.9 % IV SOLN
225.0000 mg/m2/d | INTRAVENOUS | Status: DC
  Administered 2020-05-01: 2800 mg via INTRAVENOUS
  Filled 2020-05-01: qty 56

## 2020-05-01 NOTE — Assessment & Plan Note (Signed)
#  Rectal adenocarcinoma- moderate differentiated; stage III [multiple pelvic lymph nodes]; question borderline 9 mm retroperitoneal neuropathy. PET-bulky rectal mass; multiple lymph nodes noted; no uptake in the retroperitoneal lymph nodes; MRI rectum- T3N2M0;   Pretreatment CEA 94. ON TNT [total neoadjuvant therapy]; currently on FOLFOX chemotherapy. CEA- pre-treatment- ~100; CEA- 4.2; STABLE. s/p cycle #8 of FOLFOX chemotherapy.  # Proceed with  5FU- CIV M-F; RT [ 9/20-10/27]  Labs today reviewed;  acceptable for treatment today.  Discussed with Dr. Tollie Pizza; the office will send a referral to Dr. Audie Clear.   # Rising alkaline phosphatase-today -185- likely from chemotherapy- STABLE; monitor closely.   # PN-cold sensistivity-G-1- STABLE.   # Iron deficient anemia/chronic GI bleed-Hb13.2; HOLD IV iron today  # Peri-rectal pain- STABLE.   Viicodin prn.   DISPOSITION:  # chemo today; pump off 9/24 # 1 week- labs- cbc/cmp; 5FU pump; DC pump on October 1st #in 2 weeks- labs- cbc/CMP-5FU pump; DC pump on oct 8th-Dr.B

## 2020-05-01 NOTE — Progress Notes (Unsigned)
[place] CONSULT NOTE  Patient Care Team: Volney American, PA-C as PCP - General (Family Medicine) Clent Jacks, RN as Oncology Nurse Navigator [name], MD as Consulting Physician (Gastroenterology) Cammie Sickle, MD as Consulting Physician (Internal Medicine)  CHIEF COMPLAINTS/PURPOSE OF CONSULTATION: Rectal cancer  #  Oncology History Overview Note  # MAY 2021-rectal adenocarcinoma ; moderately differentiated; [Dr.Anna]; colonoscopy-partially obstructing circumferential rectal mass-starting at anal verge to proximal. CEA- 88. CT C/A/P- Large mass involving the rectum is identified compatible with primary rectal carcinoma. This has a large, 6.7 cm exophytic component in the left pelvis. Multiple perirectal lymph nodes are identified compatible with metastatic adenopathy; Borderline enlarged aortocaval node measures 0.9 cm; PET-bulky rectal mass; multiple lymph nodes noted; no uptake in the retroperitoneal lymph nodes; MRI rectum- T3N2M0;   Pretreatment CEA 94; TNT  # June 1st 2021- FOLFOX  # SURVIVORSHIP:   # GENETICS:   DIAGNOSIS: RECTAL CA  STAGE:   III     ;  GOALS: cure  CURRENT/MOST RECENT THERAPY : FOLFOX [C]   Rectal cancer ([place])  12/20/2019 Initial Diagnosis   Rectal cancer ([place])   01/04/2020 - 04/11/2020 Chemotherapy   The patient had dexamethasone (DECADRON) 4 MG tablet, 8 mg, Oral, Daily, 1 of 1 cycle, Start date: --, End date: -- palonosetron (ALOXI) injection 0.25 mg, 0.25 mg, Intravenous,  Once, 8 of 8 cycles Administration: 0.25 mg (01/04/2020), 0.25 mg (01/17/2020), 0.25 mg (01/31/2020), 0.25 mg (02/14/2020), 0.25 mg (02/28/2020), 0.25 mg (03/13/2020), 0.25 mg (04/11/2020) pegfilgrastim (NEULASTA ONPRO KIT) injection 6 mg, 6 mg, Subcutaneous, Once, 2 of 2 cycles Administration: 6 mg (02/02/2020) leucovorin 1,000 mg in dextrose 5 % 250 mL infusion, 1,008 mg, Intravenous,  Once, 8 of 8 cycles Administration: 1,000 mg (01/04/2020), 1,000 mg  (01/17/2020), 1,000 mg (01/31/2020), 1,000 mg (02/14/2020), 1,000 mg (02/28/2020), 1,000 mg (03/13/2020), 1,000 mg (04/11/2020) oxaliplatin (ELOXATIN) 200 mg in dextrose 5 % 500 mL chemo infusion, 215 mg, Intravenous,  Once, 8 of 8 cycles Administration: 200 mg (01/04/2020), 200 mg (01/17/2020), 200 mg (01/31/2020), 200 mg (02/14/2020), 200 mg (02/28/2020), 200 mg (03/13/2020), 200 mg (04/11/2020) fluorouracil (ADRUCIL) chemo injection 1,000 mg, 400 mg/m2 = 1,000 mg, Intravenous,  Once, 2 of 2 cycles Administration: 1,000 mg (01/04/2020), 1,000 mg (01/17/2020) fluorouracil (ADRUCIL) 6,050 mg in sodium chloride 0.9 % 129 mL chemo infusion, 2,400 mg/m2 = 6,050 mg, Intravenous, 1 Day/Dose, 8 of 8 cycles Administration: 6,050 mg (01/04/2020), 6,050 mg (01/17/2020), 6,050 mg (01/31/2020), 6,050 mg (02/14/2020), 6,050 mg (02/28/2020), 6,050 mg (03/13/2020), 6,050 mg (04/11/2020)  for chemotherapy treatment.    04/24/2020 -  Chemotherapy   The patient had fluorouracil (ADRUCIL) 2,800 mg in sodium chloride 0.9 % 94 mL chemo infusion, 225 mg/m2/day = 2,800 mg, Intravenous, 5D (120 hours), 1 of 5 cycles Administration: 2,800 mg (04/24/2020)  for chemotherapy treatment.       HISTORY OF PRESENTING ILLNESS:  Stephen Vasquez 59 y.o.  male  With stage III rectal cancer currently on TNT-FOLFOX chemotherapy is here for follow-up.  Patient finished cycle #8 of FOLFOX 2 weeks ago.  Patient is here to start 5-FU continuous infusion along with radiation.  Denies any significant nausea vomiting denies abdominal pain.  Gaining weight.  States to be eating well.  No swelling in the legs.  Review of Systems  Constitutional: Negative for chills, diaphoresis, fever, malaise/fatigue and weight loss.  HENT: Negative for nosebleeds and sore throat.   Eyes: Negative for double vision.  Respiratory: Negative for  cough, hemoptysis, sputum production, shortness of breath and wheezing.   Cardiovascular: Negative for chest pain, palpitations, orthopnea and  leg swelling.  Gastrointestinal: Negative for abdominal pain, constipation, diarrhea, heartburn, melena, nausea and vomiting.  Genitourinary: Negative for dysuria, frequency and urgency.  Musculoskeletal: Positive for joint pain. Negative for back pain.  Skin: Negative.  Negative for itching and rash.  Neurological: Positive for tingling. Negative for dizziness, focal weakness, weakness and headaches.  Endo/Heme/Allergies: Does not bruise/bleed easily.  Psychiatric/Behavioral: Negative for depression. The patient is not nervous/anxious and does not have insomnia.      MEDICAL HISTORY:  Past Medical History:  Diagnosis Date  . Allergic rhinitis   . Heart murmur   . Rectal cancer [facility])     SURGICAL HISTORY: Past Surgical History:  Procedure Laterality Date  . COLONOSCOPY WITH PROPOFOL N/A 12/15/2019   Procedure: COLONOSCOPY WITH PROPOFOL;  Surgeon: [name], MD;  Location: [facility] ENDOSCOPY;  Service: Gastroenterology;  Laterality: N/A;  . PORTACATH PLACEMENT Left 12/29/2019   Procedure: INSERTION PORT-A-CATH;  Surgeon: Robert Bellow, MD;  Location: [facility] ORS;  Service: General;  Laterality: Left;    SOCIAL HISTORY: Social History   Socioeconomic History  . Marital status: Married    Spouse name: Not on file  . Number of children: Not on file  . Years of education: Not on file  . Highest education level: Not on file  Occupational History  . Not on file  Tobacco Use  . Smoking status: Former Research scientist (life sciences)  . Smokeless tobacco: Never Used  Vaping Use  . Vaping Use: Never used  Substance and Sexual Activity  . Alcohol use: Not Currently  . Drug use: Never  . Sexual activity: Yes  Other Topics Concern  . 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 2 children- in boy and girls in late 81s.    Social Determinants of Health   Financial Resource Strain:   . Difficulty of Paying Living Expenses: Not on  file  Food Insecurity:   . Worried About Charity fundraiser in the Last Year: Not on file  . Ran Out of Food in the Last Year: Not on file  Transportation Needs:   . Lack of Transportation (Medical): Not on file  . Lack of Transportation (Non-Medical): Not on file  Physical Activity:   . Days of Exercise per Week: Not on file  . Minutes of Exercise per Session: Not on file  Stress:   . Feeling of Stress : Not on file  Social Connections:   . Frequency of Communication with Friends and Family: Not on file  . Frequency of Social Gatherings with Friends and Family: Not on file  . Attends Religious Services: Not on file  . Active Member of Clubs or Organizations: Not on file  . Attends Archivist Meetings: Not on file  . Marital Status: Not on file  Intimate Partner Violence:   . Fear of Current or Ex-Partner: Not on file  . Emotionally Abused: Not on file  . Physically Abused: Not on file  . Sexually Abused: Not on file    FAMILY HISTORY: Family History  Problem Relation Age of Onset  . Hypertension Mother   . Hypertension Father   . Stomach cancer Father        in 63s- survived.   Marland Kitchen Heart disease Brother     ALLERGIES:  has No Known  Allergies.  MEDICATIONS:  Current Outpatient Medications  Medication Sig Dispense Refill  . azelastine (ASTELIN) 0.1 % nasal spray Place 2 sprays into both nostrils 2 (two) times daily. Use in each nostril as directed 30 mL 12  . dicyclomine (BENTYL) 10 MG capsule Take 1 capsule (10 mg total) by mouth 4 (four) times daily -  before meals and at bedtime. (Patient not taking: Reported on 03/01/2020) 120 capsule 1  . HYDROcodone-acetaminophen (NORCO/VICODIN) 5-325 MG tablet Take 1 tablet by mouth every 8 (eight) hours as needed for moderate pain. (Patient not taking: Reported on 03/28/2020) 45 tablet 0  . lidocaine-prilocaine (EMLA) cream Apply 1 application topically as needed. (Patient taking differently: Apply 1 application topically as  needed (port access). ) 30 g 0  . montelukast (SINGULAIR) 10 MG tablet Take 1 tablet (10 mg total) by mouth at bedtime. 30 tablet 3  . naproxen sodium (ALEVE) 220 MG tablet Take 220 mg by mouth 2 (two) times daily as needed (pain).    . ondansetron (ZOFRAN) 8 MG tablet One pill every 8 hours as needed for nausea/vomitting. 40 tablet 1  . oxymetazoline (AFRIN) 0.05 % nasal spray Place 1 spray into both nostrils 2 (two) times daily as needed for congestion.    . prochlorperazine (COMPAZINE) 10 MG tablet Take 1 tablet (10 mg total) by mouth every 6 (six) hours as needed for nausea or vomiting. 40 tablet 1   No current facility-administered medications for this visit.      Marland Kitchen  PHYSICAL EXAMINATION: ECOG PERFORMANCE STATUS: 1 - Symptomatic but completely ambulatory  There were no vitals filed for this visit. There were no vitals filed for this visit.  Physical Exam Constitutional:      Comments: He is accompanied by his wife.  Walk independently.  HENT:     Head: Normocephalic and atraumatic.     Mouth/Throat:     Pharynx: No oropharyngeal exudate.  Eyes:     Pupils: Pupils are equal, round, and reactive to light.  Cardiovascular:     Rate and Rhythm: Normal rate and regular rhythm.  Pulmonary:     Effort: Pulmonary effort is normal. No respiratory distress.     Breath sounds: Normal breath sounds. No wheezing.  Abdominal:     General: Bowel sounds are normal. There is no distension.     Palpations: Abdomen is soft. There is no mass.     Tenderness: There is no abdominal tenderness. There is no guarding or rebound.  Musculoskeletal:        General: No tenderness. Normal range of motion.     Cervical back: Normal range of motion and neck supple.  Skin:    General: Skin is warm.  Neurological:     Mental Status: He is alert and oriented to person, place, and time.  Psychiatric:        Mood and Affect: Affect normal.      LABORATORY DATA:  I have reviewed the data as  listed Lab Results  Component Value Date   WBC 9.0 04/24/2020   HGB 12.8 (L) 04/24/2020   HCT 38.4 (L) 04/24/2020   MCV 88.9 04/24/2020   PLT 112 (L) 04/24/2020   Recent Labs    03/28/20 0813 04/11/20 0822 04/24/20 0831  NA 137 138 138  K 4.0 3.9 3.8  CL 104 106 104  CO2 26 25 25   GLUCOSE 100* 107* 100*  BUN 19 20 14   CREATININE 1.00 1.06 1.01  CALCIUM 9.1 8.8* 8.8*  GFRNONAA >60 >60 >60  GFRAA >60 >60 >60  PROT 8.2* 7.7 7.9  ALBUMIN 3.8 3.7 3.6  AST 35 34 31  ALT 33 29 27  ALKPHOS 182* 191* 187*  BILITOT 0.5 0.6 0.6    RADIOGRAPHIC STUDIES: I have personally reviewed the radiological images as listed and agreed with the findings in the report. No results found.  ASSESSMENT & PLAN:   No problem-specific Assessment & Plan notes found for this encounter.  All questions were answered. The patient knows to call the clinic with any problems, questions or concerns.    Cammie Sickle, MD 05/01/2020 8:30 AM

## 2020-05-02 ENCOUNTER — Ambulatory Visit
Admission: RE | Admit: 2020-05-02 | Discharge: 2020-05-02 | Disposition: A | Discharge status: Home / Self Care | Payer: 59 | Source: Ambulatory Visit | Attending: Radiation Oncology | Admitting: Radiation Oncology

## 2020-05-02 DIAGNOSIS — C775 Secondary and unspecified malignant neoplasm of intrapelvic lymph nodes: Secondary | ICD-10-CM | POA: Diagnosis not present

## 2020-05-03 ENCOUNTER — Ambulatory Visit
Admission: RE | Admit: 2020-05-03 | Discharge: 2020-05-03 | Disposition: A | Discharge status: Home / Self Care | Payer: 59 | Source: Ambulatory Visit | Attending: Radiation Oncology | Admitting: Radiation Oncology

## 2020-05-03 DIAGNOSIS — C775 Secondary and unspecified malignant neoplasm of intrapelvic lymph nodes: Secondary | ICD-10-CM | POA: Diagnosis not present

## 2020-05-04 ENCOUNTER — Ambulatory Visit
Admission: RE | Admit: 2020-05-04 | Discharge: 2020-05-04 | Disposition: A | Discharge status: Home / Self Care | Payer: 59 | Source: Ambulatory Visit | Attending: Radiation Oncology | Admitting: Radiation Oncology

## 2020-05-04 DIAGNOSIS — C775 Secondary and unspecified malignant neoplasm of intrapelvic lymph nodes: Secondary | ICD-10-CM | POA: Diagnosis not present

## 2020-05-05 ENCOUNTER — Inpatient Hospital Stay: Payer: 59 | Attending: Internal Medicine

## 2020-05-05 ENCOUNTER — Other Ambulatory Visit: Payer: Self-pay

## 2020-05-05 ENCOUNTER — Ambulatory Visit
Admission: RE | Admit: 2020-05-05 | Discharge: 2020-05-05 | Disposition: A | Discharge status: Home / Self Care | Payer: 59 | Source: Ambulatory Visit | Attending: Radiation Oncology | Admitting: Radiation Oncology

## 2020-05-05 DIAGNOSIS — C20 Malignant neoplasm of rectum: Secondary | ICD-10-CM | POA: Insufficient documentation

## 2020-05-05 DIAGNOSIS — Z5111 Encounter for antineoplastic chemotherapy: Secondary | ICD-10-CM | POA: Insufficient documentation

## 2020-05-05 DIAGNOSIS — C775 Secondary and unspecified malignant neoplasm of intrapelvic lymph nodes: Secondary | ICD-10-CM | POA: Insufficient documentation

## 2020-05-05 DIAGNOSIS — Z510 Encounter for antineoplastic radiation therapy: Secondary | ICD-10-CM | POA: Diagnosis present

## 2020-05-05 DIAGNOSIS — Z95828 Presence of other vascular implants and grafts: Secondary | ICD-10-CM

## 2020-05-05 MED ORDER — SODIUM CHLORIDE 0.9% FLUSH
10.0000 mL | INTRAVENOUS | Status: DC | PRN
  Administered 2020-05-05: 10 mL via INTRAVENOUS
  Filled 2020-05-05: qty 10

## 2020-05-05 MED ORDER — HEPARIN SOD (PORK) LOCK FLUSH 100 UNIT/ML IV SOLN
INTRAVENOUS | Status: AC
  Filled 2020-05-05: qty 5

## 2020-05-05 MED ORDER — HEPARIN SOD (PORK) LOCK FLUSH 100 UNIT/ML IV SOLN
500.0000 [IU] | Freq: Once | INTRAVENOUS | Status: AC
  Administered 2020-05-05: 500 [IU] via INTRAVENOUS
  Filled 2020-05-05: qty 5

## 2020-05-06 ENCOUNTER — Ambulatory Visit: Payer: 59

## 2020-05-07 ENCOUNTER — Ambulatory Visit: Payer: 59

## 2020-05-08 ENCOUNTER — Encounter: Payer: Self-pay | Admitting: Internal Medicine

## 2020-05-08 ENCOUNTER — Inpatient Hospital Stay (HOSPITAL_BASED_OUTPATIENT_CLINIC_OR_DEPARTMENT_OTHER): Payer: 59 | Admitting: Internal Medicine

## 2020-05-08 ENCOUNTER — Other Ambulatory Visit: Payer: Self-pay

## 2020-05-08 ENCOUNTER — Telehealth: Payer: Self-pay | Admitting: *Deleted

## 2020-05-08 ENCOUNTER — Inpatient Hospital Stay: Payer: 59

## 2020-05-08 ENCOUNTER — Ambulatory Visit
Admission: RE | Admit: 2020-05-08 | Discharge: 2020-05-08 | Disposition: A | Discharge status: Home / Self Care | Payer: 59 | Source: Ambulatory Visit | Attending: Radiation Oncology | Admitting: Radiation Oncology

## 2020-05-08 DIAGNOSIS — C20 Malignant neoplasm of rectum: Secondary | ICD-10-CM

## 2020-05-08 DIAGNOSIS — Z510 Encounter for antineoplastic radiation therapy: Secondary | ICD-10-CM | POA: Diagnosis not present

## 2020-05-08 DIAGNOSIS — Z5111 Encounter for antineoplastic chemotherapy: Secondary | ICD-10-CM | POA: Diagnosis not present

## 2020-05-08 LAB — CBC WITH DIFFERENTIAL/PLATELET
Abs Immature Granulocytes: 0.02 10*3/uL (ref 0.00–0.07)
Basophils Absolute: 0 10*3/uL (ref 0.0–0.1)
Basophils Relative: 1 %
Eosinophils Absolute: 0.1 10*3/uL (ref 0.0–0.5)
Eosinophils Relative: 1 %
HCT: 35.9 % — ABNORMAL LOW (ref 39.0–52.0)
Hemoglobin: 12 g/dL — ABNORMAL LOW (ref 13.0–17.0)
Immature Granulocytes: 0 %
Lymphocytes Relative: 12 %
Lymphs Abs: 0.6 10*3/uL — ABNORMAL LOW (ref 0.7–4.0)
MCH: 30.2 pg (ref 26.0–34.0)
MCHC: 33.4 g/dL (ref 30.0–36.0)
MCV: 90.4 fL (ref 80.0–100.0)
Monocytes Absolute: 0.4 10*3/uL (ref 0.1–1.0)
Monocytes Relative: 8 %
Neutro Abs: 4.1 10*3/uL (ref 1.7–7.7)
Neutrophils Relative %: 78 %
Platelets: 150 10*3/uL (ref 150–400)
RBC: 3.97 MIL/uL — ABNORMAL LOW (ref 4.22–5.81)
RDW: 16.3 % — ABNORMAL HIGH (ref 11.5–15.5)
WBC: 5.2 10*3/uL (ref 4.0–10.5)
nRBC: 0 % (ref 0.0–0.2)

## 2020-05-08 LAB — COMPREHENSIVE METABOLIC PANEL
ALT: 28 U/L (ref 0–44)
AST: 35 U/L (ref 15–41)
Albumin: 3.8 g/dL (ref 3.5–5.0)
Alkaline Phosphatase: 141 U/L — ABNORMAL HIGH (ref 38–126)
Anion gap: 6 (ref 5–15)
BUN: 21 mg/dL — ABNORMAL HIGH (ref 6–20)
CO2: 25 mmol/L (ref 22–32)
Calcium: 8.9 mg/dL (ref 8.9–10.3)
Chloride: 106 mmol/L (ref 98–111)
Creatinine, Ser: 0.94 mg/dL (ref 0.61–1.24)
GFR calc Af Amer: >60 mL/min (ref >60)
GFR calc non Af Amer: >60 mL/min (ref >60)
Glucose, Bld: 105 mg/dL — ABNORMAL HIGH (ref 70–99)
Potassium: 4 mmol/L (ref 3.5–5.1)
Sodium: 137 mmol/L (ref 135–145)
Total Bilirubin: 0.7 mg/dL (ref 0.3–1.2)
Total Protein: 8.2 g/dL — ABNORMAL HIGH (ref 6.5–8.1)

## 2020-05-08 MED ORDER — SODIUM CHLORIDE 0.9 % IV SOLN
225.0000 mg/m2/d | INTRAVENOUS | Status: DC
  Administered 2020-05-08: 2800 mg via INTRAVENOUS
  Filled 2020-05-08: qty 56

## 2020-05-08 MED ORDER — SODIUM CHLORIDE 0.9% FLUSH
10.0000 mL | INTRAVENOUS | Status: DC | PRN
  Administered 2020-05-08: 10 mL via INTRAVENOUS
  Filled 2020-05-08: qty 10

## 2020-05-08 MED ORDER — LIDOCAINE-PRILOCAINE 2.5-2.5 % EX CREA: 1.0000 "application " | TOPICAL_CREAM | CUTANEOUS | 3 refills | Status: AC | PRN

## 2020-05-08 NOTE — Telephone Encounter (Signed)
Contacted patient. His FMLA paperwork has been completed.

## 2020-05-08 NOTE — Assessment & Plan Note (Addendum)
#  Rectal adenocarcinoma- moderate differentiated; stage III [multiple pelvic lymph nodes]; question borderline 9 mm retroperitoneal neuropathy. PET-bulky rectal mass; multiple lymph nodes noted; no uptake in the retroperitoneal lymph nodes; MRI rectum- T3N2M0;   Pretreatment CEA 94. ON TNT [total neoadjuvant therapy]; currently on FOLFOX chemotherapy. CEA- pre-treatment- ~100; CEA- 4.2; STABLE. s/p cycle #8 of FOLFOX chemotherapy.  # Proceed with  5FU- CIV M-F; RT [ 9/20-10/25]  Labs today reviewed;  acceptable for treatment today.  S/p evaluation Dr. Audie Clear; planning surgery early Jan 2022.  We will plan to have imaging done approximately 1 month post chemoradiation/late November.  # Rising alkaline phosphatase-today -141- likely from chemotherapy- STABLE; monitor closely.   # PN-cold sensistivity-G-1- STABLE.   # Iron deficient anemia/chronic GI bleed-Hb13.2; HOLD IV iron today  # Peri-rectal pain- STABLE; Vicodin prn.   #Leave from work-secondary to ongoing chemotherapy radiation/fatigue.  However patient interested going back to work starting November 1st.  Paperwork given.  DISPOSITION:  # chemo today; pump off 10/08 # 1 week- labs- cbc/cmp; 5FU pump; DC pump on October 15th #in 2 weeks- labs- cbc/CMP-5FU pump; DC pump on oct 22nd-Dr.B

## 2020-05-08 NOTE — Progress Notes (Signed)
[facility] CONSULT NOTE  Patient Care Team: Volney American, PA-C as PCP - General (Family Medicine) Clent Jacks, RN as Oncology Nurse Navigator [name], MD as Consulting Physician (Gastroenterology) Cammie Sickle, MD as Consulting Physician (Internal Medicine) Cammie Sickle, MD as Consulting Physician (Hematology and Oncology)  CHIEF COMPLAINTS/PURPOSE OF CONSULTATION: Rectal cancer  #  Oncology History Overview Note  # MAY 2021-rectal adenocarcinoma ; moderately differentiated; [Dr.Anna]; colonoscopy-partially obstructing circumferential rectal mass-starting at anal verge to proximal. CEA- 66. CT C/A/P- Large mass involving the rectum is identified compatible with primary rectal carcinoma. This has a large, 6.7 cm exophytic component in the left pelvis. Multiple perirectal lymph nodes are identified compatible with metastatic adenopathy; Borderline enlarged aortocaval node measures 0.9 cm; PET-bulky rectal mass; multiple lymph nodes noted; no uptake in the retroperitoneal lymph nodes; MRI rectum- T3N2M0;   Pretreatment CEA 94; TNT  # June 1st 2021- FOLFOX  # SURVIVORSHIP:   # GENETICS:   DIAGNOSIS: RECTAL CA  STAGE:   III     ;  GOALS: cure  CURRENT/MOST RECENT THERAPY : FOLFOX [C]   Rectal cancer ([place])  12/20/2019 Initial Diagnosis   Rectal cancer ([place])   01/04/2020 - 04/11/2020 Chemotherapy   The patient had dexamethasone (DECADRON) 4 MG tablet, 8 mg, Oral, Daily, 1 of 1 cycle, Start date: --, End date: -- palonosetron (ALOXI) injection 0.25 mg, 0.25 mg, Intravenous,  Once, 8 of 8 cycles Administration: 0.25 mg (01/04/2020), 0.25 mg (01/17/2020), 0.25 mg (01/31/2020), 0.25 mg (02/14/2020), 0.25 mg (02/28/2020), 0.25 mg (03/13/2020), 0.25 mg (04/11/2020) pegfilgrastim (NEULASTA ONPRO KIT) injection 6 mg, 6 mg, Subcutaneous, Once, 2 of 2 cycles Administration: 6 mg (02/02/2020) leucovorin 1,000 mg in dextrose 5 % 250 mL infusion, 1,008 mg,  Intravenous,  Once, 8 of 8 cycles Administration: 1,000 mg (01/04/2020), 1,000 mg (01/17/2020), 1,000 mg (01/31/2020), 1,000 mg (02/14/2020), 1,000 mg (02/28/2020), 1,000 mg (03/13/2020), 1,000 mg (04/11/2020) oxaliplatin (ELOXATIN) 200 mg in dextrose 5 % 500 mL chemo infusion, 215 mg, Intravenous,  Once, 8 of 8 cycles Administration: 200 mg (01/04/2020), 200 mg (01/17/2020), 200 mg (01/31/2020), 200 mg (02/14/2020), 200 mg (02/28/2020), 200 mg (03/13/2020), 200 mg (04/11/2020) fluorouracil (ADRUCIL) chemo injection 1,000 mg, 400 mg/m2 = 1,000 mg, Intravenous,  Once, 2 of 2 cycles Administration: 1,000 mg (01/04/2020), 1,000 mg (01/17/2020) fluorouracil (ADRUCIL) 6,050 mg in sodium chloride 0.9 % 129 mL chemo infusion, 2,400 mg/m2 = 6,050 mg, Intravenous, 1 Day/Dose, 8 of 8 cycles Administration: 6,050 mg (01/04/2020), 6,050 mg (01/17/2020), 6,050 mg (01/31/2020), 6,050 mg (02/14/2020), 6,050 mg (02/28/2020), 6,050 mg (03/13/2020), 6,050 mg (04/11/2020)  for chemotherapy treatment.    04/24/2020 -  Chemotherapy   The patient had fluorouracil (ADRUCIL) 2,800 mg in sodium chloride 0.9 % 94 mL chemo infusion, 225 mg/m2/day = 2,800 mg, Intravenous, 5D (120 hours), 3 of 5 cycles Administration: 2,800 mg (04/24/2020), 2,800 mg (05/01/2020), 2,800 mg (05/08/2020)  for chemotherapy treatment.       HISTORY OF PRESENTING ILLNESS:  Stephen Vasquez 59 y.o.  male  With stage III rectal cancer currently on TNT-FOLFOX chemotherapy is here for follow-up.  In the interim evaluated by surgery at [facility] Dr. Audie Clear.   Patient currently getting 5-FU radiation-tolerating well no nausea vomiting.  Complains of fatigue.  No swelling of the legs.  No weight loss.  Mild tingling and numbness.  Review of Systems  Constitutional: Positive for malaise/fatigue. Negative for chills, diaphoresis, fever and weight loss.  HENT: Negative for nosebleeds and  sore throat.   Eyes: Negative for double vision.  Respiratory: Negative for cough, hemoptysis, sputum  production, shortness of breath and wheezing.   Cardiovascular: Negative for chest pain, palpitations, orthopnea and leg swelling.  Gastrointestinal: Negative for abdominal pain, constipation, diarrhea, heartburn, melena, nausea and vomiting.  Genitourinary: Negative for dysuria, frequency and urgency.  Musculoskeletal: Positive for joint pain. Negative for back pain.  Skin: Negative.  Negative for itching and rash.  Neurological: Positive for tingling. Negative for dizziness, focal weakness, weakness and headaches.  Endo/Heme/Allergies: Does not bruise/bleed easily.  Psychiatric/Behavioral: Negative for depression. The patient is not nervous/anxious and does not have insomnia.      MEDICAL HISTORY:  Past Medical History:  Diagnosis Date  . Allergic rhinitis   . Heart murmur   . Rectal cancer [facility])     SURGICAL HISTORY: Past Surgical History:  Procedure Laterality Date  . COLONOSCOPY WITH PROPOFOL N/A 12/15/2019   Procedure: COLONOSCOPY WITH PROPOFOL;  Surgeon: [name], MD;  Location: [facility] ENDOSCOPY;  Service: Gastroenterology;  Laterality: N/A;  . PORTACATH PLACEMENT Left 12/29/2019   Procedure: INSERTION PORT-A-CATH;  Surgeon: Robert Bellow, MD;  Location: [facility] ORS;  Service: General;  Laterality: Left;    SOCIAL HISTORY: Social History   Socioeconomic History  . Marital status: Married    Spouse name: Not on file  . Number of children: Not on file  . Years of education: Not on file  . Highest education level: Not on file  Occupational History  . Not on file  Tobacco Use  . Smoking status: Former Research scientist (life sciences)  . Smokeless tobacco: Never Used  Vaping Use  . Vaping Use: Never used  Substance and Sexual Activity  . Alcohol use: Not Currently  . Drug use: Never  . Sexual activity: Yes  Other Topics Concern  . 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 2 children- in boy and girls in late  38s.    Social Determinants of Health   Financial Resource Strain:   . Difficulty of Paying Living Expenses: Not on file  Food Insecurity:   . Worried About Charity fundraiser in the Last Year: Not on file  . Ran Out of Food in the Last Year: Not on file  Transportation Needs:   . Lack of Transportation (Medical): Not on file  . Lack of Transportation (Non-Medical): Not on file  Physical Activity:   . Days of Exercise per Week: Not on file  . Minutes of Exercise per Session: Not on file  Stress:   . Feeling of Stress : Not on file  Social Connections:   . Frequency of Communication with Friends and Family: Not on file  . Frequency of Social Gatherings with Friends and Family: Not on file  . Attends Religious Services: Not on file  . Active Member of Clubs or Organizations: Not on file  . Attends Archivist Meetings: Not on file  . Marital Status: Not on file  Intimate Partner Violence:   . Fear of Current or Ex-Partner: Not on file  . Emotionally Abused: Not on file  . Physically Abused: Not on file  . Sexually Abused: Not on file    FAMILY HISTORY: Family History  Problem Relation Age of Onset  . Hypertension Mother   . Hypertension Father   . Stomach cancer Father        in 53s- survived.   Marland Kitchen  Heart disease Brother     ALLERGIES:  has No Known Allergies.  MEDICATIONS:  Current Outpatient Medications  Medication Sig Dispense Refill  . azelastine (ASTELIN) 0.1 % nasal spray Place 2 sprays into both nostrils 2 (two) times daily. Use in each nostril as directed 30 mL 12  . lidocaine-prilocaine (EMLA) cream Apply 1 application topically as needed (port access). 30 g 3  . montelukast (SINGULAIR) 10 MG tablet Take 1 tablet (10 mg total) by mouth at bedtime. 30 tablet 3  . naproxen sodium (ALEVE) 220 MG tablet Take 220 mg by mouth 2 (two) times daily as needed (pain).    . ondansetron (ZOFRAN) 8 MG tablet One pill every 8 hours as needed for nausea/vomitting. 40  tablet 1  . oxymetazoline (AFRIN) 0.05 % nasal spray Place 1 spray into both nostrils 2 (two) times daily as needed for congestion.    . prochlorperazine (COMPAZINE) 10 MG tablet Take 1 tablet (10 mg total) by mouth every 6 (six) hours as needed for nausea or vomiting. 40 tablet 1  . dicyclomine (BENTYL) 10 MG capsule Take 1 capsule (10 mg total) by mouth 4 (four) times daily -  before meals and at bedtime. (Patient not taking: Reported on 03/01/2020) 120 capsule 1  . HYDROcodone-acetaminophen (NORCO/VICODIN) 5-325 MG tablet Take 1 tablet by mouth every 8 (eight) hours as needed for moderate pain. (Patient not taking: Reported on 03/28/2020) 45 tablet 0   No current facility-administered medications for this visit.      Marland Kitchen  PHYSICAL EXAMINATION: ECOG PERFORMANCE STATUS: 1 - Symptomatic but completely ambulatory  Vitals:   05/08/20 0836  BP: 113/71  Pulse: 76  Resp: 16  Temp: (!) 97.1 F (36.2 C)  SpO2: 100%   Filed Weights   05/08/20 0836  Weight: 265 lb (120.2 kg)    Physical Exam Constitutional:      Comments: He is alone.   Walk independently.  HENT:     Head: Normocephalic and atraumatic.     Mouth/Throat:     Pharynx: No oropharyngeal exudate.  Eyes:     Pupils: Pupils are equal, round, and reactive to light.  Cardiovascular:     Rate and Rhythm: Normal rate and regular rhythm.  Pulmonary:     Effort: Pulmonary effort is normal. No respiratory distress.     Breath sounds: Normal breath sounds. No wheezing.  Abdominal:     General: Bowel sounds are normal. There is no distension.     Palpations: Abdomen is soft. There is no mass.     Tenderness: There is no abdominal tenderness. There is no guarding or rebound.  Musculoskeletal:        General: No tenderness. Normal range of motion.     Cervical back: Normal range of motion and neck supple.  Skin:    General: Skin is warm.  Neurological:     Mental Status: He is alert and oriented to person, place, and time.   Psychiatric:        Mood and Affect: Affect normal.      LABORATORY DATA:  I have reviewed the data as listed Lab Results  Component Value Date   WBC 5.2 05/08/2020   HGB 12.0 (L) 05/08/2020   HCT 35.9 (L) 05/08/2020   MCV 90.4 05/08/2020   PLT 150 05/08/2020   Recent Labs    04/24/20 0831 05/01/20 0843 05/08/20 0828  NA 138 137 137  K 3.8 4.0 4.0  CL 104 107 106  CO2  25 25 25   GLUCOSE 100* 99 105*  BUN 14 21* 21*  CREATININE 1.01 1.01 0.94  CALCIUM 8.8* 8.7* 8.9  GFRNONAA >60 >60 >60  GFRAA >60 >60 >60  PROT 7.9 7.9 8.2*  ALBUMIN 3.6 3.6 3.8  AST 31 38 35  ALT 27 31 28   ALKPHOS 187* 159* 141*  BILITOT 0.6 0.5 0.7    RADIOGRAPHIC STUDIES: I have personally reviewed the radiological images as listed and agreed with the findings in the report. No results found.  ASSESSMENT & PLAN:   Rectal cancer ([place]) #Rectal adenocarcinoma- moderate differentiated; stage III [multiple pelvic lymph nodes]; question borderline 9 mm retroperitoneal neuropathy. PET-bulky rectal mass; multiple lymph nodes noted; no uptake in the retroperitoneal lymph nodes; MRI rectum- T3N2M0;   Pretreatment CEA 94. ON TNT [total neoadjuvant therapy]; currently on FOLFOX chemotherapy. CEA- pre-treatment- ~100; CEA- 4.2; STABLE. s/p cycle #8 of FOLFOX chemotherapy.  # Proceed with  5FU- CIV M-F; RT [ 9/20-10/25]  Labs today reviewed;  acceptable for treatment today.  S/p evaluation Dr. Audie Clear; planning surgery early Jan 2022.  We will plan to have imaging done approximately 1 month post chemoradiation/late November.  # Rising alkaline phosphatase-today -141- likely from chemotherapy- STABLE; monitor closely.   # PN-cold sensistivity-G-1- STABLE.   # Iron deficient anemia/chronic GI bleed-Hb13.2; HOLD IV iron today  # Peri-rectal pain- STABLE; Vicodin prn.   #Leave from work-secondary to ongoing chemotherapy radiation/fatigue.  However patient interested going back to work starting November 1st.   Paperwork given.  DISPOSITION:  # chemo today; pump off 10/08 # 1 week- labs- cbc/cmp; 5FU pump; DC pump on October 15th #in 2 weeks- labs- cbc/CMP-5FU pump; DC pump on oct 22nd-Dr.B   All questions were answered. The patient knows to call the clinic with any problems, questions or concerns.    Cammie Sickle, MD 05/08/2020 4:04 PM

## 2020-05-09 ENCOUNTER — Ambulatory Visit
Admission: RE | Admit: 2020-05-09 | Discharge: 2020-05-09 | Disposition: A | Discharge status: Home / Self Care | Payer: 59 | Source: Ambulatory Visit | Attending: Radiation Oncology | Admitting: Radiation Oncology

## 2020-05-09 DIAGNOSIS — Z510 Encounter for antineoplastic radiation therapy: Secondary | ICD-10-CM | POA: Diagnosis not present

## 2020-05-10 ENCOUNTER — Ambulatory Visit
Admission: RE | Admit: 2020-05-10 | Discharge: 2020-05-10 | Disposition: A | Discharge status: Home / Self Care | Payer: 59 | Source: Ambulatory Visit | Attending: Radiation Oncology | Admitting: Radiation Oncology

## 2020-05-10 DIAGNOSIS — Z510 Encounter for antineoplastic radiation therapy: Secondary | ICD-10-CM | POA: Diagnosis not present

## 2020-05-11 ENCOUNTER — Ambulatory Visit
Admission: RE | Admit: 2020-05-11 | Discharge: 2020-05-11 | Disposition: A | Discharge status: Home / Self Care | Payer: 59 | Source: Ambulatory Visit | Attending: Radiation Oncology | Admitting: Radiation Oncology

## 2020-05-11 DIAGNOSIS — Z510 Encounter for antineoplastic radiation therapy: Secondary | ICD-10-CM | POA: Diagnosis not present

## 2020-05-12 ENCOUNTER — Other Ambulatory Visit: Payer: Self-pay

## 2020-05-12 ENCOUNTER — Ambulatory Visit
Admission: RE | Admit: 2020-05-12 | Discharge: 2020-05-12 | Disposition: A | Discharge status: Home / Self Care | Payer: 59 | Source: Ambulatory Visit | Attending: Radiation Oncology | Admitting: Radiation Oncology

## 2020-05-12 ENCOUNTER — Inpatient Hospital Stay: Payer: 59

## 2020-05-12 VITALS — BP 135/85 | HR 73 | Temp 97.6°F

## 2020-05-12 DIAGNOSIS — Z5111 Encounter for antineoplastic chemotherapy: Secondary | ICD-10-CM | POA: Diagnosis not present

## 2020-05-12 DIAGNOSIS — Z510 Encounter for antineoplastic radiation therapy: Secondary | ICD-10-CM | POA: Diagnosis not present

## 2020-05-12 DIAGNOSIS — C20 Malignant neoplasm of rectum: Secondary | ICD-10-CM

## 2020-05-12 MED ORDER — SODIUM CHLORIDE 0.9% FLUSH
10.0000 mL | INTRAVENOUS | Status: DC | PRN
  Administered 2020-05-12: 10 mL
  Filled 2020-05-12: qty 10

## 2020-05-12 MED ORDER — HEPARIN SOD (PORK) LOCK FLUSH 100 UNIT/ML IV SOLN
500.0000 [IU] | Freq: Once | INTRAVENOUS | Status: AC | PRN
  Administered 2020-05-12: 500 [IU]
  Filled 2020-05-12: qty 5

## 2020-05-13 ENCOUNTER — Ambulatory Visit: Payer: 59

## 2020-05-14 ENCOUNTER — Ambulatory Visit: Payer: 59

## 2020-05-15 ENCOUNTER — Ambulatory Visit
Admission: RE | Admit: 2020-05-15 | Discharge: 2020-05-15 | Disposition: A | Discharge status: Home / Self Care | Payer: 59 | Source: Ambulatory Visit | Attending: Radiation Oncology | Admitting: Radiation Oncology

## 2020-05-15 ENCOUNTER — Other Ambulatory Visit: Payer: Self-pay

## 2020-05-15 ENCOUNTER — Inpatient Hospital Stay: Payer: 59

## 2020-05-15 VITALS — BP 131/79 | HR 66 | Temp 97.0°F | Resp 17 | Wt 265.3 lb

## 2020-05-15 DIAGNOSIS — C20 Malignant neoplasm of rectum: Secondary | ICD-10-CM

## 2020-05-15 DIAGNOSIS — Z510 Encounter for antineoplastic radiation therapy: Secondary | ICD-10-CM | POA: Diagnosis not present

## 2020-05-15 DIAGNOSIS — Z95828 Presence of other vascular implants and grafts: Secondary | ICD-10-CM

## 2020-05-15 DIAGNOSIS — Z5111 Encounter for antineoplastic chemotherapy: Secondary | ICD-10-CM | POA: Diagnosis not present

## 2020-05-15 LAB — COMPREHENSIVE METABOLIC PANEL
ALT: 25 U/L (ref 0–44)
AST: 32 U/L (ref 15–41)
Albumin: 3.8 g/dL (ref 3.5–5.0)
Alkaline Phosphatase: 134 U/L — ABNORMAL HIGH (ref 38–126)
Anion gap: 9 (ref 5–15)
BUN: 21 mg/dL — ABNORMAL HIGH (ref 6–20)
CO2: 23 mmol/L (ref 22–32)
Calcium: 8.7 mg/dL — ABNORMAL LOW (ref 8.9–10.3)
Chloride: 104 mmol/L (ref 98–111)
Creatinine, Ser: 0.94 mg/dL (ref 0.61–1.24)
GFR, Estimated: >60 mL/min (ref >60)
Glucose, Bld: 101 mg/dL — ABNORMAL HIGH (ref 70–99)
Potassium: 3.9 mmol/L (ref 3.5–5.1)
Sodium: 136 mmol/L (ref 135–145)
Total Bilirubin: 0.5 mg/dL (ref 0.3–1.2)
Total Protein: 7.7 g/dL (ref 6.5–8.1)

## 2020-05-15 LAB — CBC WITH DIFFERENTIAL/PLATELET
Abs Immature Granulocytes: 0.01 10*3/uL (ref 0.00–0.07)
Basophils Absolute: 0 10*3/uL (ref 0.0–0.1)
Basophils Relative: 1 %
Eosinophils Absolute: 0.1 10*3/uL (ref 0.0–0.5)
Eosinophils Relative: 2 %
HCT: 34.5 % — ABNORMAL LOW (ref 39.0–52.0)
Hemoglobin: 11.6 g/dL — ABNORMAL LOW (ref 13.0–17.0)
Immature Granulocytes: 0 %
Lymphocytes Relative: 11 %
Lymphs Abs: 0.5 10*3/uL — ABNORMAL LOW (ref 0.7–4.0)
MCH: 30.4 pg (ref 26.0–34.0)
MCHC: 33.6 g/dL (ref 30.0–36.0)
MCV: 90.6 fL (ref 80.0–100.0)
Monocytes Absolute: 0.5 10*3/uL (ref 0.1–1.0)
Monocytes Relative: 10 %
Neutro Abs: 3.6 10*3/uL (ref 1.7–7.7)
Neutrophils Relative %: 76 %
Platelets: 157 10*3/uL (ref 150–400)
RBC: 3.81 MIL/uL — ABNORMAL LOW (ref 4.22–5.81)
RDW: 15.7 % — ABNORMAL HIGH (ref 11.5–15.5)
WBC: 4.7 10*3/uL (ref 4.0–10.5)
nRBC: 0 % (ref 0.0–0.2)

## 2020-05-15 MED ORDER — HEPARIN SOD (PORK) LOCK FLUSH 100 UNIT/ML IV SOLN
500.0000 [IU] | Freq: Once | INTRAVENOUS | Status: DC
  Filled 2020-05-15: qty 5

## 2020-05-15 MED ORDER — SODIUM CHLORIDE 0.9% FLUSH
10.0000 mL | Freq: Once | INTRAVENOUS | Status: AC
  Administered 2020-05-15: 10 mL via INTRAVENOUS
  Filled 2020-05-15: qty 10

## 2020-05-15 MED ORDER — SODIUM CHLORIDE 0.9 % IV SOLN
225.0000 mg/m2/d | INTRAVENOUS | Status: DC
  Administered 2020-05-15: 2800 mg via INTRAVENOUS
  Filled 2020-05-15: qty 56

## 2020-05-15 MED ORDER — SODIUM CHLORIDE 0.9% FLUSH
10.0000 mL | INTRAVENOUS | Status: DC | PRN
  Administered 2020-05-15: 10 mL
  Filled 2020-05-15: qty 10

## 2020-05-16 ENCOUNTER — Ambulatory Visit
Admission: RE | Admit: 2020-05-16 | Discharge: 2020-05-16 | Disposition: A | Discharge status: Home / Self Care | Payer: 59 | Source: Ambulatory Visit | Attending: Radiation Oncology | Admitting: Radiation Oncology

## 2020-05-16 DIAGNOSIS — Z510 Encounter for antineoplastic radiation therapy: Secondary | ICD-10-CM | POA: Diagnosis not present

## 2020-05-17 ENCOUNTER — Ambulatory Visit
Admission: RE | Admit: 2020-05-17 | Discharge: 2020-05-17 | Disposition: A | Discharge status: Home / Self Care | Payer: 59 | Source: Ambulatory Visit | Attending: Radiation Oncology | Admitting: Radiation Oncology

## 2020-05-17 DIAGNOSIS — Z510 Encounter for antineoplastic radiation therapy: Secondary | ICD-10-CM | POA: Diagnosis not present

## 2020-05-18 ENCOUNTER — Ambulatory Visit
Admission: RE | Admit: 2020-05-18 | Discharge: 2020-05-18 | Disposition: A | Discharge status: Home / Self Care | Payer: 59 | Source: Ambulatory Visit | Attending: Radiation Oncology | Admitting: Radiation Oncology

## 2020-05-18 DIAGNOSIS — Z510 Encounter for antineoplastic radiation therapy: Secondary | ICD-10-CM | POA: Diagnosis not present

## 2020-05-19 ENCOUNTER — Inpatient Hospital Stay: Payer: 59

## 2020-05-19 ENCOUNTER — Other Ambulatory Visit: Payer: Self-pay

## 2020-05-19 ENCOUNTER — Ambulatory Visit
Admission: RE | Admit: 2020-05-19 | Discharge: 2020-05-19 | Disposition: A | Discharge status: Home / Self Care | Payer: 59 | Source: Ambulatory Visit | Attending: Radiation Oncology | Admitting: Radiation Oncology

## 2020-05-19 DIAGNOSIS — Z5111 Encounter for antineoplastic chemotherapy: Secondary | ICD-10-CM | POA: Diagnosis not present

## 2020-05-19 DIAGNOSIS — Z510 Encounter for antineoplastic radiation therapy: Secondary | ICD-10-CM | POA: Diagnosis not present

## 2020-05-19 DIAGNOSIS — C20 Malignant neoplasm of rectum: Secondary | ICD-10-CM

## 2020-05-19 MED ORDER — SODIUM CHLORIDE 0.9% FLUSH
10.0000 mL | INTRAVENOUS | Status: DC | PRN
  Administered 2020-05-19: 10 mL
  Filled 2020-05-19: qty 10

## 2020-05-19 MED ORDER — HEPARIN SOD (PORK) LOCK FLUSH 100 UNIT/ML IV SOLN
500.0000 [IU] | Freq: Once | INTRAVENOUS | Status: AC | PRN
  Administered 2020-05-19: 500 [IU]
  Filled 2020-05-19: qty 5

## 2020-05-20 ENCOUNTER — Ambulatory Visit: Payer: 59

## 2020-05-22 ENCOUNTER — Other Ambulatory Visit: Payer: Self-pay

## 2020-05-22 ENCOUNTER — Inpatient Hospital Stay (HOSPITAL_BASED_OUTPATIENT_CLINIC_OR_DEPARTMENT_OTHER): Payer: 59 | Admitting: Internal Medicine

## 2020-05-22 ENCOUNTER — Inpatient Hospital Stay: Payer: 59

## 2020-05-22 ENCOUNTER — Encounter: Payer: Self-pay | Admitting: Internal Medicine

## 2020-05-22 ENCOUNTER — Ambulatory Visit
Admission: RE | Admit: 2020-05-22 | Discharge: 2020-05-22 | Disposition: A | Discharge status: Home / Self Care | Payer: 59 | Source: Ambulatory Visit | Attending: Radiation Oncology | Admitting: Radiation Oncology

## 2020-05-22 DIAGNOSIS — Z5111 Encounter for antineoplastic chemotherapy: Secondary | ICD-10-CM | POA: Diagnosis not present

## 2020-05-22 DIAGNOSIS — C20 Malignant neoplasm of rectum: Secondary | ICD-10-CM

## 2020-05-22 DIAGNOSIS — Z510 Encounter for antineoplastic radiation therapy: Secondary | ICD-10-CM | POA: Diagnosis not present

## 2020-05-22 LAB — CBC WITH DIFFERENTIAL/PLATELET
Abs Immature Granulocytes: 0.01 10*3/uL (ref 0.00–0.07)
Basophils Absolute: 0 10*3/uL (ref 0.0–0.1)
Basophils Relative: 1 %
Eosinophils Absolute: 0.1 10*3/uL (ref 0.0–0.5)
Eosinophils Relative: 3 %
HCT: 36.1 % — ABNORMAL LOW (ref 39.0–52.0)
Hemoglobin: 12.2 g/dL — ABNORMAL LOW (ref 13.0–17.0)
Immature Granulocytes: 0 %
Lymphocytes Relative: 11 %
Lymphs Abs: 0.5 10*3/uL — ABNORMAL LOW (ref 0.7–4.0)
MCH: 30.7 pg (ref 26.0–34.0)
MCHC: 33.8 g/dL (ref 30.0–36.0)
MCV: 90.9 fL (ref 80.0–100.0)
Monocytes Absolute: 0.4 10*3/uL (ref 0.1–1.0)
Monocytes Relative: 9 %
Neutro Abs: 3.4 10*3/uL (ref 1.7–7.7)
Neutrophils Relative %: 76 %
Platelets: 200 10*3/uL (ref 150–400)
RBC: 3.97 MIL/uL — ABNORMAL LOW (ref 4.22–5.81)
RDW: 15 % (ref 11.5–15.5)
WBC: 4.4 10*3/uL (ref 4.0–10.5)
nRBC: 0 % (ref 0.0–0.2)

## 2020-05-22 LAB — COMPREHENSIVE METABOLIC PANEL
ALT: 29 U/L (ref 0–44)
AST: 34 U/L (ref 15–41)
Albumin: 3.9 g/dL (ref 3.5–5.0)
Alkaline Phosphatase: 138 U/L — ABNORMAL HIGH (ref 38–126)
Anion gap: 7 (ref 5–15)
BUN: 19 mg/dL (ref 6–20)
CO2: 24 mmol/L (ref 22–32)
Calcium: 9.1 mg/dL (ref 8.9–10.3)
Chloride: 107 mmol/L (ref 98–111)
Creatinine, Ser: 1.06 mg/dL (ref 0.61–1.24)
GFR, Estimated: >60 mL/min (ref >60)
Glucose, Bld: 107 mg/dL — ABNORMAL HIGH (ref 70–99)
Potassium: 3.8 mmol/L (ref 3.5–5.1)
Sodium: 138 mmol/L (ref 135–145)
Total Bilirubin: 0.8 mg/dL (ref 0.3–1.2)
Total Protein: 8.3 g/dL — ABNORMAL HIGH (ref 6.5–8.1)

## 2020-05-22 MED ORDER — HYDROCODONE-ACETAMINOPHEN 5-325 MG PO TABS: 1.0000 | ORAL_TABLET | Freq: Three times a day (TID) | ORAL | 0 refills | Status: DC | PRN

## 2020-05-22 MED ORDER — SODIUM CHLORIDE 0.9 % IV SOLN
225.0000 mg/m2/d | INTRAVENOUS | Status: DC
  Administered 2020-05-22: 2800 mg via INTRAVENOUS
  Filled 2020-05-22: qty 56

## 2020-05-22 MED ORDER — SODIUM CHLORIDE 0.9% FLUSH
10.0000 mL | INTRAVENOUS | Status: DC | PRN
  Administered 2020-05-22: 10 mL via INTRAVENOUS
  Filled 2020-05-22: qty 10

## 2020-05-22 NOTE — Progress Notes (Signed)
[facility] CONSULT NOTE  Patient Care Team: Volney American, PA-C as PCP - General (Family Medicine) Clent Jacks, RN as Oncology Nurse Navigator [name], MD as Consulting Physician (Gastroenterology) Cammie Sickle, MD as Consulting Physician (Internal Medicine) Cammie Sickle, MD as Consulting Physician (Hematology and Oncology)  CHIEF COMPLAINTS/PURPOSE OF CONSULTATION: Rectal cancer  #  Oncology History Overview Note  # MAY 2021-rectal adenocarcinoma ; moderately differentiated; [Dr.Anna]; colonoscopy-partially obstructing circumferential rectal mass-starting at anal verge to proximal. CEA- 54. CT C/A/P- Large mass involving the rectum is identified compatible with primary rectal carcinoma. This has a large, 6.7 cm exophytic component in the left pelvis. Multiple perirectal lymph nodes are identified compatible with metastatic adenopathy; Borderline enlarged aortocaval node measures 0.9 cm; PET-bulky rectal mass; multiple lymph nodes noted; no uptake in the retroperitoneal lymph nodes; MRI rectum- T3N2M0;   Pretreatment CEA 94; TNT  # June 1st 2021- FOLFOX  # SURVIVORSHIP:   # GENETICS:   DIAGNOSIS: RECTAL CA  STAGE:   III     ;  GOALS: cure  CURRENT/MOST RECENT THERAPY : FOLFOX [C]   Rectal cancer ([place])  12/20/2019 Initial Diagnosis   Rectal cancer ([place])   01/04/2020 - 04/11/2020 Chemotherapy   The patient had dexamethasone (DECADRON) 4 MG tablet, 8 mg, Oral, Daily, 1 of 1 cycle, Start date: --, End date: -- palonosetron (ALOXI) injection 0.25 mg, 0.25 mg, Intravenous,  Once, 8 of 8 cycles Administration: 0.25 mg (01/04/2020), 0.25 mg (01/17/2020), 0.25 mg (01/31/2020), 0.25 mg (02/14/2020), 0.25 mg (02/28/2020), 0.25 mg (03/13/2020), 0.25 mg (04/11/2020) pegfilgrastim (NEULASTA ONPRO KIT) injection 6 mg, 6 mg, Subcutaneous, Once, 2 of 2 cycles Administration: 6 mg (02/02/2020) leucovorin 1,000 mg in dextrose 5 % 250 mL infusion, 1,008 mg,  Intravenous,  Once, 8 of 8 cycles Administration: 1,000 mg (01/04/2020), 1,000 mg (01/17/2020), 1,000 mg (01/31/2020), 1,000 mg (02/14/2020), 1,000 mg (02/28/2020), 1,000 mg (03/13/2020), 1,000 mg (04/11/2020) oxaliplatin (ELOXATIN) 200 mg in dextrose 5 % 500 mL chemo infusion, 215 mg, Intravenous,  Once, 8 of 8 cycles Administration: 200 mg (01/04/2020), 200 mg (01/17/2020), 200 mg (01/31/2020), 200 mg (02/14/2020), 200 mg (02/28/2020), 200 mg (03/13/2020), 200 mg (04/11/2020) fluorouracil (ADRUCIL) chemo injection 1,000 mg, 400 mg/m2 = 1,000 mg, Intravenous,  Once, 2 of 2 cycles Administration: 1,000 mg (01/04/2020), 1,000 mg (01/17/2020) fluorouracil (ADRUCIL) 6,050 mg in sodium chloride 0.9 % 129 mL chemo infusion, 2,400 mg/m2 = 6,050 mg, Intravenous, 1 Day/Dose, 8 of 8 cycles Administration: 6,050 mg (01/04/2020), 6,050 mg (01/17/2020), 6,050 mg (01/31/2020), 6,050 mg (02/14/2020), 6,050 mg (02/28/2020), 6,050 mg (03/13/2020), 6,050 mg (04/11/2020)  for chemotherapy treatment.    04/24/2020 -  Chemotherapy   The patient had fluorouracil (ADRUCIL) 2,800 mg in sodium chloride 0.9 % 94 mL chemo infusion, 225 mg/m2/day = 2,800 mg, Intravenous, 5D (120 hours), 5 of 5 cycles Administration: 2,800 mg (04/24/2020), 2,800 mg (05/01/2020), 2,800 mg (05/08/2020), 2,800 mg (05/15/2020), 2,800 mg (05/22/2020)  for chemotherapy treatment.       HISTORY OF PRESENTING ILLNESS:  Stephen Vasquez 59 y.o.  male  With stage III rectal cancer currently on TNT-FOLFOX chemotherapy is here for follow-up.  Patient complains of rectal pain spasmodic especially with bowel movements.  Notes to have mucus and intermittent blood.  He has not been been taking any pain medication.  Denies any constipation admits to soft bowel movement.  Otherwise no nausea no vomiting.  Mild tingling numbness.  Mild fatigue.  Patient is proactively trying to lose  weight to help with his upcoming surgery post neoadjuvant therapy.  Review of Systems  Constitutional: Positive  for malaise/fatigue. Negative for chills, diaphoresis, fever and weight loss.  HENT: Negative for nosebleeds and sore throat.   Eyes: Negative for double vision.  Respiratory: Negative for cough, hemoptysis, sputum production, shortness of breath and wheezing.   Cardiovascular: Negative for chest pain, palpitations, orthopnea and leg swelling.  Gastrointestinal: Negative for abdominal pain, constipation, diarrhea, heartburn, melena, nausea and vomiting.  Genitourinary: Negative for dysuria, frequency and urgency.  Musculoskeletal: Positive for joint pain. Negative for back pain.  Skin: Negative.  Negative for itching and rash.  Neurological: Positive for tingling. Negative for dizziness, focal weakness, weakness and headaches.  Endo/Heme/Allergies: Does not bruise/bleed easily.  Psychiatric/Behavioral: Negative for depression. The patient is not nervous/anxious and does not have insomnia.      MEDICAL HISTORY:  Past Medical History:  Diagnosis Date  . Allergic rhinitis   . Heart murmur   . Rectal cancer [facility])     SURGICAL HISTORY: Past Surgical History:  Procedure Laterality Date  . COLONOSCOPY WITH PROPOFOL N/A 12/15/2019   Procedure: COLONOSCOPY WITH PROPOFOL;  Surgeon: [name], MD;  Location: [facility] ENDOSCOPY;  Service: Gastroenterology;  Laterality: N/A;  . PORTACATH PLACEMENT Left 12/29/2019   Procedure: INSERTION PORT-A-CATH;  Surgeon: Robert Bellow, MD;  Location: [facility] ORS;  Service: General;  Laterality: Left;    SOCIAL HISTORY: Social History   Socioeconomic History  . Marital status: Married    Spouse name: Not on file  . Number of children: Not on file  . Years of education: Not on file  . Highest education level: Not on file  Occupational History  . Not on file  Tobacco Use  . Smoking status: Former Research scientist (life sciences)  . Smokeless tobacco: Never Used  Vaping Use  . Vaping Use: Never used  Substance and Sexual Activity  . Alcohol use: Not Currently  . Drug use:  Never  . Sexual activity: Yes  Other Topics Concern  . Not on file  Social History Narrative   Lives in [place]; Freight forwarder at [employer]; 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 2 children- in boy and girls in late 21s.    Social Determinants of Health   Financial Resource Strain:   . Difficulty of Paying Living Expenses: Not on file  Food Insecurity:   . Worried About Charity fundraiser in the Last Year: Not on file  . Ran Out of Food in the Last Year: Not on file  Transportation Needs:   . Lack of Transportation (Medical): Not on file  . Lack of Transportation (Non-Medical): Not on file  Physical Activity:   . Days of Exercise per Week: Not on file  . Minutes of Exercise per Session: Not on file  Stress:   . Feeling of Stress : Not on file  Social Connections:   . Frequency of Communication with Friends and Family: Not on file  . Frequency of Social Gatherings with Friends and Family: Not on file  . Attends Religious Services: Not on file  . Active Member of Clubs or Organizations: Not on file  . Attends Archivist Meetings: Not on file  . Marital Status: Not on file  Intimate Partner Violence:   . Fear of Current or Ex-Partner: Not on file  . Emotionally Abused: Not on file  . Physically Abused: Not on file  . Sexually Abused: Not on file    FAMILY HISTORY: Family History  Problem Relation Age of Onset  . Hypertension Mother   . Hypertension Father   . Stomach cancer Father        in 51s- survived.   Marland Kitchen Heart disease Brother     ALLERGIES:  has No Known Allergies.  MEDICATIONS:  Current Outpatient Medications  Medication Sig Dispense Refill  . Acetaminophen (TYLENOL EXTRA STRENGTH PO) Take 1 tablet by mouth every 8 (eight) hours as needed (pain).    Marland Kitchen azelastine (ASTELIN) 0.1 % nasal spray Place 2 sprays into both nostrils 2 (two) times daily. Use in each nostril as directed 30 mL 12  . lidocaine-prilocaine (EMLA) cream Apply 1 application topically as  needed (port access). 30 g 3  . montelukast (SINGULAIR) 10 MG tablet Take 1 tablet (10 mg total) by mouth at bedtime. 30 tablet 3  . oxymetazoline (AFRIN) 0.05 % nasal spray Place 1 spray into both nostrils 2 (two) times daily as needed for congestion.    . dicyclomine (BENTYL) 10 MG capsule Take 1 capsule (10 mg total) by mouth 4 (four) times daily -  before meals and at bedtime. (Patient not taking: Reported on 03/01/2020) 120 capsule 1  . HYDROcodone-acetaminophen (NORCO/VICODIN) 5-325 MG tablet Take 1 tablet by mouth every 8 (eight) hours as needed for moderate pain. 30 tablet 0  . ondansetron (ZOFRAN) 8 MG tablet One pill every 8 hours as needed for nausea/vomitting. (Patient not taking: Reported on 05/22/2020) 40 tablet 1  . prochlorperazine (COMPAZINE) 10 MG tablet Take 1 tablet (10 mg total) by mouth every 6 (six) hours as needed for nausea or vomiting. (Patient not taking: Reported on 05/22/2020) 40 tablet 1   No current facility-administered medications for this visit.   Facility-Administered Medications Ordered in Other Visits  Medication Dose Route Frequency Provider Last Rate Last Admin  . fluorouracil (ADRUCIL) 2,800 mg in sodium chloride 0.9 % 94 mL chemo infusion  225 mg/m2/day (Treatment Plan Recorded) Intravenous 5 days Cammie Sickle, MD   2,800 mg at 05/22/20 1020  . sodium chloride flush (NS) 0.9 % injection 10 mL  10 mL Intravenous PRN Cammie Sickle, MD   10 mL at 05/22/20 0838      .  PHYSICAL EXAMINATION: ECOG PERFORMANCE STATUS: 1 - Symptomatic but completely ambulatory  Vitals:   05/22/20 0900  BP: 118/76  Pulse: 68  Resp: 18  Temp: (!) 96.8 F (36 C)   Filed Weights   05/22/20 0900  Weight: 265 lb (120.2 kg)    Physical Exam Constitutional:      Comments: He is alone.   Walk independently.  HENT:     Head: Normocephalic and atraumatic.     Mouth/Throat:     Pharynx: No oropharyngeal exudate.  Eyes:     Pupils: Pupils are equal,  round, and reactive to light.  Cardiovascular:     Rate and Rhythm: Normal rate and regular rhythm.  Pulmonary:     Effort: Pulmonary effort is normal. No respiratory distress.     Breath sounds: Normal breath sounds. No wheezing.  Abdominal:     General: Bowel sounds are normal. There is no distension.     Palpations: Abdomen is soft. There is no mass.     Tenderness: There is no abdominal tenderness. There is no guarding or rebound.  Musculoskeletal:        General: No tenderness. Normal range of motion.     Cervical back: Normal range of motion and neck supple.  Skin:  General: Skin is warm.  Neurological:     Mental Status: He is alert and oriented to person, place, and time.  Psychiatric:        Mood and Affect: Affect normal.      LABORATORY DATA:  I have reviewed the data as listed Lab Results  Component Value Date   WBC 4.4 05/22/2020   HGB 12.2 (L) 05/22/2020   HCT 36.1 (L) 05/22/2020   MCV 90.9 05/22/2020   PLT 200 05/22/2020   Recent Labs    04/24/20 0831 04/24/20 0831 05/01/20 0843 05/01/20 0843 05/08/20 0828 05/15/20 0835 05/22/20 0845  NA 138   < > 137   < > 137 136 138  K 3.8   < > 4.0   < > 4.0 3.9 3.8  CL 104   < > 107   < > 106 104 107  CO2 25   < > 25   < > 25 23 24   GLUCOSE 100*   < > 99   < > 105* 101* 107*  BUN 14   < > 21*   < > 21* 21* 19  CREATININE 1.01   < > 1.01   < > 0.94 0.94 1.06  CALCIUM 8.8*   < > 8.7*   < > 8.9 8.7* 9.1  GFRNONAA >60   < > >60   < > >60 >60 >60  GFRAA >60  --  >60  --  >60  --   --   PROT 7.9   < > 7.9   < > 8.2* 7.7 8.3*  ALBUMIN 3.6   < > 3.6   < > 3.8 3.8 3.9  AST 31   < > 38   < > 35 32 34  ALT 27   < > 31   < > 28 25 29   ALKPHOS 187*   < > 159*   < > 141* 134* 138*  BILITOT 0.6   < > 0.5   < > 0.7 0.5 0.8   < > = values in this interval not displayed.    RADIOGRAPHIC STUDIES: I have personally reviewed the radiological images as listed and agreed with the findings in the report. No results  found.  ASSESSMENT & PLAN:   Rectal cancer ([place]) #Rectal adenocarcinoma- moderate differentiated; stage III [multiple pelvic lymph nodes]; question borderline 9 mm retroperitoneal neuropathy. PET-bulky rectal mass; multiple lymph nodes noted; no uptake in the retroperitoneal lymph nodes; MRI rectum- T3N2M0;   Pretreatment CEA 94. ON TNT [total neoadjuvant therapy]; currently s/p FOLFOX chemotherapy. CEA- pre-treatment- ~100; CEA- 4.2; STABLE  # Proceed with  5FU- CIV M-F; RT [ 9/20-10/25]  Labs today reviewed;  acceptable for treatment today.  S/p evaluation Dr. Audie Clear; planning surgery early Jan 2022.  We will plan to have imaging done approximately 4-6 weeks post chemoradiation/late November. Will order imaging at next visit.   # Rising alkaline phosphatase-today -141- likely from chemotherapy- STABLE; monitor closely.   # Rectal pain/spasm/intermittent bleeding [sec to RT]-   # PN-cold sensistivity-G-1- STABLE.   # Iron deficient anemia/chronic GI bleed-Hb12; HOLD IV iron today.   DISPOSITION:  # chemo today; pump off 10/22 #in 2 weeks- labs- cbc/CMP-Dr.B   All questions were answered. The patient knows to call the clinic with any problems, questions or concerns.    Cammie Sickle, MD 05/22/2020 11:09 AM

## 2020-05-22 NOTE — Assessment & Plan Note (Addendum)
#  Rectal adenocarcinoma- moderate differentiated; stage III [multiple pelvic lymph nodes]; question borderline 9 mm retroperitoneal neuropathy. PET-bulky rectal mass; multiple lymph nodes noted; no uptake in the retroperitoneal lymph nodes; MRI rectum- T3N2M0;   Pretreatment CEA 94. ON TNT [total neoadjuvant therapy]; currently s/p FOLFOX chemotherapy. CEA- pre-treatment- ~100; CEA- 4.2; STABLE  # Proceed with  5FU- CIV M-F; RT [ 9/20-10/25]  Labs today reviewed;  acceptable for treatment today.  S/p evaluation Dr. Audie Clear; planning surgery early Jan 2022.  We will plan to have imaging done approximately 4-6 weeks post chemoradiation/late November. Will order imaging at next visit.   # Rising alkaline phosphatase-today -141- likely from chemotherapy- STABLE; monitor closely.   # Rectal pain/spasm/intermittent bleeding [sec to RT]-   # PN-cold sensistivity-G-1- STABLE.   # Iron deficient anemia/chronic GI bleed-Hb12; HOLD IV iron today.   DISPOSITION:  # chemo today; pump off 10/22 #in 2 weeks- labs- cbc/CMP-Dr.B

## 2020-05-23 ENCOUNTER — Ambulatory Visit
Admission: RE | Admit: 2020-05-23 | Discharge: 2020-05-23 | Disposition: A | Discharge status: Home / Self Care | Payer: 59 | Source: Ambulatory Visit | Attending: Radiation Oncology | Admitting: Radiation Oncology

## 2020-05-23 DIAGNOSIS — Z510 Encounter for antineoplastic radiation therapy: Secondary | ICD-10-CM | POA: Diagnosis not present

## 2020-05-24 ENCOUNTER — Ambulatory Visit: Payer: 59

## 2020-05-25 ENCOUNTER — Ambulatory Visit
Admission: RE | Admit: 2020-05-25 | Discharge: 2020-05-25 | Disposition: A | Discharge status: Home / Self Care | Payer: 59 | Source: Ambulatory Visit | Attending: Radiation Oncology | Admitting: Radiation Oncology

## 2020-05-25 DIAGNOSIS — Z510 Encounter for antineoplastic radiation therapy: Secondary | ICD-10-CM | POA: Diagnosis not present

## 2020-05-26 ENCOUNTER — Other Ambulatory Visit: Payer: Self-pay

## 2020-05-26 ENCOUNTER — Ambulatory Visit
Admission: RE | Admit: 2020-05-26 | Discharge: 2020-05-26 | Disposition: A | Discharge status: Home / Self Care | Payer: 59 | Source: Ambulatory Visit | Attending: Radiation Oncology | Admitting: Radiation Oncology

## 2020-05-26 ENCOUNTER — Inpatient Hospital Stay: Payer: 59

## 2020-05-26 DIAGNOSIS — C20 Malignant neoplasm of rectum: Secondary | ICD-10-CM

## 2020-05-26 DIAGNOSIS — Z510 Encounter for antineoplastic radiation therapy: Secondary | ICD-10-CM | POA: Diagnosis not present

## 2020-05-26 DIAGNOSIS — Z5111 Encounter for antineoplastic chemotherapy: Secondary | ICD-10-CM | POA: Diagnosis not present

## 2020-05-26 MED ORDER — SODIUM CHLORIDE 0.9% FLUSH
10.0000 mL | INTRAVENOUS | Status: DC | PRN
  Administered 2020-05-26: 10 mL
  Filled 2020-05-26: qty 10

## 2020-05-26 MED ORDER — HEPARIN SOD (PORK) LOCK FLUSH 100 UNIT/ML IV SOLN
500.0000 [IU] | Freq: Once | INTRAVENOUS | Status: AC | PRN
  Administered 2020-05-26: 500 [IU]
  Filled 2020-05-26: qty 5

## 2020-05-29 ENCOUNTER — Ambulatory Visit: Payer: 59

## 2020-05-29 ENCOUNTER — Ambulatory Visit
Admission: RE | Admit: 2020-05-29 | Discharge: 2020-05-29 | Disposition: A | Discharge status: Home / Self Care | Payer: 59 | Source: Ambulatory Visit | Attending: Radiation Oncology | Admitting: Radiation Oncology

## 2020-05-29 DIAGNOSIS — Z510 Encounter for antineoplastic radiation therapy: Secondary | ICD-10-CM | POA: Diagnosis not present

## 2020-05-30 ENCOUNTER — Ambulatory Visit
Admission: RE | Admit: 2020-05-30 | Discharge: 2020-05-30 | Disposition: A | Discharge status: Home / Self Care | Payer: 59 | Source: Ambulatory Visit | Attending: Radiation Oncology | Admitting: Radiation Oncology

## 2020-05-30 ENCOUNTER — Ambulatory Visit: Payer: 59

## 2020-05-30 DIAGNOSIS — Z510 Encounter for antineoplastic radiation therapy: Secondary | ICD-10-CM | POA: Diagnosis not present

## 2020-05-31 ENCOUNTER — Other Ambulatory Visit: Payer: Self-pay

## 2020-05-31 ENCOUNTER — Ambulatory Visit: Payer: 59

## 2020-05-31 ENCOUNTER — Telehealth: Payer: Self-pay

## 2020-05-31 NOTE — Telephone Encounter (Signed)
Tried calling pt about work not needed for 11/1. LVM to call back for Dr. B to speak with him.

## 2020-06-01 ENCOUNTER — Telehealth: Payer: Self-pay | Admitting: Internal Medicine

## 2020-06-01 ENCOUNTER — Ambulatory Visit: Payer: 59

## 2020-06-01 NOTE — Telephone Encounter (Signed)
On 10/28- spoke to pt re: his need for return to work note; with restriction; no more than 10 pounds lifting weights. Letter signed. GB

## 2020-06-01 NOTE — Telephone Encounter (Signed)
Note written and pt is aware to pick up.

## 2020-06-02 ENCOUNTER — Ambulatory Visit: Payer: 59

## 2020-06-05 ENCOUNTER — Inpatient Hospital Stay: Payer: 59

## 2020-06-05 ENCOUNTER — Ambulatory Visit: Payer: 59

## 2020-06-05 ENCOUNTER — Inpatient Hospital Stay: Payer: 59 | Admitting: Internal Medicine

## 2020-06-07 ENCOUNTER — Other Ambulatory Visit: Payer: Self-pay

## 2020-06-07 ENCOUNTER — Inpatient Hospital Stay (HOSPITAL_BASED_OUTPATIENT_CLINIC_OR_DEPARTMENT_OTHER): Payer: 59 | Admitting: Internal Medicine

## 2020-06-07 ENCOUNTER — Inpatient Hospital Stay: Payer: 59 | Attending: Internal Medicine

## 2020-06-07 DIAGNOSIS — Z87891 Personal history of nicotine dependence: Secondary | ICD-10-CM | POA: Insufficient documentation

## 2020-06-07 DIAGNOSIS — G620 Drug-induced polyneuropathy: Secondary | ICD-10-CM | POA: Insufficient documentation

## 2020-06-07 DIAGNOSIS — K6289 Other specified diseases of anus and rectum: Secondary | ICD-10-CM | POA: Insufficient documentation

## 2020-06-07 DIAGNOSIS — C20 Malignant neoplasm of rectum: Secondary | ICD-10-CM

## 2020-06-07 LAB — COMPREHENSIVE METABOLIC PANEL
ALT: 19 U/L (ref 0–44)
AST: 24 U/L (ref 15–41)
Albumin: 4.1 g/dL (ref 3.5–5.0)
Alkaline Phosphatase: 114 U/L (ref 38–126)
Anion gap: 7 (ref 5–15)
BUN: 18 mg/dL (ref 6–20)
CO2: 27 mmol/L (ref 22–32)
Calcium: 9.3 mg/dL (ref 8.9–10.3)
Chloride: 107 mmol/L (ref 98–111)
Creatinine, Ser: 1.03 mg/dL (ref 0.61–1.24)
GFR, Estimated: >60 mL/min (ref >60)
Glucose, Bld: 107 mg/dL — ABNORMAL HIGH (ref 70–99)
Potassium: 4.1 mmol/L (ref 3.5–5.1)
Sodium: 141 mmol/L (ref 135–145)
Total Bilirubin: 0.6 mg/dL (ref 0.3–1.2)
Total Protein: 8.1 g/dL (ref 6.5–8.1)

## 2020-06-07 LAB — CBC WITH DIFFERENTIAL/PLATELET
Abs Immature Granulocytes: 0.01 10*3/uL (ref 0.00–0.07)
Basophils Absolute: 0 10*3/uL (ref 0.0–0.1)
Basophils Relative: 1 %
Eosinophils Absolute: 0.1 10*3/uL (ref 0.0–0.5)
Eosinophils Relative: 2 %
HCT: 35.9 % — ABNORMAL LOW (ref 39.0–52.0)
Hemoglobin: 12 g/dL — ABNORMAL LOW (ref 13.0–17.0)
Immature Granulocytes: 0 %
Lymphocytes Relative: 14 %
Lymphs Abs: 0.6 10*3/uL — ABNORMAL LOW (ref 0.7–4.0)
MCH: 31.7 pg (ref 26.0–34.0)
MCHC: 33.4 g/dL (ref 30.0–36.0)
MCV: 95 fL (ref 80.0–100.0)
Monocytes Absolute: 0.4 10*3/uL (ref 0.1–1.0)
Monocytes Relative: 11 %
Neutro Abs: 3 10*3/uL (ref 1.7–7.7)
Neutrophils Relative %: 72 %
Platelets: 197 10*3/uL (ref 150–400)
RBC: 3.78 MIL/uL — ABNORMAL LOW (ref 4.22–5.81)
RDW: 14.9 % (ref 11.5–15.5)
WBC: 4.1 10*3/uL (ref 4.0–10.5)
nRBC: 0 % (ref 0.0–0.2)

## 2020-06-07 NOTE — Progress Notes (Signed)
[facility] CONSULT NOTE  Patient Care Team: Volney American, PA-C as PCP - General (Family Medicine) Clent Jacks, RN as Oncology Nurse Navigator [name], MD as Consulting Physician (Gastroenterology) Cammie Sickle, MD as Consulting Physician (Internal Medicine) Cammie Sickle, MD as Consulting Physician (Hematology and Oncology)  CHIEF COMPLAINTS/PURPOSE OF CONSULTATION: Rectal cancer  #  Oncology History Overview Note  # MAY 2021-rectal adenocarcinoma ; moderately differentiated; [Dr.Anna]; colonoscopy-partially obstructing circumferential rectal mass-starting at anal verge to proximal. CEA- 25. CT C/A/P- Large mass involving the rectum is identified compatible with primary rectal carcinoma. This has a large, 6.7 cm exophytic component in the left pelvis. Multiple perirectal lymph nodes are identified compatible with metastatic adenopathy; Borderline enlarged aortocaval node measures 0.9 cm; PET-bulky rectal mass; multiple lymph nodes noted; no uptake in the retroperitoneal lymph nodes; MRI rectum- T3N2M0;   Pretreatment CEA 94; TNT  # June 1st 2021- FOLFOX  # SURVIVORSHIP:   # GENETICS:   DIAGNOSIS: RECTAL CA  STAGE:   III     ;  GOALS: cure  CURRENT/MOST RECENT THERAPY : FOLFOX [C]   Rectal cancer ([place])  12/20/2019 Initial Diagnosis   Rectal cancer ([place])   01/04/2020 - 04/11/2020 Chemotherapy   The patient had dexamethasone (DECADRON) 4 MG tablet, 8 mg, Oral, Daily, 1 of 1 cycle, Start date: --, End date: -- palonosetron (ALOXI) injection 0.25 mg, 0.25 mg, Intravenous,  Once, 8 of 8 cycles Administration: 0.25 mg (01/04/2020), 0.25 mg (01/17/2020), 0.25 mg (01/31/2020), 0.25 mg (02/14/2020), 0.25 mg (02/28/2020), 0.25 mg (03/13/2020), 0.25 mg (04/11/2020) pegfilgrastim (NEULASTA ONPRO KIT) injection 6 mg, 6 mg, Subcutaneous, Once, 2 of 2 cycles Administration: 6 mg (02/02/2020) leucovorin 1,000 mg in dextrose 5 % 250 mL infusion, 1,008 mg,  Intravenous,  Once, 8 of 8 cycles Administration: 1,000 mg (01/04/2020), 1,000 mg (01/17/2020), 1,000 mg (01/31/2020), 1,000 mg (02/14/2020), 1,000 mg (02/28/2020), 1,000 mg (03/13/2020), 1,000 mg (04/11/2020) oxaliplatin (ELOXATIN) 200 mg in dextrose 5 % 500 mL chemo infusion, 215 mg, Intravenous,  Once, 8 of 8 cycles Administration: 200 mg (01/04/2020), 200 mg (01/17/2020), 200 mg (01/31/2020), 200 mg (02/14/2020), 200 mg (02/28/2020), 200 mg (03/13/2020), 200 mg (04/11/2020) fluorouracil (ADRUCIL) chemo injection 1,000 mg, 400 mg/m2 = 1,000 mg, Intravenous,  Once, 2 of 2 cycles Administration: 1,000 mg (01/04/2020), 1,000 mg (01/17/2020) fluorouracil (ADRUCIL) 6,050 mg in sodium chloride 0.9 % 129 mL chemo infusion, 2,400 mg/m2 = 6,050 mg, Intravenous, 1 Day/Dose, 8 of 8 cycles Administration: 6,050 mg (01/04/2020), 6,050 mg (01/17/2020), 6,050 mg (01/31/2020), 6,050 mg (02/14/2020), 6,050 mg (02/28/2020), 6,050 mg (03/13/2020), 6,050 mg (04/11/2020)  for chemotherapy treatment.    04/24/2020 -  Chemotherapy   The patient had fluorouracil (ADRUCIL) 2,800 mg in sodium chloride 0.9 % 94 mL chemo infusion, 225 mg/m2/day = 2,800 mg, Intravenous, 5D (120 hours), 5 of 5 cycles Administration: 2,800 mg (04/24/2020), 2,800 mg (05/01/2020), 2,800 mg (05/08/2020), 2,800 mg (05/15/2020), 2,800 mg (05/22/2020)  for chemotherapy treatment.       HISTORY OF PRESENTING ILLNESS:  Stephen Vasquez 59 y.o.  male  With stage III rectal cancer currently s/p TNT-FOLFOX chemotherapy & s/p chemoradiation May 29, 2020 is here for follow-up.    Patient denies any shortness of breath or cough.  Mild tingling and numbness.  Mild to moderate fatigue.  No nausea no vomiting.  Intermittent rectal discomfort; intermittent blood/mucus in stool.  Otherwise not significantly worse.   Review of Systems  Constitutional: Positive for malaise/fatigue. Negative for chills,  diaphoresis, fever and weight loss.  HENT: Negative for nosebleeds and sore throat.    Eyes: Negative for double vision.  Respiratory: Negative for cough, hemoptysis, sputum production, shortness of breath and wheezing.   Cardiovascular: Negative for chest pain, palpitations, orthopnea and leg swelling.  Gastrointestinal: Negative for abdominal pain, constipation, diarrhea, heartburn, melena, nausea and vomiting.  Genitourinary: Negative for dysuria, frequency and urgency.  Musculoskeletal: Positive for joint pain. Negative for back pain.  Skin: Negative.  Negative for itching and rash.  Neurological: Positive for tingling. Negative for dizziness, focal weakness, weakness and headaches.  Endo/Heme/Allergies: Does not bruise/bleed easily.  Psychiatric/Behavioral: Negative for depression. The patient is not nervous/anxious and does not have insomnia.      MEDICAL HISTORY:  Past Medical History:  Diagnosis Date  . Allergic rhinitis   . Heart murmur   . Rectal cancer [facility])     SURGICAL HISTORY: Past Surgical History:  Procedure Laterality Date  . COLONOSCOPY WITH PROPOFOL N/A 12/15/2019   Procedure: COLONOSCOPY WITH PROPOFOL;  Surgeon: [name], MD;  Location: [facility] ENDOSCOPY;  Service: Gastroenterology;  Laterality: N/A;  . PORTACATH PLACEMENT Left 12/29/2019   Procedure: INSERTION PORT-A-CATH;  Surgeon: Robert Bellow, MD;  Location: [facility] ORS;  Service: General;  Laterality: Left;    SOCIAL HISTORY: Social History   Socioeconomic History  . Marital status: Married    Spouse name: Not on file  . Number of children: Not on file  . Years of education: Not on file  . Highest education level: Not on file  Occupational History  . Not on file  Tobacco Use  . Smoking status: Former Research scientist (life sciences)  . Smokeless tobacco: Never Used  Vaping Use  . Vaping Use: Never used  Substance and Sexual Activity  . Alcohol use: Not Currently  . Drug use: Never  . Sexual activity: Yes  Other Topics Concern  . Not on file  Social History Narrative   Lives in [place]; Freight forwarder at  [employer]; 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 2 children- in boy and girls in late 76s.    Social Determinants of Health   Financial Resource Strain:   . Difficulty of Paying Living Expenses: Not on file  Food Insecurity:   . Worried About Charity fundraiser in the Last Year: Not on file  . Ran Out of Food in the Last Year: Not on file  Transportation Needs:   . Lack of Transportation (Medical): Not on file  . Lack of Transportation (Non-Medical): Not on file  Physical Activity:   . Days of Exercise per Week: Not on file  . Minutes of Exercise per Session: Not on file  Stress:   . Feeling of Stress : Not on file  Social Connections:   . Frequency of Communication with Friends and Family: Not on file  . Frequency of Social Gatherings with Friends and Family: Not on file  . Attends Religious Services: Not on file  . Active Member of Clubs or Organizations: Not on file  . Attends Archivist Meetings: Not on file  . Marital Status: Not on file  Intimate Partner Violence:   . Fear of Current or Ex-Partner: Not on file  . Emotionally Abused: Not on file  . Physically Abused: Not on file  . Sexually Abused: Not on file    FAMILY HISTORY: Family History  Problem Relation Age of Onset  . Hypertension Mother   . Hypertension Father   . Stomach cancer Father  in 29s- survived.   Marland Kitchen Heart disease Brother     ALLERGIES:  has No Known Allergies.  MEDICATIONS:  Current Outpatient Medications  Medication Sig Dispense Refill  . Acetaminophen (TYLENOL EXTRA STRENGTH PO) Take 1 tablet by mouth every 8 (eight) hours as needed (pain).    Marland Kitchen azelastine (ASTELIN) 0.1 % nasal spray Place 2 sprays into both nostrils 2 (two) times daily. Use in each nostril as directed 30 mL 12  . HYDROcodone-acetaminophen (NORCO/VICODIN) 5-325 MG tablet Take 1 tablet by mouth every 8 (eight) hours as needed for moderate pain. 30 tablet 0  . lidocaine-prilocaine (EMLA) cream Apply 1  application topically as needed (port access). 30 g 3  . montelukast (SINGULAIR) 10 MG tablet Take 1 tablet (10 mg total) by mouth at bedtime. 30 tablet 3  . oxymetazoline (AFRIN) 0.05 % nasal spray Place 1 spray into both nostrils 2 (two) times daily as needed for congestion.    . ondansetron (ZOFRAN) 8 MG tablet One pill every 8 hours as needed for nausea/vomitting. (Patient not taking: Reported on 05/22/2020) 40 tablet 1  . prochlorperazine (COMPAZINE) 10 MG tablet Take 1 tablet (10 mg total) by mouth every 6 (six) hours as needed for nausea or vomiting. (Patient not taking: Reported on 05/22/2020) 40 tablet 1   No current facility-administered medications for this visit.      Marland Kitchen  PHYSICAL EXAMINATION: ECOG PERFORMANCE STATUS: 1 - Symptomatic but completely ambulatory  Vitals:   06/07/20 1408  BP: 138/79  Pulse: 73  Resp: 18  Temp: 97.7 F (36.5 C)  SpO2: 100%   Filed Weights   06/07/20 1408  Weight: 264 lb 8 oz (120 kg)    Physical Exam Constitutional:      Comments: He is alone.   Walk independently.  HENT:     Head: Normocephalic and atraumatic.     Mouth/Throat:     Pharynx: No oropharyngeal exudate.  Eyes:     Pupils: Pupils are equal, round, and reactive to light.  Cardiovascular:     Rate and Rhythm: Normal rate and regular rhythm.  Pulmonary:     Effort: Pulmonary effort is normal. No respiratory distress.     Breath sounds: Normal breath sounds. No wheezing.  Abdominal:     General: Bowel sounds are normal. There is no distension.     Palpations: Abdomen is soft. There is no mass.     Tenderness: There is no abdominal tenderness. There is no guarding or rebound.  Musculoskeletal:        General: No tenderness. Normal range of motion.     Cervical back: Normal range of motion and neck supple.  Skin:    General: Skin is warm.  Neurological:     Mental Status: He is alert and oriented to person, place, and time.  Psychiatric:        Mood and Affect:  Affect normal.      LABORATORY DATA:  I have reviewed the data as listed Lab Results  Component Value Date   WBC 4.1 06/07/2020   HGB 12.0 (L) 06/07/2020   HCT 35.9 (L) 06/07/2020   MCV 95.0 06/07/2020   PLT 197 06/07/2020   Recent Labs    04/24/20 0831 04/24/20 0831 05/01/20 0843 05/01/20 0843 05/08/20 0828 05/08/20 0828 05/15/20 0835 05/22/20 0845 06/07/20 1400  NA 138   < > 137   < > 137   < > 136 138 141  K 3.8   < > 4.0   < >  4.0   < > 3.9 3.8 4.1  CL 104   < > 107   < > 106   < > 104 107 107  CO2 25   < > 25   < > 25   < > 23 24 27   GLUCOSE 100*   < > 99   < > 105*   < > 101* 107* 107*  BUN 14   < > 21*   < > 21*   < > 21* 19 18  CREATININE 1.01   < > 1.01   < > 0.94   < > 0.94 1.06 1.03  CALCIUM 8.8*   < > 8.7*   < > 8.9   < > 8.7* 9.1 9.3  GFRNONAA >60   < > >60   < > >60  --  >60 >60 >60  GFRAA >60  --  >60  --  >60  --   --   --   --   PROT 7.9   < > 7.9   < > 8.2*   < > 7.7 8.3* 8.1  ALBUMIN 3.6   < > 3.6   < > 3.8   < > 3.8 3.9 4.1  AST 31   < > 38   < > 35   < > 32 34 24  ALT 27   < > 31   < > 28   < > 25 29 19   ALKPHOS 187*   < > 159*   < > 141*   < > 134* 138* 114  BILITOT 0.6   < > 0.5   < > 0.7   < > 0.5 0.8 0.6   < > = values in this interval not displayed.    RADIOGRAPHIC STUDIES: I have personally reviewed the radiological images as listed and agreed with the findings in the report. No results found.  ASSESSMENT & PLAN:   Rectal cancer ([place]) #Rectal adenocarcinoma- moderate differentiated; stage III [multiple pelvic lymph nodes]; question borderline 9 mm retroperitoneal lymphadenopathy.12/27/2019- PET-bulky rectal mass; multiple lymph nodes noted; no uptake in the retroperitoneal lymph nodes; MRI rectum- T3N2M0;   Pretreatment CEA 94.  S/p TNT [total neoadjuvant therapy]; currently s/p FOLFOX chemotherapy. CEA- pre-treatment- ~100; CEA- 2.0; also s/p 5FU- CIV M-F; RT [ 9/20-10/25].  #We will plan to get a restaging chest abdomen pelvis CT scan  in the first week of December.  Patient appointment with [facility] surgery on December 4th. Dr. Audie Clear; planning surgery early Jan 4th 2022.   # Rectal pain/spasm/intermittent bleeding [sec to RT]-improved.  Monitor closely.  # PN-cold sensistivity-G-1-stable.  # Iron deficient anemia/chronic GI bleed-Hb12; HOLD IV iron today.   DISPOSITION:  # CT C/A/P on nov 29th;  # follow  Nov 30th or 1st;  up MD-No labs; Dr.B    All questions were answered. The patient knows to call the clinic with any problems, questions or concerns.    Cammie Sickle, MD 06/08/2020 7:57 PM

## 2020-06-07 NOTE — Assessment & Plan Note (Addendum)
#  Rectal adenocarcinoma- moderate differentiated; stage III [multiple pelvic lymph nodes]; question borderline 9 mm retroperitoneal lymphadenopathy.12/27/2019- PET-bulky rectal mass; multiple lymph nodes noted; no uptake in the retroperitoneal lymph nodes; MRI rectum- T3N2M0;   Pretreatment CEA 94.  S/p TNT [total neoadjuvant therapy]; currently s/p FOLFOX chemotherapy. CEA- pre-treatment- ~100; CEA- 2.0; also s/p 5FU- CIV M-F; RT [ 9/20-10/25].  #We will plan to get a restaging chest abdomen pelvis CT scan in the first week of December.  Patient appointment with [facility] surgery on December 4th. Dr. Audie Clear; planning surgery early Jan 4th 2022.   # Rectal pain/spasm/intermittent bleeding [sec to RT]-improved.  Monitor closely.  # PN-cold sensistivity-G-1-stable.  # Iron deficient anemia/chronic GI bleed-Hb12; HOLD IV iron today.   DISPOSITION:  # CT C/A/P on nov 29th;  # follow  Nov 30th or 1st;  up MD-No labs; Dr.B

## 2020-06-08 LAB — CEA: CEA: 1.5 ng/mL (ref 0.0–4.7)

## 2020-07-03 ENCOUNTER — Other Ambulatory Visit: Payer: Self-pay

## 2020-07-03 ENCOUNTER — Ambulatory Visit
Admission: RE | Admit: 2020-07-03 | Discharge: 2020-07-03 | Disposition: A | Discharge status: Home / Self Care | Payer: 59 | Source: Ambulatory Visit | Attending: Internal Medicine | Admitting: Internal Medicine

## 2020-07-03 DIAGNOSIS — C20 Malignant neoplasm of rectum: Secondary | ICD-10-CM | POA: Diagnosis not present

## 2020-07-03 MED ORDER — IOHEXOL 300 MG/ML  SOLN
100.0000 mL | Freq: Once | INTRAMUSCULAR | Status: AC | PRN
  Administered 2020-07-03: 100 mL via INTRAVENOUS

## 2020-07-04 ENCOUNTER — Encounter: Payer: Self-pay | Admitting: Radiation Oncology

## 2020-07-04 ENCOUNTER — Inpatient Hospital Stay (HOSPITAL_BASED_OUTPATIENT_CLINIC_OR_DEPARTMENT_OTHER): Payer: 59 | Admitting: Internal Medicine

## 2020-07-04 DIAGNOSIS — C20 Malignant neoplasm of rectum: Secondary | ICD-10-CM | POA: Diagnosis not present

## 2020-07-04 NOTE — Assessment & Plan Note (Addendum)
#  Rectal adenocarcinoma- moderate differentiated; stage III [multiple pelvic lymph nodes]; question borderline 9 mm retroperitoneal lymphadenopathy. S/P total neoadjuvant chemotherapy/radiation therapy.  July 03, 2020-CT scan shows significant improvement of rectal mass currently measuring 3.5 x 3.5 cm; improvement of the pelvic lymph nodes.  Stable 8-9 mm retroperitoneal lymph node; no progressive disease anywhere else.  #I reviewed the encouraging results on the CT scan the patient and wife.  Appointment with Dr.Saiq on 12/04.  With plan for surgery in early January.  # PN-cold sensistivity-G-1-STABLE.   # Iron deficient anemia/chronic GI bleed November 2021 hemoglobin 12; monitor for now.  DISPOSITION:  # follow up in first week of Feb, 2021- PORT FLUSH; cbc/cmp/CEA-Dr.B

## 2020-07-04 NOTE — Patient Instructions (Signed)
#   Please take CD of May 2021-CT scan/PET scan/MRI rectum/ CT scan from Nov 2021 to your appt at [facility].

## 2020-07-04 NOTE — Progress Notes (Signed)
[facility] CONSULT NOTE  Patient Care Team: Volney American, PA-C as PCP - General (Family Medicine) Clent Jacks, RN as Oncology Nurse Navigator [name], MD as Consulting Physician (Gastroenterology) Cammie Sickle, MD as Consulting Physician (Internal Medicine) Cammie Sickle, MD as Consulting Physician (Hematology and Oncology)  CHIEF COMPLAINTS/PURPOSE OF CONSULTATION: Rectal cancer  #  Oncology History Overview Note  # MAY 2021-rectal adenocarcinoma ; moderately differentiated; [Dr.Anna]; colonoscopy-partially obstructing circumferential rectal mass-starting at anal verge to proximal. CEA- 96. CT C/A/P- Large mass involving the rectum is identified compatible with primary rectal carcinoma. This has a large, 6.7 cm exophytic component in the left pelvis. Multiple perirectal lymph nodes are identified compatible with metastatic adenopathy; Borderline enlarged aortocaval node measures 0.9 cm; PET-bulky rectal mass; multiple lymph nodes noted; no uptake in the retroperitoneal lymph nodes; MRI rectum- T3N2M0;   Pretreatment CEA 94; TNT  # June 1st 2021- FOLFOX T3N2M0;   Pretreatment CEA 94.  S/p TNT [total neoadjuvant therapy]; currently s/p FOLFOX chemotherapy. CEA- pre-treatment- ~100; CEA- 2.0; also s/p 5FU- CIV M-F; RT [ 9/20-10/25].   # SURVIVORSHIP:   # GENETICS:   DIAGNOSIS: RECTAL CA  STAGE:   III     ;  GOALS: cure  CURRENT/MOST RECENT THERAPY : FOLFOX [C]   Rectal cancer ([place])  12/20/2019 Initial Diagnosis   Rectal cancer ([place])   01/04/2020 - 04/11/2020 Chemotherapy   The patient had dexamethasone (DECADRON) 4 MG tablet, 8 mg, Oral, Daily, 1 of 1 cycle, Start date: --, End date: -- palonosetron (ALOXI) injection 0.25 mg, 0.25 mg, Intravenous,  Once, 8 of 8 cycles Administration: 0.25 mg (01/04/2020), 0.25 mg (01/17/2020), 0.25 mg (01/31/2020), 0.25 mg (02/14/2020), 0.25 mg (02/28/2020), 0.25 mg (03/13/2020), 0.25 mg  (04/11/2020) pegfilgrastim (NEULASTA ONPRO KIT) injection 6 mg, 6 mg, Subcutaneous, Once, 2 of 2 cycles Administration: 6 mg (02/02/2020) leucovorin 1,000 mg in dextrose 5 % 250 mL infusion, 1,008 mg, Intravenous,  Once, 8 of 8 cycles Administration: 1,000 mg (01/04/2020), 1,000 mg (01/17/2020), 1,000 mg (01/31/2020), 1,000 mg (02/14/2020), 1,000 mg (02/28/2020), 1,000 mg (03/13/2020), 1,000 mg (04/11/2020) oxaliplatin (ELOXATIN) 200 mg in dextrose 5 % 500 mL chemo infusion, 215 mg, Intravenous,  Once, 8 of 8 cycles Administration: 200 mg (01/04/2020), 200 mg (01/17/2020), 200 mg (01/31/2020), 200 mg (02/14/2020), 200 mg (02/28/2020), 200 mg (03/13/2020), 200 mg (04/11/2020) fluorouracil (ADRUCIL) chemo injection 1,000 mg, 400 mg/m2 = 1,000 mg, Intravenous,  Once, 2 of 2 cycles Administration: 1,000 mg (01/04/2020), 1,000 mg (01/17/2020) fluorouracil (ADRUCIL) 6,050 mg in sodium chloride 0.9 % 129 mL chemo infusion, 2,400 mg/m2 = 6,050 mg, Intravenous, 1 Day/Dose, 8 of 8 cycles Administration: 6,050 mg (01/04/2020), 6,050 mg (01/17/2020), 6,050 mg (01/31/2020), 6,050 mg (02/14/2020), 6,050 mg (02/28/2020), 6,050 mg (03/13/2020), 6,050 mg (04/11/2020)  for chemotherapy treatment.    04/24/2020 -  Chemotherapy   The patient had fluorouracil (ADRUCIL) 2,800 mg in sodium chloride 0.9 % 94 mL chemo infusion, 225 mg/m2/day = 2,800 mg, Intravenous, 5D (120 hours), 5 of 5 cycles Administration: 2,800 mg (04/24/2020), 2,800 mg (05/01/2020), 2,800 mg (05/08/2020), 2,800 mg (05/15/2020), 2,800 mg (05/22/2020)  for chemotherapy treatment.       HISTORY OF PRESENTING ILLNESS:  Stephen Vasquez 59 y.o.  male  With stage III rectal cancer currently s/p TNT-FOLFOX chemotherapy & s/p chemoradiation May 29, 2020 is here for follow-up/review results of the CT scan.  No new shortness of breath or cough with no nausea vomiting abdominal pain.  No obvious  blood in stools.  Mild tingling and numbness in extremities.  Mild to moderate fatigue  Review  of Systems  Constitutional: Positive for malaise/fatigue. Negative for chills, diaphoresis, fever and weight loss.  HENT: Negative for nosebleeds and sore throat.   Eyes: Negative for double vision.  Respiratory: Negative for cough, hemoptysis, sputum production, shortness of breath and wheezing.   Cardiovascular: Negative for chest pain, palpitations, orthopnea and leg swelling.  Gastrointestinal: Negative for abdominal pain, constipation, diarrhea, heartburn, melena, nausea and vomiting.  Genitourinary: Negative for dysuria, frequency and urgency.  Musculoskeletal: Positive for joint pain. Negative for back pain.  Skin: Negative.  Negative for itching and rash.  Neurological: Positive for tingling. Negative for dizziness, focal weakness, weakness and headaches.  Endo/Heme/Allergies: Does not bruise/bleed easily.  Psychiatric/Behavioral: Negative for depression. The patient is not nervous/anxious and does not have insomnia.      MEDICAL HISTORY:  Past Medical History:  Diagnosis Date  . Allergic rhinitis   . Heart murmur   . Rectal cancer [facility])     SURGICAL HISTORY: Past Surgical History:  Procedure Laterality Date  . COLONOSCOPY WITH PROPOFOL N/A 12/15/2019   Procedure: COLONOSCOPY WITH PROPOFOL;  Surgeon: [name], MD;  Location: [facility] ENDOSCOPY;  Service: Gastroenterology;  Laterality: N/A;  . PORTACATH PLACEMENT Left 12/29/2019   Procedure: INSERTION PORT-A-CATH;  Surgeon: Robert Bellow, MD;  Location: [facility] ORS;  Service: General;  Laterality: Left;    SOCIAL HISTORY: Social History   Socioeconomic History  . Marital status: Married    Spouse name: Not on file  . Number of children: Not on file  . Years of education: Not on file  . Highest education level: Not on file  Occupational History  . Not on file  Tobacco Use  . Smoking status: Former Research scientist (life sciences)  . Smokeless tobacco: Never Used  Vaping Use  . Vaping Use: Never used  Substance and Sexual Activity  .  Alcohol use: Not Currently  . Drug use: Never  . Sexual activity: Yes  Other Topics Concern  . Not on file  Social History Narrative   Lives in [place]; Freight forwarder at [employer]; 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 2 children- in boy and girls in late 65s.    Social Determinants of Health   Financial Resource Strain:   . Difficulty of Paying Living Expenses: Not on file  Food Insecurity:   . Worried About Charity fundraiser in the Last Year: Not on file  . Ran Out of Food in the Last Year: Not on file  Transportation Needs:   . Lack of Transportation (Medical): Not on file  . Lack of Transportation (Non-Medical): Not on file  Physical Activity:   . Days of Exercise per Week: Not on file  . Minutes of Exercise per Session: Not on file  Stress:   . Feeling of Stress : Not on file  Social Connections:   . Frequency of Communication with Friends and Family: Not on file  . Frequency of Social Gatherings with Friends and Family: Not on file  . Attends Religious Services: Not on file  . Active Member of Clubs or Organizations: Not on file  . Attends Archivist Meetings: Not on file  . Marital Status: Not on file  Intimate Partner Violence:   . Fear of Current or Ex-Partner: Not on file  . Emotionally Abused: Not on file  . Physically Abused: Not on file  . Sexually Abused: Not on file  FAMILY HISTORY: Family History  Problem Relation Age of Onset  . Hypertension Mother   . Hypertension Father   . Stomach cancer Father        in 71s- survived.   Marland Kitchen Heart disease Brother     ALLERGIES:  has No Known Allergies.  MEDICATIONS:  Current Outpatient Medications  Medication Sig Dispense Refill  . Acetaminophen (TYLENOL EXTRA STRENGTH PO) Take 1 tablet by mouth every 8 (eight) hours as needed (pain).    Marland Kitchen azelastine (ASTELIN) 0.1 % nasal spray Place 2 sprays into both nostrils 2 (two) times daily. Use in each nostril as directed 30 mL 12  . HYDROcodone-acetaminophen  (NORCO/VICODIN) 5-325 MG tablet Take 1 tablet by mouth every 8 (eight) hours as needed for moderate pain. 30 tablet 0  . lidocaine-prilocaine (EMLA) cream Apply 1 application topically as needed (port access). 30 g 3  . montelukast (SINGULAIR) 10 MG tablet Take 1 tablet (10 mg total) by mouth at bedtime. 30 tablet 3  . ondansetron (ZOFRAN) 8 MG tablet One pill every 8 hours as needed for nausea/vomitting. 40 tablet 1  . oxymetazoline (AFRIN) 0.05 % nasal spray Place 1 spray into both nostrils 2 (two) times daily as needed for congestion.    . prochlorperazine (COMPAZINE) 10 MG tablet Take 1 tablet (10 mg total) by mouth every 6 (six) hours as needed for nausea or vomiting. 40 tablet 1   No current facility-administered medications for this visit.      Marland Kitchen  PHYSICAL EXAMINATION: ECOG PERFORMANCE STATUS: 1 - Symptomatic but completely ambulatory  Vitals:   07/04/20 1341  BP: 133/78  Pulse: 76  Temp: 98.3 F (36.8 C)  SpO2: 100%   Filed Weights   07/04/20 1341  Weight: 260 lb 3.2 oz (118 kg)    Physical Exam Constitutional:      Comments: He is alone.   Walk independently.  HENT:     Head: Normocephalic and atraumatic.     Mouth/Throat:     Pharynx: No oropharyngeal exudate.  Eyes:     Pupils: Pupils are equal, round, and reactive to light.  Cardiovascular:     Rate and Rhythm: Normal rate and regular rhythm.  Pulmonary:     Effort: Pulmonary effort is normal. No respiratory distress.     Breath sounds: Normal breath sounds. No wheezing.  Abdominal:     General: Bowel sounds are normal. There is no distension.     Palpations: Abdomen is soft. There is no mass.     Tenderness: There is no abdominal tenderness. There is no guarding or rebound.  Musculoskeletal:        General: No tenderness. Normal range of motion.     Cervical back: Normal range of motion and neck supple.  Skin:    General: Skin is warm.  Neurological:     Mental Status: He is alert and oriented to  person, place, and time.  Psychiatric:        Mood and Affect: Affect normal.      LABORATORY DATA:  I have reviewed the data as listed Lab Results  Component Value Date   WBC 4.1 06/07/2020   HGB 12.0 (L) 06/07/2020   HCT 35.9 (L) 06/07/2020   MCV 95.0 06/07/2020   PLT 197 06/07/2020   Recent Labs    04/24/20 0831 04/24/20 0831 05/01/20 4481 05/01/20 8563 05/08/20 0828 05/08/20 0828 05/15/20 0835 05/22/20 0845 06/07/20 1400  NA 138   < > 137   < >  137   < > 136 138 141  K 3.8   < > 4.0   < > 4.0   < > 3.9 3.8 4.1  CL 104   < > 107   < > 106   < > 104 107 107  CO2 25   < > 25   < > 25   < > _0 GLUCOSE 100*   < > 99   < > 105*   < > 101* 107* 107*  BUN 14   < > 21*   < > 21*   < > 21* 19 18  CREATININE 1.01   < > 1.01   < > 0.94   < > 0.94 1.06 1.03  CALCIUM 8.8*   < > 8.7*   < > 8.9   < > 8.7* 9.1 9.3  GFRNONAA >60   < > >60   < > >60  --  >60 >60 >60  GFRAA >60  --  >60  --  >60  --   --   --   --   PROT 7.9   < > 7.9   < > 8.2*   < > 7.7 8.3* 8.1  ALBUMIN 3.6   < > 3.6   < > 3.8   < > 3.8 3.9 4.1  AST 31   < > 38   < > 35   < > 32 34 24  ALT 27   < > 31   < > 28   < > _1 ALKPHOS 187*   < > 159*   < > 141*   < > 134* 138* 114  BILITOT 0.6   < > 0.5   < > 0.7   < > 0.5 0.8 0.6   < > = values in this interval not displayed.    RADIOGRAPHIC STUDIES: I have personally reviewed the radiological images as listed and agreed with the findings in the report. CT CHEST ABDOMEN PELVIS W CONTRAST  Result Date: 07/03/2020 CLINICAL DATA:  Restaging colorectal carcinoma. EXAM: CT CHEST, ABDOMEN, AND PELVIS WITH CONTRAST TECHNIQUE: Multidetector CT imaging of the chest, abdomen and pelvis was performed following the standard protocol during bolus administration of intravenous contrast. CONTRAST:  163m OMNIPAQUE IOHEXOL 300 MG/ML  SOLN COMPARISON:  PET-CT 12/27/2019 and CT CAP 12/17/2019 FINDINGS: CT CHEST FINDINGS Cardiovascular: Heart size appears within normal  limits. Aortic atherosclerosis. No pericardial effusion. Coronary artery calcifications. Mediastinum/Nodes: No enlarged mediastinal, hilar, or axillary lymph nodes. Thyroid gland, trachea, and esophagus demonstrate no significant findings. Lungs/Pleura: No pleural effusion identified. No airspace consolidation, atelectasis, or pneumothorax. No suspicious lung nodules identified. Musculoskeletal: No chest wall mass or suspicious bone lesions identified. CT ABDOMEN PELVIS FINDINGS Hepatobiliary: No focal liver abnormality is seen. No gallstones, gallbladder wall thickening, or biliary dilatation. Pancreas: Unremarkable. No pancreatic ductal dilatation or surrounding inflammatory changes. Spleen: Normal in size without focal abnormality. Adrenals/Urinary Tract: Normal appearance of the adrenal glands. No kidney mass or hydronephrosis. Urinary bladder appears within normal limits. Stomach/Bowel: Stomach appears normal. The appendix is visualized and is unremarkable. No small bowel wall thickening, inflammation or distension. Sigmoid diverticulosis identified. No signs of acute diverticulitis. Circumferential wall thickening of the rectum is again noted in appears improved from previous exam. The mass arising from the left lateral wall of the rectum measures 3.4 x 3.6 by 3.4 cm (volume = 22 cm^3). This is compared with 7.1 x 6.5 by 7.4 cm (volume =  180 cm^3). Previous perirectal adenopathy has resolved in the interval. Vascular/Lymphatic: Aortic atherosclerosis. No aneurysm. Previous small aortocaval lymph node is again noted measuring 0.8 cm, image 86/2. Previously 0.9 cm. Index left external iliac node measures 0.6 cm, image 113/2. Previously 0.7 cm. No new or progressive adenopathy identified. Reproductive: Prostate is unremarkable. Other: There is no free fluid or fluid collections identified within the abdomen or pelvis. Musculoskeletal: No suspicious bone lesions IMPRESSION: 1. Interval response to therapy.  Decrease in size of primary rectal neoplasm and perirectal adenopathy. 2. No new or progressive disease identified. 3. Aortic atherosclerosis and coronary artery calcifications. Aortic Atherosclerosis (ICD10-I70.0). Electronically Signed   By: Kerby Moors M.D.   On: 07/03/2020 10:35    ASSESSMENT & PLAN:   Rectal cancer ([place]) #Rectal adenocarcinoma- moderate differentiated; stage III [multiple pelvic lymph nodes]; question borderline 9 mm retroperitoneal lymphadenopathy. S/P total neoadjuvant chemotherapy/radiation therapy.  July 03, 2020-CT scan shows significant improvement of rectal mass currently measuring 3.5 x 3.5 cm; improvement of the pelvic lymph nodes.  Stable 8-9 mm retroperitoneal lymph node; no progressive disease anywhere else.  #I reviewed the encouraging results on the CT scan the patient and wife.  Appointment with Dr.Saiq on 12/04.  With plan for surgery in early January.  # PN-cold sensistivity-G-1-STABLE.   # Iron deficient anemia/chronic GI bleed November 2021 hemoglobin 12; monitor for now.  DISPOSITION:  # follow up in first week of Feb, 2021- PORT FLUSH; cbc/cmp/CEA-Dr.B    All questions were answered. The patient knows to call the clinic with any problems, questions or concerns.    Cammie Sickle, MD 07/04/2020 3:03 PM

## 2020-07-05 ENCOUNTER — Ambulatory Visit
Admission: RE | Admit: 2020-07-05 | Discharge: 2020-07-05 | Disposition: A | Discharge status: Home / Self Care | Payer: 59 | Source: Ambulatory Visit | Attending: Radiation Oncology | Admitting: Radiation Oncology

## 2020-07-05 ENCOUNTER — Encounter: Payer: Self-pay | Admitting: Radiation Oncology

## 2020-07-05 ENCOUNTER — Other Ambulatory Visit: Payer: Self-pay

## 2020-07-05 VITALS — BP 133/81 | HR 78 | Temp 96.6°F | Wt 260.0 lb

## 2020-07-05 DIAGNOSIS — C20 Malignant neoplasm of rectum: Secondary | ICD-10-CM

## 2020-07-05 NOTE — Progress Notes (Signed)
Radiation Oncology Follow up Note  Name: Stephen Vasquez   Date:   07/05/2020 MRN:  [id number] DOB: Apr 07, 1961    This 59 y.o. male presents to the clinic today for 1 month follow-up status post concurrent chemoradiation therapy for stage III (T3 N2 M0) adenocarcinoma of the distal rectum status post neoadjuvant FOLFOX chemotherapy TNT.Marland Kitchen  REFERRING PROVIDER: Volney American,*  HPI: Patient is a 59 year old male now at 1 month from completing concurrent chemoradiation therapy in a neoadjuvant fashion for stage III adenocarcinoma of the distal rectum.  He is seen today in routine follow-up and is doing well he is having no significant diarrhea any increased lower urinary tract symptoms or fatigue.Marland Kitchen  He had a recent CT scan showing significant improvement of the rectal mass currently measuring 3.5 cm.  He had improvement also his pelvic lymph nodes.  There is no evidence of progressive disease.  He has an appointment for surgical resection in early January with Dr.Saig.  COMPLICATIONS OF TREATMENT: none  FOLLOW UP COMPLIANCE: keeps appointments   PHYSICAL EXAM:  BP 133/81   Pulse 78   Temp (!) 96.6 F (35.9 C) (Tympanic)   Wt 260 lb (117.9 kg)   BMI 34.30 kg/m  Well-developed well-nourished patient in NAD. HEENT reveals PERLA, EOMI, discs not visualized.  Oral cavity is clear. No oral mucosal lesions are identified. Neck is clear without evidence of cervical or supraclavicular adenopathy. Lungs are clear to A&P. Cardiac examination is essentially unremarkable with regular rate and rhythm without murmur rub or thrill. Abdomen is benign with no organomegaly or masses noted. Motor sensory and DTR levels are equal and symmetric in the upper and lower extremities. Cranial nerves II through XII are grossly intact. Proprioception is intact. No peripheral adenopathy or edema is identified. No motor or sensory levels are noted. Crude visual fields are within normal range.  RADIOLOGY RESULTS: CT  scan reviewed compatible with above-stated findings  PLAN: Present time patient is doing well has had excellent response to neoadjuvant chemoradiation.  I would like to see him back in 3 months after completion of his surgical resection.  Patient otherwise is doing well knows to call with any concerns at any time.  I would like to take this opportunity to thank you for allowing me to participate in the care of your patient.Noreene Filbert, MD

## 2020-08-05 HISTORY — PX: COLOSTOMY: SHX63

## 2020-08-30 ENCOUNTER — Telehealth: Payer: Self-pay | Admitting: Internal Medicine

## 2020-08-30 NOTE — Telephone Encounter (Signed)
On 1/25-I called the patient and reviewed the results of the pathology from [facility].  Patient will keep appointment as planned.

## 2020-09-06 ENCOUNTER — Inpatient Hospital Stay: Payer: 59 | Attending: Internal Medicine

## 2020-09-06 ENCOUNTER — Inpatient Hospital Stay (HOSPITAL_BASED_OUTPATIENT_CLINIC_OR_DEPARTMENT_OTHER): Payer: 59 | Admitting: Internal Medicine

## 2020-09-06 ENCOUNTER — Other Ambulatory Visit: Payer: Self-pay

## 2020-09-06 VITALS — BP 121/90 | HR 73 | Temp 97.3°F | Resp 20 | Ht 73.0 in | Wt 270.0 lb

## 2020-09-06 DIAGNOSIS — N529 Male erectile dysfunction, unspecified: Secondary | ICD-10-CM | POA: Insufficient documentation

## 2020-09-06 DIAGNOSIS — G620 Drug-induced polyneuropathy: Secondary | ICD-10-CM | POA: Insufficient documentation

## 2020-09-06 DIAGNOSIS — C20 Malignant neoplasm of rectum: Secondary | ICD-10-CM | POA: Insufficient documentation

## 2020-09-06 DIAGNOSIS — N5239 Other post-surgical erectile dysfunction: Secondary | ICD-10-CM

## 2020-09-06 LAB — CBC WITH DIFFERENTIAL/PLATELET
Abs Immature Granulocytes: 0.01 10*3/uL (ref 0.00–0.07)
Basophils Absolute: 0 10*3/uL (ref 0.0–0.1)
Basophils Relative: 1 %
Eosinophils Absolute: 0.1 10*3/uL (ref 0.0–0.5)
Eosinophils Relative: 2 %
HCT: 34.1 % — ABNORMAL LOW (ref 39.0–52.0)
Hemoglobin: 11.6 g/dL — ABNORMAL LOW (ref 13.0–17.0)
Immature Granulocytes: 0 %
Lymphocytes Relative: 15 %
Lymphs Abs: 0.8 10*3/uL (ref 0.7–4.0)
MCH: 30.5 pg (ref 26.0–34.0)
MCHC: 34 g/dL (ref 30.0–36.0)
MCV: 89.7 fL (ref 80.0–100.0)
Monocytes Absolute: 0.4 10*3/uL (ref 0.1–1.0)
Monocytes Relative: 8 %
Neutro Abs: 3.8 10*3/uL (ref 1.7–7.7)
Neutrophils Relative %: 74 %
Platelets: 218 10*3/uL (ref 150–400)
RBC: 3.8 MIL/uL — ABNORMAL LOW (ref 4.22–5.81)
RDW: 12 % (ref 11.5–15.5)
WBC: 5.1 10*3/uL (ref 4.0–10.5)
nRBC: 0 % (ref 0.0–0.2)

## 2020-09-06 LAB — COMPREHENSIVE METABOLIC PANEL
ALT: 18 U/L (ref 0–44)
AST: 21 U/L (ref 15–41)
Albumin: 3.9 g/dL (ref 3.5–5.0)
Alkaline Phosphatase: 148 U/L — ABNORMAL HIGH (ref 38–126)
Anion gap: 9 (ref 5–15)
BUN: 22 mg/dL — ABNORMAL HIGH (ref 6–20)
CO2: 25 mmol/L (ref 22–32)
Calcium: 9 mg/dL (ref 8.9–10.3)
Chloride: 103 mmol/L (ref 98–111)
Creatinine, Ser: 0.99 mg/dL (ref 0.61–1.24)
GFR, Estimated: >60 mL/min (ref >60)
Glucose, Bld: 119 mg/dL — ABNORMAL HIGH (ref 70–99)
Potassium: 3.9 mmol/L (ref 3.5–5.1)
Sodium: 137 mmol/L (ref 135–145)
Total Bilirubin: 0.7 mg/dL (ref 0.3–1.2)
Total Protein: 7.8 g/dL (ref 6.5–8.1)

## 2020-09-06 MED ORDER — HEPARIN SOD (PORK) LOCK FLUSH 100 UNIT/ML IV SOLN
INTRAVENOUS | Status: AC
  Filled 2020-09-06: qty 5

## 2020-09-06 MED ORDER — HEPARIN SOD (PORK) LOCK FLUSH 100 UNIT/ML IV SOLN
500.0000 [IU] | Freq: Once | INTRAVENOUS | Status: AC
  Administered 2020-09-06: 500 [IU]
  Filled 2020-09-06: qty 5

## 2020-09-06 MED ORDER — SODIUM CHLORIDE 0.9% FLUSH
10.0000 mL | Freq: Once | INTRAVENOUS | Status: AC
  Administered 2020-09-06: 10 mL
  Filled 2020-09-06: qty 10

## 2020-09-06 NOTE — Assessment & Plan Note (Addendum)
#  Rectal adenocarcinoma- moderate differentiated; stage III [multiple pelvic lymph nodes]; ? borderline 9 mm retroperitoneal LN.  S/P total neoadjuvant chemotherapy/radiation therapy-s/p APR- JAN 2022 [[facility]]- pathologic CR.  #I reviewed the pathologic findings with the patient and family in detail.  Obviously complete pathologic response is an excellent prognostic marker.  However would recommend close monitoring-with surveillance imaging in 6 months.  Also recommend repeat colonoscopy within a year of diagnosis of rectal cancer.  Recommend follow-up with Dr. Vicente Males. Discussed with Dr.Byrnett.   # PN-cold sensistivity-G-1-STABLE.   #IV access: Mediport in place.  Discussed the pros and cons of continued Mediport.  Plan keeping it for now; flush every 3 months.  #Erectile dysfunction-question related to recent surgery.  We will make a referral to urology.  DISPOSITION:  # refer to [facility] urology- Dx; ED # port flush in 3 months # follow up in 6 months- PORT FLUSH; cbc/cmp/CEA; CT C/A/P-Dr.B

## 2020-09-06 NOTE — Progress Notes (Unsigned)
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 CEA- 30. CT C/A/P- Large mass involving the rectum is identified compatible with primary rectal carcinoma. This has a large, 6.7 cm exophytic component in the left pelvis. Multiple perirectal lymph nodes are identified compatible with metastatic adenopathy; Borderline enlarged aortocaval node measures 0.9 cm; PET-bulky rectal mass; multiple lymph nodes noted; no uptake in the retroperitoneal lymph nodes; MRI rectum- T3N2M0;   Pretreatment CEA 94; TNT  # June 1st 2021- FOLFOX T3N2M0;   Pretreatment CEA 94.  S/p TNT [total neoadjuvant therapy]; currently s/p FOLFOX chemotherapy. CEA- pre-treatment- ~100; CEA- 2.0; also s/p 5FU- CIV M-F; RT [ 9/20-10/25]. JAN 8th, 2022- APR-COMPLETE pathologic response. [Dr.Sadiq; [facility]]  # SURVIVORSHIP:   # GENETICS:   DIAGNOSIS: RECTAL CA  STAGE:   III     ;  GOALS: cure  CURRENT/MOST RECENT THERAPY : Surveillance   Rectal cancer ([place])  12/20/2019 Initial Diagnosis   Rectal cancer ([place])   01/04/2020 - 04/11/2020 Chemotherapy   The patient had dexamethasone (DECADRON) 4 MG tablet, 8 mg, Oral, Daily, 1 of 1 cycle, Start date: --, End date: -- palonosetron (ALOXI) injection 0.25 mg, 0.25 mg, Intravenous,  Once, 8 of 8 cycles Administration: 0.25 mg (01/04/2020), 0.25 mg (01/17/2020), 0.25 mg (01/31/2020), 0.25 mg (02/14/2020), 0.25 mg (02/28/2020), 0.25 mg  (03/13/2020), 0.25 mg (04/11/2020) pegfilgrastim (NEULASTA ONPRO KIT) injection 6 mg, 6 mg, Subcutaneous, Once, 2 of 2 cycles Administration: 6 mg (02/02/2020) leucovorin 1,000 mg in dextrose 5 % 250 mL infusion, 1,008 mg, Intravenous,  Once, 8 of 8 cycles Administration: 1,000 mg (01/04/2020), 1,000 mg (01/17/2020), 1,000 mg (01/31/2020), 1,000 mg (02/14/2020), 1,000 mg (02/28/2020), 1,000 mg (03/13/2020), 1,000 mg (04/11/2020) oxaliplatin (ELOXATIN) 200 mg in dextrose 5 % 500 mL chemo infusion, 215 mg, Intravenous,  Once, 8 of 8 cycles Administration: 200 mg (01/04/2020), 200 mg (01/17/2020), 200 mg (01/31/2020), 200 mg (02/14/2020), 200 mg (02/28/2020), 200 mg (03/13/2020), 200 mg (04/11/2020) fluorouracil (ADRUCIL) chemo injection 1,000 mg, 400 mg/m2 = 1,000 mg, Intravenous,  Once, 2 of 2 cycles Administration: 1,000 mg (01/04/2020), 1,000 mg (01/17/2020) fluorouracil (ADRUCIL) 6,050 mg in sodium chloride 0.9 % 129 mL chemo infusion, 2,400 mg/m2 = 6,050 mg, Intravenous, 1 Day/Dose, 8 of 8 cycles Administration: 6,050 mg (01/04/2020), 6,050 mg (01/17/2020), 6,050 mg (01/31/2020), 6,050 mg (02/14/2020), 6,050 mg (02/28/2020), 6,050 mg (03/13/2020), 6,050 mg (04/11/2020)  for chemotherapy treatment.    04/24/2020 -  Chemotherapy   The patient had fluorouracil (ADRUCIL) 2,800 mg in sodium chloride 0.9 % 94 mL chemo infusion, 225 mg/m2/day = 2,800 mg, Intravenous, 5D (120 hours), 5 of 5 cycles Administration: 2,800 mg (04/24/2020), 2,800 mg (05/01/2020), 2,800 mg (05/08/2020), 2,800 mg (05/15/2020), 2,800 mg (05/22/2020)  for chemotherapy treatment.       HISTORY OF PRESENTING ILLNESS:  Stephen Vasquez 60 y.o.  male  With stage III rectal cancer currently s/p TNT-FOLFOX chemotherapy & s/p chemoradiation May 29, 2020-s/p surgery at [facility] in January is here to review the results of his pathology/plan of care.  Patient denies any significant postoperative complications.  Recovery fairly uneventful.  No significant tingling and  numbness.  No significant fatigue.  Complains of erectile dysfunction since surgery.  Review of Systems  Constitutional: Positive for malaise/fatigue. Negative for chills, diaphoresis, fever and weight loss.  HENT: Negative for nosebleeds and sore throat.   Eyes: Negative for double vision.  Respiratory: Negative for cough, hemoptysis, sputum production, shortness of breath and wheezing.   Cardiovascular: Negative for chest pain, palpitations, orthopnea and leg swelling.  Gastrointestinal: Negative for abdominal pain, constipation, diarrhea, heartburn, melena, nausea and vomiting.  Genitourinary: Negative for dysuria, frequency and urgency.  Musculoskeletal: Positive for joint pain. Negative for back pain.  Skin: Negative.  Negative for itching and rash.  Neurological: Positive for tingling. Negative for dizziness, focal weakness, weakness and headaches.  Endo/Heme/Allergies: Does not bruise/bleed easily.  Psychiatric/Behavioral: Negative for depression. The patient is not nervous/anxious and does not have insomnia.      MEDICAL HISTORY:  Past Medical History:  Diagnosis Date  . Allergic rhinitis   . Heart murmur   . Rectal cancer [facility])     SURGICAL HISTORY: Past Surgical History:  Procedure Laterality Date  . COLONOSCOPY WITH PROPOFOL N/A 12/15/2019   Procedure: COLONOSCOPY WITH PROPOFOL;  Surgeon: [name], MD;  Location: [facility] ENDOSCOPY;  Service: Gastroenterology;  Laterality: N/A;  . PORTACATH PLACEMENT Left 12/29/2019   Procedure: INSERTION PORT-A-CATH;  Surgeon: Robert Bellow, MD;  Location: [facility] ORS;  Service: General;  Laterality: Left;    SOCIAL HISTORY: Social History   Socioeconomic History  . Marital status: Married    Spouse name: Not on file  . Number of children: Not on file  . Years of education: Not on file  . Highest education level: Not on file  Occupational History  . Not on file  Tobacco Use  . Smoking status: Former Research scientist (life sciences)  . Smokeless  tobacco: Never Used  Vaping Use  . Vaping Use: Never used  Substance and Sexual Activity  . Alcohol use: Not Currently  . Drug use: Never  . Sexual activity: Yes  Other Topics Concern  . Not on file  Social History Narrative   Lives in [place]; Freight forwarder at [employer]; 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 2 children- in boy and girls in late 15s.    Social Determinants of Health   Financial Resource Strain: Not on file  Food Insecurity: Not on file  Transportation Needs: Not on file  Physical Activity: Not on file  Stress: Not on file  Social Connections: Not on file  Intimate Partner Violence: Not on file    FAMILY HISTORY: Family History  Problem Relation Age of Onset  . Hypertension Mother   . Hypertension Father   . Stomach cancer Father        in 6s- survived.   Marland Kitchen Heart disease Brother     ALLERGIES:  has No Known Allergies.  MEDICATIONS:  Current Outpatient Medications  Medication Sig Dispense Refill  . Acetaminophen (TYLENOL EXTRA STRENGTH PO) Take 1 tablet by mouth every 8 (eight) hours as needed (pain).    Marland Kitchen azelastine (ASTELIN) 0.1 % nasal spray Place 2 sprays into both nostrils 2 (two) times daily. Use in each nostril as directed 30 mL 12  . lidocaine-prilocaine (EMLA) cream Apply 1 application topically as needed (port access). 30 g 3  . oxymetazoline (AFRIN) 0.05 % nasal spray Place 1 spray into both nostrils 2 (two) times daily as needed for congestion.    . montelukast (SINGULAIR) 10 MG tablet  Take 1 tablet (10 mg total) by mouth at bedtime. (Patient not taking: Reported on 09/06/2020) 30 tablet 3   No current facility-administered medications for this visit.      Marland Kitchen  PHYSICAL EXAMINATION: ECOG PERFORMANCE STATUS: 1 - Symptomatic but completely ambulatory  Vitals:   09/06/20 1506  BP: 121/90  Pulse: 73  Resp: 20  Temp: (!) 97.3 F (36.3 C)   Filed Weights   09/06/20 1506  Weight: 270 lb (122.5 kg)    Physical Exam Constitutional:       Comments: He is alone.   Walk independently.  HENT:     Head: Normocephalic and atraumatic.     Mouth/Throat:     Pharynx: No oropharyngeal exudate.  Eyes:     Pupils: Pupils are equal, round, and reactive to light.  Cardiovascular:     Rate and Rhythm: Normal rate and regular rhythm.  Pulmonary:     Effort: Pulmonary effort is normal. No respiratory distress.     Breath sounds: Normal breath sounds. No wheezing.  Abdominal:     General: Bowel sounds are normal. There is no distension.     Palpations: Abdomen is soft. There is no mass.     Tenderness: There is no abdominal tenderness. There is no guarding or rebound.  Musculoskeletal:        General: No tenderness. Normal range of motion.     Cervical back: Normal range of motion and neck supple.  Skin:    General: Skin is warm.  Neurological:     Mental Status: He is alert and oriented to person, place, and time.  Psychiatric:        Mood and Affect: Affect normal.      LABORATORY DATA:  I have reviewed the data as listed Lab Results  Component Value Date   WBC 5.1 09/06/2020   HGB 11.6 (L) 09/06/2020   HCT 34.1 (L) 09/06/2020   MCV 89.7 09/06/2020   PLT 218 09/06/2020   Recent Labs    04/24/20 0831 05/01/20 0843 05/08/20 0828 05/15/20 0835 05/22/20 0845 06/07/20 1400 09/06/20 1444  NA 138 137 137   < > 138 141 137  K 3.8 4.0 4.0   < > 3.8 4.1 3.9  CL 104 107 106   < > 107 107 103  CO2 25 25 25    < > 24 27 25   GLUCOSE 100* 99 105*   < > 107* 107* 119*  BUN 14 21* 21*   < > 19 18 22*  CREATININE 1.01 1.01 0.94   < > 1.06 1.03 0.99  CALCIUM 8.8* 8.7* 8.9   < > 9.1 9.3 9.0  GFRNONAA >60 >60 >60   < > >60 >60 >60  GFRAA >60 >60 >60  --   --   --   --   PROT 7.9 7.9 8.2*   < > 8.3* 8.1 7.8  ALBUMIN 3.6 3.6 3.8   < > 3.9 4.1 3.9  AST 31 38 35   < > 34 24 21  ALT 27 31 28    < > 29 19 18   ALKPHOS 187* 159* 141*   < > 138* 114 148*  BILITOT 0.6 0.5 0.7   < > 0.8 0.6 0.7   < > = values in this interval not  displayed.    RADIOGRAPHIC STUDIES: I have personally reviewed the radiological images as listed and agreed with the findings in the report. No results found.  ASSESSMENT & PLAN:  Rectal cancer ([place]) #Rectal adenocarcinoma- moderate differentiated; stage III [multiple pelvic lymph nodes]; ? borderline 9 mm retroperitoneal LN.  S/P total neoadjuvant chemotherapy/radiation therapy-s/p APR- JAN 2022 [[facility]]- pathologic CR.  #I reviewed the pathologic findings with the patient and family in detail.  Obviously complete pathologic response is an excellent prognostic marker.  However would recommend close monitoring-with surveillance imaging in 6 months.  Also recommend repeat colonoscopy within a year of diagnosis of rectal cancer.  Recommend follow-up with Dr. Vicente Males. Discussed with Dr.Byrnett.   # PN-cold sensistivity-G-1-STABLE.   #IV access: Mediport in place.  Discussed the pros and cons of continued Mediport.  Plan keeping it for now; flush every 3 months.  #Erectile dysfunction-question related to recent surgery.  We will make a referral to urology.  DISPOSITION:  # refer to [facility] urology- Dx; ED # port flush in 3 months # follow up in 6 months- PORT FLUSH; cbc/cmp/CEA; CT C/A/P-Dr.B    All questions were answered. The patient knows to call the clinic with any problems, questions or concerns.    Cammie Sickle, MD 09/08/2020 8:12 AM

## 2020-09-07 LAB — CEA: CEA: 1 ng/mL (ref 0.0–4.7)

## 2020-09-12 ENCOUNTER — Encounter: Payer: Self-pay | Admitting: Urology

## 2020-09-12 ENCOUNTER — Other Ambulatory Visit: Payer: Self-pay

## 2020-09-12 ENCOUNTER — Ambulatory Visit (INDEPENDENT_AMBULATORY_CARE_PROVIDER_SITE_OTHER): Payer: 59 | Admitting: Urology

## 2020-09-12 VITALS — BP 145/84 | HR 86 | Ht 74.0 in | Wt 270.4 lb

## 2020-09-12 DIAGNOSIS — N529 Male erectile dysfunction, unspecified: Secondary | ICD-10-CM | POA: Diagnosis not present

## 2020-09-12 MED ORDER — TADALAFIL 5 MG PO TABS: 5.0000 mg | ORAL_TABLET | Freq: Every day | ORAL | 11 refills | Status: DC

## 2020-09-12 NOTE — Patient Instructions (Signed)
Erectile Dysfunction Erectile dysfunction (ED) is the inability to get or keep an erection in order to have sexual intercourse. ED is considered a symptom of an underlying disorder and not considered a disease. Erectile dysfunction may include:  Inability to get an erection.  Lack of enough hardness of the erection to allow penetration.  Loss of the erection before sex is finished. What are the causes? This condition may be caused by:  Certain medicines, such as: ? Pain relievers. ? Antihistamines. ? Antidepressants. ? Blood pressure medicines. ? Water pills (diuretics). ? Ulcer medicines. ? Muscle relaxants. ? Drugs.  Excessive drinking.  Psychological causes, such as: ? Anxiety. ? Depression. ? Sadness. ? Exhaustion. ? Performance fear. ? Stress.  Physical causes, such as: ? Artery problems. This may include diabetes, smoking, liver disease, or atherosclerosis. ? High blood pressure. ? Hormonal problems, such as low testosterone. ? Obesity. ? Nerve problems. This may include back or pelvic injuries, diabetes mellitus, multiple sclerosis, or Parkinson's disease. What are the signs or symptoms? Symptoms of this condition include:  Inability to get an erection.  Lack of enough hardness of the erection to allow penetration.  Loss of the erection before sex is finished.  Normal erections at some times, but with frequent unsatisfactory episodes.  Low sexual satisfaction in either partner due to erection problems.  A curved penis occurring with erection. The curve may cause pain or the penis may be too curved to allow for intercourse.  Never having nighttime erections. How is this diagnosed? This condition is often diagnosed by:  Performing a physical exam to find other diseases or specific problems with the penis.  Asking you detailed questions about the problem.  Performing blood tests to check for diabetes mellitus or to measure hormone levels.  Performing  other tests to check for underlying health conditions.  Performing an ultrasound exam to check for scarring.  Performing a test to check blood flow to the penis.  Doing a sleep study at home to measure nighttime erections. How is this treated? This condition may be treated by:  Medicine taken by mouth to help you achieve an erection (oral medicine).  Hormone replacement therapy to replace low testosterone levels.  Medicine that is injected into the penis. Your health care provider may instruct you how to give yourself these injections at home.  Vacuum pump. This is a pump with a ring on it. The pump and ring are placed on the penis and used to create pressure that helps the penis become erect.  Penile implant surgery. In this procedure, you may receive: ? An inflatable implant. This consists of cylinders, a pump, and a reservoir. The cylinders can be inflated with a fluid that helps to create an erection, and they can be deflated after intercourse. ? A semi-rigid implant. This consists of two silicone rubber rods. The rods provide some rigidity. They are also flexible, so the penis can both curve downward in its normal position and become straight for sexual intercourse.  Blood vessel surgery, to improve blood flow to the penis. During this procedure, a blood vessel from a different part of the body is placed into the penis to allow blood to flow around (bypass) damaged or blocked blood vessels.  Lifestyle changes, such as exercising more, losing weight, and quitting smoking. Follow these instructions at home: Medicines  Take over-the-counter and prescription medicines only as told by your health care provider. Do not increase the dosage without first discussing it with your health care   provider.  If you are using self-injections, perform injections as directed by your health care provider. Make sure to avoid any veins that are on the surface of the penis. After giving an injection,  apply pressure to the injection site for 5 minutes.   General instructions  Exercise regularly, as directed by your health care provider. Work with your health care provider to lose weight, if needed.  Do not use any products that contain nicotine or tobacco, such as cigarettes and e-cigarettes. If you need help quitting, ask your health care provider.  Before using a vacuum pump, read the instructions that come with the pump and discuss any questions with your health care provider.  Keep all follow-up visits as told by your health care provider. This is important. Contact a health care provider if:  You feel nauseous.  You vomit. Get help right away if:  You are taking oral or injectable medicines and you have an erection that lasts longer than 4 hours. If your health care provider is unavailable, go to the nearest emergency room for evaluation. An erection that lasts much longer than 4 hours can result in permanent damage to your penis.  You have severe pain in your groin or abdomen.  You develop redness or severe swelling of your penis.  You have redness spreading up into your groin or lower abdomen.  You are unable to urinate.  You experience chest pain or a rapid heart beat (palpitations) after taking oral medicines. Summary  Erectile dysfunction (ED) is the inability to get or keep an erection during sexual intercourse. This problem can usually be treated successfully.  This condition is diagnosed based on a physical exam, your symptoms, and tests to determine the cause. Treatment varies depending on the cause and may include medicines, hormone therapy, surgery, or a vacuum pump.  You may need follow-up visits to make sure that you are using your medicines or devices correctly.  Get help right away if you are taking or injecting medicines and you have an erection that lasts longer than 4 hours. This information is not intended to replace advice given to you by your health  care provider. Make sure you discuss any questions you have with your health care provider. Document Revised: 04/07/2020 Document Reviewed: 04/07/2020 Elsevier Patient Education  2021 [employer].  Tadalafil tablets (Cialis) What is this medicine? TADALAFIL (tah DA la fil) is used to treat erection problems in men. It is also used for enlargement of the prostate gland in men, a condition called benign prostatic hyperplasia or BPH. This medicine improves urine flow and reduces BPH symptoms. This medicine can also treat both erection problems and BPH when they occur together. This medicine may be used for other purposes; ask your health care provider or pharmacist if you have questions. COMMON BRAND NAME(S): Kathaleen Bury, Cialis What should I tell my health care provider before I take this medicine? They need to know if you have any of these conditions:  bleeding disorders  eye or vision problems, including a rare inherited eye disease called retinitis pigmentosa  anatomical deformation of the penis, Peyronie's disease, or history of priapism (painful and prolonged erection)  heart disease, angina, a history of heart attack, irregular heart beats, or other heart problems  high or low blood pressure  history of blood diseases, like sickle cell anemia or leukemia  history of stomach bleeding  kidney disease  liver disease  stroke  an unusual or allergic reaction to tadalafil, other medicines,  foods, dyes, or preservatives  pregnant or trying to get pregnant  breast-feeding How should I use this medicine? Take this medicine by mouth with a glass of water. Follow the directions on the prescription label. You may take this medicine with or without meals. When this medicine is used for erection problems, your doctor may prescribe it to be taken once daily or as needed. If you are taking the medicine as needed, you may be able to have sexual activity 30 minutes after taking it and for  up to 36 hours after taking it. Whether you are taking the medicine as needed or once daily, you should not take more than one dose per day. If you are taking this medicine for symptoms of benign prostatic hyperplasia (BPH) or to treat both BPH and an erection problem, take the dose once daily at about the same time each day. Do not take your medicine more often than directed. Talk to your pediatrician regarding the use of this medicine in children. Special care may be needed. Overdosage: If you think you have taken too much of this medicine contact a poison control center or emergency room at once. NOTE: This medicine is only for you. Do not share this medicine with others. What if I miss a dose? If you are taking this medicine as needed for erection problems, this does not apply. If you miss a dose while taking this medicine once daily for an erection problem, benign prostatic hyperplasia, or both, take it as soon as you remember, but do not take more than one dose per day. What may interact with this medicine? Do not take this medicine with any of the following medications:  nitrates like amyl nitrite, isosorbide dinitrate, isosorbide mononitrate, nitroglycerin  other medicines for erectile dysfunction like avanafil, sildenafil, vardenafil  other tadalafil products (Adcirca)  riociguat This medicine may also interact with the following medications:  certain drugs for high blood pressure  certain drugs for the treatment of HIV infection or AIDS  certain drugs used for fungal or yeast infections, like fluconazole, itraconazole, ketoconazole, and voriconazole  certain drugs used for seizures like carbamazepine, phenytoin, and phenobarbital  grapefruit juice  macrolide antibiotics like clarithromycin, erythromycin, troleandomycin  medicines for prostate problems  rifabutin, rifampin or rifapentine This list may not describe all possible interactions. Give your health care provider a  list of all the medicines, herbs, non-prescription drugs, or dietary supplements you use. Also tell them if you smoke, drink alcohol, or use illegal drugs. Some items may interact with your medicine. What should I watch for while using this medicine? If you notice any changes in your vision while taking this drug, call your doctor or health care professional as soon as possible. Stop using this medicine and call your health care provider right away if you have a loss of sight in one or both eyes. Contact your doctor or health care professional right away if the erection lasts longer than 4 hours or if it becomes painful. This may be a sign of serious problem and must be treated right away to prevent permanent damage. If you experience symptoms of nausea, dizziness, chest pain or arm pain upon initiation of sexual activity after taking this medicine, you should refrain from further activity and call your doctor or health care professional as soon as possible. Do not drink alcohol to excess (examples, 5 glasses of wine or 5 shots of whiskey) when taking this medicine. When taken in excess, alcohol can increase your chances  of getting a headache or getting dizzy, increasing your heart rate or lowering your blood pressure. Using this medicine does not protect you or your partner against HIV infection (the virus that causes AIDS) or other sexually transmitted diseases. What side effects may I notice from receiving this medicine? Side effects that you should report to your doctor or health care professional as soon as possible:  allergic reactions like skin rash, itching or hives, swelling of the face, lips, or tongue  breathing problems  changes in hearing  changes in vision  chest pain  fast, irregular heartbeat  prolonged or painful erection  seizures Side effects that usually do not require medical attention (report to your doctor or health care professional if they continue or are  bothersome):  back pain  dizziness  flushing  headache  indigestion  muscle aches  nausea  stuffy or runny nose This list may not describe all possible side effects. Call your doctor for medical advice about side effects. You may report side effects to FDA at 1-800-FDA-1088. Where should I keep my medicine? Keep out of the reach of children. Store at room temperature between 15 and 30 degrees C (59 and 86 degrees F). Throw away any unused medicine after the expiration date. NOTE: This sheet is a summary. It may not cover all possible information. If you have questions about this medicine, talk to your doctor, pharmacist, or health care provider.  2021 Elsevier/Gold Standard (2013-12-10 13:15:49)

## 2020-09-12 NOTE — Progress Notes (Signed)
   09/12/20 1:59 PM   Raye Sorrow [birth year] [id number]  CC: ED  HPI: I saw Mr. Hammack in urology clinic today for evaluation of erectile dysfunction.  He is a 60 year old male with history notable for rectal cancer treated with chemotherapy and radiation, followed by surgical resection at [facility].  Resection showed no residual cancer, and he is currently under surveillance by Dr. Rogue Bussing with oncology.  He reports that he had some trouble with erections prior to undergoing treatment for his rectal cancer, but since surgery has not had any erections at all.  He has never tried any medications for this.   PMH: Past Medical History:  Diagnosis Date  . Allergic rhinitis   . Heart murmur   . Rectal cancer [facility])     Surgical History: Past Surgical History:  Procedure Laterality Date  . COLONOSCOPY WITH PROPOFOL N/A 12/15/2019   Procedure: COLONOSCOPY WITH PROPOFOL;  Surgeon: [name], MD;  Location: [facility] ENDOSCOPY;  Service: Gastroenterology;  Laterality: N/A;  . PORTACATH PLACEMENT Left 12/29/2019   Procedure: INSERTION PORT-A-CATH;  Surgeon: Robert Bellow, MD;  Location: [facility] ORS;  Service: General;  Laterality: Left;    Family History: Family History  Problem Relation Age of Onset  . Hypertension Mother   . Hypertension Father   . Stomach cancer Father        in 16s- survived.   Marland Kitchen Heart disease Brother     Social History:  reports that he has quit smoking. He has never used smokeless tobacco. He reports previous alcohol use. He reports that he does not use drugs.  Physical Exam: BP (!) 145/84 (BP Location: Left Arm, Patient Position: Sitting, Cuff Size: Large)   Pulse 86   Ht 6\' 2"  (1.88 m)   Wt 270 lb 6.4 oz (122.7 kg)   BMI 34.72 kg/m    Constitutional:  Alert and oriented, No acute distress. Cardiovascular: No clubbing, cyanosis, or edema. Respiratory: Normal respiratory effort, no increased work of breathing. GI: Abdomen is soft, nontender, nondistended, no  abdominal masses  Assessment & Plan:   60 year old male with history of rectal cancer treated with chemotherapy, radiation, and ultimately surgical resection and diversion with no evidence of residual disease. He had some trouble with erections prior to treatment, and has not had any erections since surgery.  We discussed possible etiologies of ED at length, especially in the setting of his radiation and pelvic surgery. We discussed that treatment algorithm is similar with trial of PDE-5 inhibitors first, with options of intracavernosal injections, vacuum erection device, or penile prosthesis in the future if he does not have any improvement. We reviewed the risks and benefits of Cialis, as well as the dosing options " as needed" versus scheduled.  Trial of Cialis 5mg  daily, okay to increase to up to 20 mg RTC 6 weeks symptom check  Nickolas Madrid, MD 09/12/2020  [place] [address] [place], [place] [address] [phone]

## 2020-10-03 ENCOUNTER — Encounter: Payer: Self-pay | Admitting: Radiation Oncology

## 2020-10-04 ENCOUNTER — Encounter: Payer: Self-pay | Admitting: Radiation Oncology

## 2020-10-04 ENCOUNTER — Ambulatory Visit
Admission: RE | Admit: 2020-10-04 | Discharge: 2020-10-04 | Disposition: A | Discharge status: Home / Self Care | Payer: 59 | Source: Ambulatory Visit | Attending: Radiation Oncology | Admitting: Radiation Oncology

## 2020-10-04 ENCOUNTER — Other Ambulatory Visit: Payer: Self-pay

## 2020-10-04 VITALS — BP 133/66 | HR 75 | Temp 97.5°F | Wt 276.0 lb

## 2020-10-04 DIAGNOSIS — C20 Malignant neoplasm of rectum: Secondary | ICD-10-CM

## 2020-10-04 NOTE — Progress Notes (Signed)
Radiation Oncology Follow up Note  Name: Stephen Vasquez   Date:   10/04/2020 MRN:  [id number] DOB: 1960/12/28    This 60 y.o. 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  REFERRING PROVIDER: Volney Vasquez,*  HPI: Patient is a 60 year old male now seen at 3 months having completed concurrent neoadjuvant chemotherapy after TNT FOLFOX chemotherapy for a stage III adenocarcinoma the distal rectum.  Patient had gone on to.  Have resection showing a complete response.  He is not on any adjuvant chemotherapy at this time.  He does have a colostomy which is functioning well.  He is having no significant lower urinary tract symptoms.  He has experienced erectile dysfunction is seeing urology for that.  COMPLICATIONS OF TREATMENT: none  FOLLOW UP COMPLIANCE: keeps appointments   PHYSICAL EXAM:  BP 133/66   Pulse 75   Temp (!) 97.5 F (36.4 C) (Tympanic)   Wt 276 lb (125.2 kg)   BMI 35.44 kg/m  Patient has a functioning colostomy.  Well-developed well-nourished patient in NAD. HEENT reveals PERLA, EOMI, discs not visualized.  Oral cavity is clear. No oral mucosal lesions are identified. Neck is clear without evidence of cervical or supraclavicular adenopathy. Lungs are clear to A&P. Cardiac examination is essentially unremarkable with regular rate and rhythm without murmur rub or thrill. Abdomen is benign with no organomegaly or masses noted. Motor sensory and DTR levels are equal and symmetric in the upper and lower extremities. Cranial nerves II through XII are grossly intact. Proprioception is intact. No peripheral adenopathy or edema is identified. No motor or sensory levels are noted. Crude visual fields are within normal range.  RADIOLOGY RESULTS: CT scan chest abdomen pelvis ordered for August  PLAN: Present time patient is doing well under active surveillance.  He had  a complete response which is excellent prognostically he has a very low side effect profile.  I have asked to see him back in 6 months for follow-up.  Patient knows to call with any concerns.  I would like to take this opportunity to thank you for allowing me to participate in the care of your patient.Stephen Filbert, MD

## 2020-10-19 ENCOUNTER — Telehealth: Payer: Self-pay

## 2020-10-19 NOTE — Telephone Encounter (Signed)
-----   Message from [name], MD sent at 10/13/2020  8:02 AM EST ----- Regarding: RE: follow up colonoscopy Sherald Hess can you arrange surveillance colonoscopy please for rectal cancer  Regards  Kiran  ----- Message ----- From: Cammie Sickle, MD Sent: 10/12/2020   6:58 PM EST To: [name], MD Subject: follow up colonoscopy                          Hi-I would appreciate if your office can follow-up with regards to surveillance colonoscopy within a year of his diagnosis of rectal cancer.  Patient had excellent response from chemotherapy-radiation; and is currently s/p APR.  Thanks, GB

## 2020-10-19 NOTE — Telephone Encounter (Signed)
Called pt to schedule colonoscopy. Unable to contact, lvm to return call.

## 2020-10-24 ENCOUNTER — Ambulatory Visit: Payer: 59 | Admitting: Urology

## 2020-10-30 ENCOUNTER — Other Ambulatory Visit: Payer: Self-pay

## 2020-10-30 ENCOUNTER — Encounter: Payer: Self-pay | Admitting: Urology

## 2020-10-30 ENCOUNTER — Ambulatory Visit (INDEPENDENT_AMBULATORY_CARE_PROVIDER_SITE_OTHER): Payer: 59 | Admitting: Urology

## 2020-10-30 VITALS — BP 131/74 | HR 75 | Ht 74.0 in | Wt 265.0 lb

## 2020-10-30 DIAGNOSIS — N529 Male erectile dysfunction, unspecified: Secondary | ICD-10-CM | POA: Diagnosis not present

## 2020-10-30 NOTE — Progress Notes (Signed)
   10/30/2020 2:22 PM   Stephen Vasquez November 12, 1960 [id number]  Reason for visit: Follow up ED  HPI: 60 year old male with history of rectal cancer treated with chemotherapy, radiation, and ultimately surgical resection and diversion with no evidence of residual disease.  He was having some trouble with erections prior to treatment, and was not having any erections after surgery.  At our last visit we opted for a trial of Cialis 5 mg daily, and he has had excellent results with this medication.  He denies any side effects or problems.  I recommended continuing the daily Cialis, with 1 year follow-up, and if he is doing well at that time can be filled by PCP yearly.  Continue Cialis 5 mg daily RTC 1 year, if doing well at that time Cialis can be refilled by PCP  Billey Co, MD  [place] [address] [place], [place] [address] [phone]

## 2020-12-06 ENCOUNTER — Inpatient Hospital Stay: Payer: 59 | Attending: Internal Medicine

## 2020-12-06 DIAGNOSIS — Z452 Encounter for adjustment and management of vascular access device: Secondary | ICD-10-CM | POA: Insufficient documentation

## 2020-12-06 DIAGNOSIS — Z95828 Presence of other vascular implants and grafts: Secondary | ICD-10-CM

## 2020-12-06 DIAGNOSIS — C20 Malignant neoplasm of rectum: Secondary | ICD-10-CM | POA: Insufficient documentation

## 2020-12-06 MED ORDER — HEPARIN SOD (PORK) LOCK FLUSH 100 UNIT/ML IV SOLN
INTRAVENOUS | Status: AC
  Filled 2020-12-06: qty 5

## 2020-12-06 MED ORDER — SODIUM CHLORIDE 0.9% FLUSH
10.0000 mL | INTRAVENOUS | Status: DC | PRN
  Administered 2020-12-06: 10 mL via INTRAVENOUS
  Filled 2020-12-06: qty 10

## 2020-12-06 MED ORDER — HEPARIN SOD (PORK) LOCK FLUSH 100 UNIT/ML IV SOLN
500.0000 [IU] | Freq: Once | INTRAVENOUS | Status: AC
  Administered 2020-12-06: 500 [IU] via INTRAVENOUS
  Filled 2020-12-06: qty 5

## 2020-12-21 ENCOUNTER — Encounter: Payer: Self-pay | Admitting: *Deleted

## 2021-03-06 ENCOUNTER — Other Ambulatory Visit: Payer: Self-pay

## 2021-03-06 ENCOUNTER — Ambulatory Visit
Admission: RE | Admit: 2021-03-06 | Discharge: 2021-03-06 | Disposition: A | Discharge status: Home / Self Care | Payer: 59 | Source: Ambulatory Visit | Attending: Internal Medicine | Admitting: Internal Medicine

## 2021-03-06 DIAGNOSIS — C20 Malignant neoplasm of rectum: Secondary | ICD-10-CM | POA: Insufficient documentation

## 2021-03-06 MED ORDER — IOHEXOL 350 MG/ML SOLN
100.0000 mL | Freq: Once | INTRAVENOUS | Status: AC | PRN
  Administered 2021-03-06: 100 mL via INTRAVENOUS

## 2021-03-07 ENCOUNTER — Inpatient Hospital Stay: Payer: 59 | Attending: Internal Medicine

## 2021-03-07 ENCOUNTER — Inpatient Hospital Stay (HOSPITAL_BASED_OUTPATIENT_CLINIC_OR_DEPARTMENT_OTHER): Payer: 59 | Admitting: Internal Medicine

## 2021-03-07 ENCOUNTER — Encounter: Payer: Self-pay | Admitting: Internal Medicine

## 2021-03-07 DIAGNOSIS — Z79899 Other long term (current) drug therapy: Secondary | ICD-10-CM | POA: Diagnosis not present

## 2021-03-07 DIAGNOSIS — C20 Malignant neoplasm of rectum: Secondary | ICD-10-CM | POA: Insufficient documentation

## 2021-03-07 DIAGNOSIS — N529 Male erectile dysfunction, unspecified: Secondary | ICD-10-CM | POA: Insufficient documentation

## 2021-03-07 DIAGNOSIS — E669 Obesity, unspecified: Secondary | ICD-10-CM | POA: Diagnosis not present

## 2021-03-07 DIAGNOSIS — Z95828 Presence of other vascular implants and grafts: Secondary | ICD-10-CM

## 2021-03-07 LAB — COMPREHENSIVE METABOLIC PANEL
ALT: 23 U/L (ref 0–44)
AST: 20 U/L (ref 15–41)
Albumin: 4.3 g/dL (ref 3.5–5.0)
Alkaline Phosphatase: 96 U/L (ref 38–126)
Anion gap: 8 (ref 5–15)
BUN: 27 mg/dL — ABNORMAL HIGH (ref 6–20)
CO2: 26 mmol/L (ref 22–32)
Calcium: 9.1 mg/dL (ref 8.9–10.3)
Chloride: 103 mmol/L (ref 98–111)
Creatinine, Ser: 1.26 mg/dL — ABNORMAL HIGH (ref 0.61–1.24)
GFR, Estimated: >60 mL/min (ref >60)
Glucose, Bld: 97 mg/dL (ref 70–99)
Potassium: 3.9 mmol/L (ref 3.5–5.1)
Sodium: 137 mmol/L (ref 135–145)
Total Bilirubin: 0.5 mg/dL (ref 0.3–1.2)
Total Protein: 7.7 g/dL (ref 6.5–8.1)

## 2021-03-07 LAB — CBC WITH DIFFERENTIAL/PLATELET
Abs Immature Granulocytes: 0.02 10*3/uL (ref 0.00–0.07)
Basophils Absolute: 0 10*3/uL (ref 0.0–0.1)
Basophils Relative: 0 %
Eosinophils Absolute: 0.1 10*3/uL (ref 0.0–0.5)
Eosinophils Relative: 2 %
HCT: 40.7 % (ref 39.0–52.0)
Hemoglobin: 13.6 g/dL (ref 13.0–17.0)
Immature Granulocytes: 0 %
Lymphocytes Relative: 17 %
Lymphs Abs: 1.3 10*3/uL (ref 0.7–4.0)
MCH: 29.6 pg (ref 26.0–34.0)
MCHC: 33.4 g/dL (ref 30.0–36.0)
MCV: 88.5 fL (ref 80.0–100.0)
Monocytes Absolute: 0.5 10*3/uL (ref 0.1–1.0)
Monocytes Relative: 7 %
Neutro Abs: 5.4 10*3/uL (ref 1.7–7.7)
Neutrophils Relative %: 74 %
Platelets: 251 10*3/uL (ref 150–400)
RBC: 4.6 MIL/uL (ref 4.22–5.81)
RDW: 12.9 % (ref 11.5–15.5)
WBC: 7.3 10*3/uL (ref 4.0–10.5)
nRBC: 0 % (ref 0.0–0.2)

## 2021-03-07 MED ORDER — SODIUM CHLORIDE 0.9% FLUSH
10.0000 mL | INTRAVENOUS | Status: DC | PRN
  Administered 2021-03-07: 10 mL via INTRAVENOUS
  Filled 2021-03-07: qty 10

## 2021-03-07 MED ORDER — HEPARIN SOD (PORK) LOCK FLUSH 100 UNIT/ML IV SOLN
INTRAVENOUS | Status: AC
  Filled 2021-03-07: qty 5

## 2021-03-07 MED ORDER — HEPARIN SOD (PORK) LOCK FLUSH 100 UNIT/ML IV SOLN
500.0000 [IU] | Freq: Once | INTRAVENOUS | Status: AC
  Administered 2021-03-07: 500 [IU] via INTRAVENOUS
  Filled 2021-03-07: qty 5

## 2021-03-07 NOTE — Assessment & Plan Note (Addendum)
#   Rectal adenocarcinoma- moderate differentiated; stage III [multiple pelvic lymph nodes]; ? borderline 9 mm retroperitoneal LN.  S/P total neoadjuvant chemotherapy/radiation therapy-s/p APR- JAN 2022 [[facility]]- pathologic CR. AUG 2nd 2022- CT-. No evidence of new or progressive metastatic disease within the chest, abdomen, or pelvis.  Decreased size of the indexed small aortocaval and left external iliac lymph nodes- ?  Significance.    # Monitor for now; would recommend surveillance imaging in 6 months. Recommend follow-up with Dr. Vicente Males re: continued surveillance colonoscopy-office phone number given. I will inform Dr. Vicente Males.   # PN-cold sensistivity-G-1-STABLE.   #IV access: Mediport in place- STBALE  Discussed the pros and cons of continued Mediport.  Plan keeping it for now; flush every 3 months.   # Erectile dysfunction-question related to recent surgery s/p urology evaluation- Cialis.    #Obesity: Discussed importance of healthy weight/and weight loss.  Strongly recommend eating more green leafy vegetables and cutting down processed food/ carbohydrates.  Instead increasing whole grains / protein in the diet.  Multiple studies have shown that optimal weight would help improve cardiovascular risk; also shown to cut on the risk of malignancies-colon cancer, breast cancer ovarian/uterine cancer in women and also prostate cancer in men.   DISPOSITION:  # port flush in 3 months # follow up in 6 months- PORT FLUSH; cbc/cmp/CEA; CT C/A/P-Dr.B   # I reviewed the blood work- with the patient in detail; also reviewed the imaging independently [as summarized above]; and with the patient in detail.

## 2021-03-07 NOTE — Patient Instructions (Addendum)
#   Recommend call Dr.Anna [office:] re: follow up colonoscopy.  [phone]

## 2021-03-08 ENCOUNTER — Encounter: Payer: Self-pay | Admitting: Internal Medicine

## 2021-03-08 LAB — CEA: CEA: 1.3 ng/mL (ref 0.0–4.7)

## 2021-03-08 NOTE — Progress Notes (Signed)
[place] NOTE  Patient Care Team: Jon Billings, NP as PCP - General Sadiq, Ilene Qua, MD as Referring Physician (Surgery) Noreene Filbert, MD as Referring Physician (Radiation Oncology) Clent Jacks, RN as Oncology Nurse Navigator [name], MD as Consulting Physician (Gastroenterology) Cammie Sickle, MD as Consulting Physician (Internal Medicine) Cammie Sickle, MD as Consulting Physician (Hematology and Oncology)  CHIEF COMPLAINTS/PURPOSE OF CONSULTATION: Rectal cancer  #  Oncology History Overview Note  # MAY 2021-rectal adenocarcinoma ; moderately differentiated; [Dr.Anna]; colonoscopy-partially obstructing circumferential rectal mass-starting at anal verge to proximal. CEA- 19. CT C/A/P- Large mass involving the rectum is identified compatible with primary rectal carcinoma. This has a large, 6.7 cm exophytic component in the left pelvis. Multiple perirectal lymph nodes are identified compatible with metastatic adenopathy; Borderline enlarged aortocaval node measures 0.9 cm; PET-bulky rectal mass; multiple lymph nodes noted; no uptake in the retroperitoneal lymph nodes; MRI rectum- T3N2M0;   Pretreatment CEA 94; TNT  # June 1st 2021- FOLFOX T3N2M0;   Pretreatment CEA 94.  S/p TNT [total neoadjuvant therapy]; currently s/p FOLFOX chemotherapy. CEA- pre-treatment- ~100; CEA- 2.0; also s/p 5FU- CIV M-F; RT [ 9/20-10/25]. JAN 8th, 2022- APR-COMPLETE pathologic response. [Dr.Sadiq; [facility]]  # SURVIVORSHIP:   # GENETICS:   DIAGNOSIS: RECTAL CA  STAGE:   III     ;  GOALS: cure  CURRENT/MOST RECENT THERAPY : Surveillance   Rectal cancer ([place])  12/20/2019 Initial Diagnosis   Rectal cancer ([place])   01/04/2020 - 04/11/2020 Chemotherapy   The patient had dexamethasone (DECADRON) 4 MG tablet, 8 mg, Oral, Daily, 1 of 1 cycle, Start date: --, End date: -- palonosetron (ALOXI) injection 0.25 mg, 0.25 mg, Intravenous,  Once, 8 of 8  cycles Administration: 0.25 mg (01/04/2020), 0.25 mg (01/17/2020), 0.25 mg (01/31/2020), 0.25 mg (02/14/2020), 0.25 mg (02/28/2020), 0.25 mg (03/13/2020), 0.25 mg (04/11/2020) pegfilgrastim (NEULASTA ONPRO KIT) injection 6 mg, 6 mg, Subcutaneous, Once, 2 of 2 cycles Administration: 6 mg (02/02/2020) leucovorin 1,000 mg in dextrose 5 % 250 mL infusion, 1,008 mg, Intravenous,  Once, 8 of 8 cycles Administration: 1,000 mg (01/04/2020), 1,000 mg (01/17/2020), 1,000 mg (01/31/2020), 1,000 mg (02/14/2020), 1,000 mg (02/28/2020), 1,000 mg (03/13/2020), 1,000 mg (04/11/2020) oxaliplatin (ELOXATIN) 200 mg in dextrose 5 % 500 mL chemo infusion, 215 mg, Intravenous,  Once, 8 of 8 cycles Administration: 200 mg (01/04/2020), 200 mg (01/17/2020), 200 mg (01/31/2020), 200 mg (02/14/2020), 200 mg (02/28/2020), 200 mg (03/13/2020), 200 mg (04/11/2020) fluorouracil (ADRUCIL) chemo injection 1,000 mg, 400 mg/m2 = 1,000 mg, Intravenous,  Once, 2 of 2 cycles Administration: 1,000 mg (01/04/2020), 1,000 mg (01/17/2020) fluorouracil (ADRUCIL) 6,050 mg in sodium chloride 0.9 % 129 mL chemo infusion, 2,400 mg/m2 = 6,050 mg, Intravenous, 1 Day/Dose, 8 of 8 cycles Administration: 6,050 mg (01/04/2020), 6,050 mg (01/17/2020), 6,050 mg (01/31/2020), 6,050 mg (02/14/2020), 6,050 mg (02/28/2020), 6,050 mg (03/13/2020), 6,050 mg (04/11/2020)   for chemotherapy treatment.     04/24/2020 -  Chemotherapy   The patient had fluorouracil (ADRUCIL) 2,800 mg in sodium chloride 0.9 % 94 mL chemo infusion, 225 mg/m2/day = 2,800 mg, Intravenous, 5D (120 hours), 5 of 5 cycles Administration: 2,800 mg (04/24/2020), 2,800 mg (05/01/2020), 2,800 mg (05/08/2020), 2,800 mg (05/15/2020), 2,800 mg (05/22/2020)   for chemotherapy treatment.        HISTORY OF PRESENTING ILLNESS:  Stephen Vasquez 60 y.o.  male  With stage III rectal cancer currently s/p TNT-FOLFOX chemotherapy & s/p chemoradiation May 29, 2020-s/p surgery at [facility]  in January 2022-is here today with results of the  surveillance CT scan.  In the interim patient was evaluated by neurology but erectile dysfunction started on Cialis.  Patient denies any significant tingling and numbness in extremities.  Denies any nausea vomiting abdominal pain.  However he has gained significant weight since surgery.  Admits to dietary indiscretion.  Denies any significant tingling or numbness.  Review of Systems  Constitutional:  Positive for malaise/fatigue. Negative for chills, diaphoresis, fever and weight loss.  HENT:  Negative for nosebleeds and sore throat.   Eyes:  Negative for double vision.  Respiratory:  Negative for cough, hemoptysis, sputum production, shortness of breath and wheezing.   Cardiovascular:  Negative for chest pain, palpitations, orthopnea and leg swelling.  Gastrointestinal:  Negative for abdominal pain, constipation, diarrhea, heartburn, melena, nausea and vomiting.  Genitourinary:  Negative for dysuria, frequency and urgency.  Musculoskeletal:  Negative for back pain.  Skin: Negative.  Negative for itching and rash.  Neurological:  Negative for dizziness, focal weakness, weakness and headaches.  Endo/Heme/Allergies:  Does not bruise/bleed easily.  Psychiatric/Behavioral:  Negative for depression. The patient is not nervous/anxious and does not have insomnia.     MEDICAL HISTORY:  Past Medical History:  Diagnosis Date   Allergic rhinitis    Heart murmur    Rectal cancer ([place])     SURGICAL HISTORY: Past Surgical History:  Procedure Laterality Date   COLONOSCOPY WITH PROPOFOL N/A 12/15/2019   Procedure: COLONOSCOPY WITH PROPOFOL;  Surgeon: [name], MD;  Location: [facility] ENDOSCOPY;  Service: Gastroenterology;  Laterality: N/A;   PORTACATH PLACEMENT Left 12/29/2019   Procedure: INSERTION PORT-A-CATH;  Surgeon: Robert Bellow, MD;  Location: [facility] ORS;  Service: General;  Laterality: Left;    SOCIAL HISTORY: Social History   Socioeconomic History   Marital status: Married     Spouse name: Not on file   Number of children: Not on file   Years of education: Not on file   Highest education level: Not on file  Occupational History   Not on file  Tobacco Use   Smoking status: Former   Smokeless tobacco: Never  Vaping Use   Vaping Use: Never used  Substance and Sexual Activity   Alcohol use: Not Currently   Drug use: Never   Sexual activity: Yes  Other Topics Concern   Not on file  Social History Narrative   Lives in [place]; Freight forwarder at [employer]; 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 2 children- in boy and girls in late 62s.    Social Determinants of Health   Financial Resource Strain: Not on file  Food Insecurity: Not on file  Transportation Needs: Not on file  Physical Activity: Not on file  Stress: Not on file  Social Connections: Not on file  Intimate Partner Violence: Not on file    FAMILY HISTORY: Family History  Problem Relation Age of Onset   Hypertension Mother    Hypertension Father    Stomach cancer Father        in 26s- survived.    Heart disease Brother     ALLERGIES:  has No Known Allergies.  MEDICATIONS:  Current Outpatient Medications  Medication Sig Dispense Refill   Acetaminophen (TYLENOL EXTRA STRENGTH PO) Take 1 tablet by mouth every 8 (eight) hours as needed (pain).     azelastine (ASTELIN) 0.1 % nasal spray Place 2 sprays into both nostrils 2 (two) times daily. Use in each nostril as directed 30 mL 12  lidocaine-prilocaine (EMLA) cream Apply 1 application topically as needed (port access). 30 g 3   montelukast (SINGULAIR) 10 MG tablet Take 1 tablet (10 mg total) by mouth at bedtime. 30 tablet 3   oxymetazoline (AFRIN) 0.05 % nasal spray Place 1 spray into both nostrils 2 (two) times daily as needed for congestion.     tadalafil (CIALIS) 5 MG tablet Take 1 tablet (5 mg total) by mouth daily. 30 tablet 11   No current facility-administered medications for this visit.      Marland Kitchen  PHYSICAL EXAMINATION: ECOG  PERFORMANCE STATUS: 1 - Symptomatic but completely ambulatory  Vitals:   03/07/21 1422  BP: 117/82  Pulse: 76  Resp: 16  Temp: 98.7 F (37.1 C)  SpO2: 99%   Filed Weights   03/07/21 1422  Weight: 288 lb 3.2 oz (130.7 kg)    Physical Exam Constitutional:      Comments: He is alone.   Walk independently.  HENT:     Head: Normocephalic and atraumatic.     Mouth/Throat:     Pharynx: No oropharyngeal exudate.  Eyes:     Pupils: Pupils are equal, round, and reactive to light.  Cardiovascular:     Rate and Rhythm: Normal rate and regular rhythm.  Pulmonary:     Effort: Pulmonary effort is normal. No respiratory distress.     Breath sounds: Normal breath sounds. No wheezing.  Abdominal:     General: Bowel sounds are normal. There is no distension.     Palpations: Abdomen is soft. There is no mass.     Tenderness: There is no abdominal tenderness. There is no guarding or rebound.     Comments: Positive for colostomy.  Musculoskeletal:        General: No tenderness. Normal range of motion.     Cervical back: Normal range of motion and neck supple.  Skin:    General: Skin is warm.  Neurological:     Mental Status: He is alert and oriented to person, place, and time.  Psychiatric:        Mood and Affect: Affect normal.     LABORATORY DATA:  I have reviewed the data as listed Lab Results  Component Value Date   WBC 7.3 03/07/2021   HGB 13.6 03/07/2021   HCT 40.7 03/07/2021   MCV 88.5 03/07/2021   PLT 251 03/07/2021   Recent Labs    04/24/20 0831 05/01/20 0843 05/08/20 0828 05/15/20 0835 06/07/20 1400 09/06/20 1444 03/07/21 1354  NA 138 137 137   < > 141 137 137  K 3.8 4.0 4.0   < > 4.1 3.9 3.9  CL 104 107 106   < > 107 103 103  CO2 25 25 25    < > 27 25 26   GLUCOSE 100* 99 105*   < > 107* 119* 97  BUN 14 21* 21*   < > 18 22* 27*  CREATININE 1.01 1.01 0.94   < > 1.03 0.99 1.26*  CALCIUM 8.8* 8.7* 8.9   < > 9.3 9.0 9.1  GFRNONAA >60 >60 >60   < > >60 >60 >60   GFRAA >60 >60 >60  --   --   --   --   PROT 7.9 7.9 8.2*   < > 8.1 7.8 7.7  ALBUMIN 3.6 3.6 3.8   < > 4.1 3.9 4.3  AST 31 38 35   < > 24 21 20   ALT 27 31 28    < > 19  18 23  ALKPHOS 187* 159* 141*   < > 114 148* 96  BILITOT 0.6 0.5 0.7   < > 0.6 0.7 0.5   < > = values in this interval not displayed.    RADIOGRAPHIC STUDIES: I have personally reviewed the radiological images as listed and agreed with the findings in the report. CT CHEST ABDOMEN PELVIS W CONTRAST  Result Date: 03/06/2021 CLINICAL DATA:  Colorectal cancer, surveillance/monitoring. Status post surgical resection with colostomy, as well as chemo and radiation therapy. EXAM: CT CHEST, ABDOMEN, AND PELVIS WITH CONTRAST TECHNIQUE: Multidetector CT imaging of the chest, abdomen and pelvis was performed following the standard protocol during bolus administration of intravenous contrast. CONTRAST:  151m OMNIPAQUE IOHEXOL 350 MG/ML SOLN COMPARISON:  Multiple priors including most recent CT and MRI July 03, 2020. FINDINGS: CT CHEST FINDINGS Cardiovascular: Left chest Port-A-Cath with tip at the superior cavoatrial junction. Aortic atherosclerosis without aneurysmal dilation. No central pulmonary embolus. Normal caliber central pulmonary arteries. Coronary artery calcifications. Normal size heart. No significant pericardial effusion/thickening. Mediastinum/Nodes: No supraclavicular adenopathy. No discrete thyroid nodule. No pathologically enlarged mediastinal, hilar or axillary lymph nodes. The trachea esophagus are unremarkable. Lungs/Pleura: No suspicious pulmonary nodules or masses. No pleural effusion. No pneumothorax. No focal airspace consolidation. Musculoskeletal: No chest wall mass or suspicious bone lesions identified. CT ABDOMEN PELVIS FINDINGS Hepatobiliary: No suspicious hepatic lesion. Gallbladder is unremarkable. No biliary ductal dilation. Pancreas: Within normal limits. Spleen: Within normal limits. Adrenals/Urinary Tract:  Adrenal glands are unremarkable. Kidneys are normal, without renal calculi, solid enhancing lesion, or hydronephrosis. Bladder is unremarkable for degree of distension. Stomach/Bowel: Radiopaque enteric contrast traverses the ascending colon. Postsurgical change of partial left colectomy with left anterior abdominal wall colostomy. Stomach is unremarkable. No pathologic dilation of small bowel. Appendix and terminal ileum appear normal. Vascular/Lymphatic: Aortic atherosclerosis without aneurysmal dilation. Decreased size of the indexed small aortocaval lymph node which now measures 3 mm on image 84/2 previously 8 mm. Decreased size of the index left external iliac lymph node which now measures 3 mm on image 112/2 previously 6 mm. No new or progressive adenopathy in the abdomen or pelvis. Reproductive: Prostate is unremarkable. Other: Similar mild presacral soft tissue stranding/thickening, likely post treatment change. No abdominopelvic ascites. No discrete peritoneal or omental nodularity. Musculoskeletal: Multilevel degenerative changes spine. No aggressive lytic or blastic lesion of bone. IMPRESSION: 1. No evidence of new or progressive metastatic disease within the chest, abdomen, or pelvis. 2. Decreased size of the indexed small aortocaval and left external iliac lymph nodes. 3. Postsurgical change of partial left colectomy and left anterior abdominal wall colostomy, with mild presacral soft tissue stranding/thickening, likely post treatment change. 4.  Aortic Atherosclerosis (ICD10-I70.0). Electronically Signed   By: JDahlia BailiffMD   On: 03/06/2021 15:05    ASSESSMENT & PLAN:   [place] Rect[place] adenocarcinoma- moderate differentiated; stage III [multiple pelvic lymph nodes]; ? borderline 9 mm retroperitoneal LN.  S/P total neoadjuvant chemotherapy/radiation therapy-s/p APR- JAN 2022 [[facility]]- pathologic CR. AUG 2nd 2022- CT-. No evidence of new or progressive metastatic disease within the  chest, abdomen, or pelvis.  Decreased size of the indexed small aortocaval and left external iliac lymph nodes- ?  Significance.    # Monitor for now; would recommend surveillance imaging in 6 months. Recommend follow-up with Dr. AVicente Malesre: continued surveillance colonoscopy-office phone number given. I will inform Dr. AVicente Males   # PN-cold sensistivity-G-1-STABLE.   #IV access: Mediport in place- STBALE  Discussed the pros  and cons of continued Mediport.  Plan keeping it for now; flush every 3 months.   # Erectile dysfunction-question related to recent surgery s/p urology evaluation- Cialis.    #Obesity: Discussed importance of healthy weight/and weight loss.  Strongly recommend eating more green leafy vegetables and cutting down processed food/ carbohydrates.  Instead increasing whole grains / protein in the diet.  Multiple studies have shown that optimal weight would help improve cardiovascular risk; also shown to cut on the risk of malignancies-colon cancer, breast cancer ovarian/uterine cancer in women and also prostate cancer in men.   DISPOSITION:  # port flush in 3 months # follow up in 6 months- PORT FLUSH; cbc/cmp/CEA; CT C/A/P-Dr.B   # I reviewed the blood work- with the patient in detail; also reviewed the imaging independently [as summarized above]; and with the patient in detail.     All questions were answered. The patient knows to call the clinic with any problems, questions or concerns.    Cammie Sickle, MD 03/08/2021 7:43 AM

## 2021-03-09 ENCOUNTER — Other Ambulatory Visit: Payer: Self-pay

## 2021-03-09 DIAGNOSIS — C20 Malignant neoplasm of rectum: Secondary | ICD-10-CM

## 2021-03-09 MED ORDER — CLENPIQ 10-3.5-12 MG-GM -GM/160ML PO SOLN: ORAL | 0 refills | Status: AC

## 2021-03-21 ENCOUNTER — Encounter: Payer: Self-pay | Admitting: Gastroenterology

## 2021-03-21 ENCOUNTER — Ambulatory Visit: Payer: 59 | Admitting: Anesthesiology

## 2021-03-21 ENCOUNTER — Other Ambulatory Visit: Payer: Self-pay

## 2021-03-21 ENCOUNTER — Ambulatory Visit
Admission: RE | Admit: 2021-03-21 | Discharge: 2021-03-21 | Disposition: A | Discharge status: Home / Self Care | Payer: 59 | Attending: Gastroenterology | Admitting: Gastroenterology

## 2021-03-21 ENCOUNTER — Encounter
Admission: RE | Disposition: A | Discharge status: Home / Self Care | Payer: Self-pay | Source: Home / Self Care | Attending: Gastroenterology

## 2021-03-21 DIAGNOSIS — Z933 Colostomy status: Secondary | ICD-10-CM | POA: Insufficient documentation

## 2021-03-21 DIAGNOSIS — C20 Malignant neoplasm of rectum: Secondary | ICD-10-CM | POA: Diagnosis not present

## 2021-03-21 DIAGNOSIS — Z79899 Other long term (current) drug therapy: Secondary | ICD-10-CM | POA: Insufficient documentation

## 2021-03-21 DIAGNOSIS — Z87891 Personal history of nicotine dependence: Secondary | ICD-10-CM | POA: Insufficient documentation

## 2021-03-21 DIAGNOSIS — Z800 Family history of malignant neoplasm of digestive organs: Secondary | ICD-10-CM | POA: Diagnosis not present

## 2021-03-21 DIAGNOSIS — Z08 Encounter for follow-up examination after completed treatment for malignant neoplasm: Secondary | ICD-10-CM | POA: Insufficient documentation

## 2021-03-21 DIAGNOSIS — Z85048 Personal history of other malignant neoplasm of rectum, rectosigmoid junction, and anus: Secondary | ICD-10-CM | POA: Diagnosis not present

## 2021-03-21 HISTORY — PX: COLONOSCOPY WITH PROPOFOL: SHX5780

## 2021-03-21 SURGERY — COLONOSCOPY WITH PROPOFOL
Anesthesia: General

## 2021-03-21 MED ORDER — SODIUM CHLORIDE 0.9 % IV SOLN: INTRAVENOUS | Status: DC

## 2021-03-21 MED ORDER — PROPOFOL 10 MG/ML IV BOLUS
INTRAVENOUS | Status: DC | PRN
  Administered 2021-03-21: 70 mg via INTRAVENOUS

## 2021-03-21 MED ORDER — LIDOCAINE HCL (CARDIAC) PF 100 MG/5ML IV SOSY
PREFILLED_SYRINGE | INTRAVENOUS | Status: DC | PRN
  Administered 2021-03-21: 50 mg via INTRAVENOUS

## 2021-03-21 MED ORDER — GOLYTELY 236 G PO SOLR: 4.0000 L | Freq: Once | ORAL | 0 refills | Status: AC

## 2021-03-21 MED ORDER — LIDOCAINE HCL (PF) 2 % IJ SOLN
INTRAMUSCULAR | Status: AC
  Filled 2021-03-21: qty 5

## 2021-03-21 MED ORDER — PROPOFOL 500 MG/50ML IV EMUL
INTRAVENOUS | Status: DC | PRN
  Administered 2021-03-21: 175 ug/kg/min via INTRAVENOUS

## 2021-03-21 MED ORDER — PROPOFOL 500 MG/50ML IV EMUL
INTRAVENOUS | Status: AC
  Filled 2021-03-21: qty 50

## 2021-03-21 NOTE — Anesthesia Preprocedure Evaluation (Signed)
Anesthesia Evaluation  Patient identified by MRN, date of birth, ID band Patient awake    Reviewed: Allergy & Precautions, NPO status , Patient's Chart, lab work & pertinent test results  History of Anesthesia Complications Negative for: history of anesthetic complications  Airway Mallampati: II  TM Distance: >3 FB Neck ROM: Full    Dental no notable dental hx. (+) Teeth Intact   Pulmonary neg pulmonary ROS, neg sleep apnea, neg COPD, Patient abstained from smoking.Not current smoker, former smoker,    Pulmonary exam normal breath sounds clear to auscultation       Cardiovascular Exercise Tolerance: Good METS(-) hypertension(-) CAD and (-) Past MI negative cardio ROS  (-) dysrhythmias  Rhythm:Regular Rate:Normal - Systolic murmurs    Neuro/Psych negative neurological ROS  negative psych ROS   GI/Hepatic neg GERD  ,(+)     (-) substance abuse  , Rectal CA s/p resection   Endo/Other  neg diabetes  Renal/GU negative Renal ROS     Musculoskeletal   Abdominal   Peds  Hematology   Anesthesia Other Findings Past Medical History: No date: Allergic rhinitis No date: Heart murmur No date: Rectal cancer (HCC)  Reproductive/Obstetrics                             Anesthesia Physical Anesthesia Plan  ASA: 2  Anesthesia Plan: General   Post-op Pain Management:    Induction: Intravenous  PONV Risk Score and Plan: 2 and Ondansetron, Propofol infusion and TIVA  Airway Management Planned: Nasal Cannula  Additional Equipment: None  Intra-op Plan:   Post-operative Plan:   Informed Consent: I have reviewed the patients History and Physical, chart, labs and discussed the procedure including the risks, benefits and alternatives for the proposed anesthesia with the patient or authorized representative who has indicated his/her understanding and acceptance.     Dental advisory given  Plan  Discussed with: CRNA and Surgeon  Anesthesia Plan Comments: (Discussed risks of anesthesia with patient, including possibility of difficulty with spontaneous ventilation under anesthesia necessitating airway intervention, PONV, and rare risks such as cardiac or respiratory or neurological events, and allergic reactions. Patient understands.)        Anesthesia Quick Evaluation

## 2021-03-21 NOTE — Anesthesia Procedure Notes (Signed)
Date/Time: 03/21/2021 12:55 PM Performed by: Johnna Acosta, CRNA Pre-anesthesia Checklist: Patient identified, Emergency Drugs available, Suction available, Patient being monitored and Timeout performed Patient Re-evaluated:Patient Re-evaluated prior to induction Oxygen Delivery Method: Nasal cannula Preoxygenation: Pre-oxygenation with 100% oxygen Induction Type: IV induction

## 2021-03-21 NOTE — Op Note (Signed)
[facility] Patient Name: Stephen Vasquez Procedure Date: 03/21/2021 12:43 PM MRN: [id number] Account #: [id number] Date of Birth: Mar 27, 1961 Admit Type: Outpatient Age: 60 Room: [facility] ENDO ROOM 3 Gender: Male Note Status: Finalized Procedure:             Colonoscopy Indications:           High risk colon cancer surveillance: Personal history                         of rectal cancer, Last colonoscopy: May 2021 Providers:             [name] MD, MD Referring MD:          No Local Md, MD (Referring MD) Medicines:             Monitored Anesthesia Care Complications:         No immediate complications. Procedure:             Pre-Anesthesia Assessment:                        - Prior to the procedure, a History and Physical was                         performed, and patient medications, allergies and                         sensitivities were reviewed. The patient's tolerance                         of previous anesthesia was reviewed.                        - The risks and benefits of the procedure and the                         sedation options and risks were discussed with the                         patient. All questions were answered and informed                         consent was obtained.                        - ASA Grade Assessment: III - A patient with severe                         systemic disease.                        - Prior to the procedure, a History and Physical was                         performed, and patient medications, allergies and                         sensitivities were reviewed. The patient's tolerance                         of  previous anesthesia was reviewed.                        - The risks and benefits of the procedure and the                         sedation options and risks were discussed with the                         patient. All questions were answered and informed                         consent was  obtained.                        After obtaining informed consent, the colonoscope was                         passed under direct vision. Throughout the procedure,                         the patient's blood pressure, pulse, and oxygen                         saturations were monitored continuously. The                         Colonoscope was introduced through the sigmoid                         colostomy and advanced to the the cecum, identified by                         the appendiceal orifice. The colonoscopy was performed                         with ease. The patient tolerated the procedure well.                         The quality of the bowel preparation was inadequate. Findings:      Copious quantities of semi-liquid stool was found in the entire colon,       precluding visualization. Impression:            - Preparation of the colon was inadequate.                        - Stool in the entire examined colon.                        - No specimens collected. Recommendation:        - Discharge patient to home (with escort).                        - Resume previous diet.                        - Continue present medications.                        -  Repeat colonoscopy tomorrow because the bowel                         preparation was suboptimal. Procedure Code(s):     --- Professional ---                        [phone], Colonoscopy through stoma; diagnostic,                         including collection of specimen(s) by brushing or                         washing, when performed (separate procedure) Diagnosis Code(s):     --- Professional ---                        [id number], Personal history of other malignant neoplasm                         of rectum, rectosigmoid junction, and anus CPT copyright 2019 American Medical Association. All rights reserved. The codes documented in this report are preliminary and upon coder review may  be revised to meet current compliance  requirements. [name], MD [name] MD, MD 03/21/2021 1:05:12 PM This report has been signed electronically. Number of Addenda: 0 Note Initiated On: 03/21/2021 12:43 PM Scope Withdrawal Time: 0 hours 3 minutes 14 seconds  Total Procedure Duration: 0 hours 4 minutes 58 seconds  Estimated Blood Loss:  Estimated blood loss: none.      [facility]

## 2021-03-21 NOTE — Anesthesia Postprocedure Evaluation (Signed)
Anesthesia Post Note  Patient: Stephen Vasquez  Procedure(s) Performed: COLONOSCOPY WITH PROPOFOL  Patient location during evaluation: Endoscopy Anesthesia Type: General Level of consciousness: awake and alert Pain management: pain level controlled Vital Signs Assessment: post-procedure vital signs reviewed and stable Respiratory status: spontaneous breathing, nonlabored ventilation, respiratory function stable and patient connected to nasal cannula oxygen Cardiovascular status: blood pressure returned to baseline and stable Postop Assessment: no apparent nausea or vomiting Anesthetic complications: no   No notable events documented.   Last Vitals:  Vitals:   03/21/21 1138 03/21/21 1315  BP: 112/77 (!) 121/55  Pulse: 64 65  Resp: 16 13  Temp: 36.5 C   SpO2: 99% 98%    Last Pain:  Vitals:   03/21/21 1315  TempSrc:   PainSc: 0-No pain                 Arita Miss

## 2021-03-21 NOTE — H&P (Signed)
[name], MD [address] [place], [place], [place], [address] [address], [place], [place], [place], [address] Phone: [phone]  Fax: [phone]  Primary Care Physician:  Jon Billings, NP   Pre-Procedure History & Physical: HPI:  Stephen Vasquez is a 60 y.o. male is here for an colonoscopy.   Past Medical History:  Diagnosis Date   Allergic rhinitis    Heart murmur    Rectal cancer [facility])     Past Surgical History:  Procedure Laterality Date   COLONOSCOPY WITH PROPOFOL N/A 12/15/2019   Procedure: COLONOSCOPY WITH PROPOFOL;  Surgeon: [name], MD;  Location: [facility] ENDOSCOPY;  Service: Gastroenterology;  Laterality: N/A;   COLOSTOMY  08/2020   PORTACATH PLACEMENT Left 12/29/2019   Procedure: INSERTION PORT-A-CATH;  Surgeon: Robert Bellow, MD;  Location: [facility] ORS;  Service: General;  Laterality: Left;    Prior to Admission medications   Medication Sig Start Date End Date Taking? Authorizing Provider  Acetaminophen (TYLENOL EXTRA STRENGTH PO) Take 1 tablet by mouth every 8 (eight) hours as needed (pain).   Yes [provider]  azelastine (ASTELIN) 0.1 % nasal spray Place 2 sprays into both nostrils 2 (two) times daily. Use in each nostril as directed 11/29/19  Yes Volney American, PA-C  montelukast (SINGULAIR) 10 MG tablet Take 1 tablet (10 mg total) by mouth at bedtime. 11/29/19  Yes Volney American, PA-C  Sod Picosulfate-Mag Ox-Cit Acd (CLENPIQ) 10-3.5-12 MG-GM -GM/160ML SOLN Take 1 bottle at 5 PM followed by five 8 oz cups of water and repeat 5 hours before procedure. 03/09/21  Yes [name], MD  lidocaine-prilocaine (EMLA) cream Apply 1 application topically as needed (port access). 05/08/20   Cammie Sickle, MD  oxymetazoline (AFRIN) 0.05 % nasal spray Place 1 spray into both nostrils 2 (two) times daily as needed for congestion.    [provider]  tadalafil (CIALIS) 5 MG tablet Take 1 tablet (5 mg total) by  mouth daily. 09/12/20   Billey Co, MD    Allergies as of 03/09/2021   (No Known Allergies)    Family History  Problem Relation Age of Onset   Hypertension Mother    Hypertension Father    Stomach cancer Father        in 1s- survived.    Heart disease Brother     Social History   Socioeconomic History   Marital status: Married    Spouse name: Not on file   Number of children: Not on file   Years of education: Not on file   Highest education level: Not on file  Occupational History   Not on file  Tobacco Use   Smoking status: Former   Smokeless tobacco: Never  Vaping Use   Vaping Use: Never used  Substance and Sexual Activity   Alcohol use: Not Currently   Drug use: Never   Sexual activity: Yes  Other Topics Concern   Not on file  Social History Narrative   Lives in [place]; Freight forwarder at [employer]; 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 2 children- in boy and girls in late 26s.    Social Determinants of Health   Financial Resource Strain: Not on file  Food Insecurity: Not on file  Transportation Needs: Not on file  Physical Activity: Not on file  Stress: Not on file  Social Connections: Not on file  Intimate Partner Violence: Not on file    Review of Systems: See HPI, otherwise  negative ROS  Physical Exam: BP 112/77   Pulse 64   Temp 97.7 F (36.5 C) (Temporal)   Resp 16   Ht '6\' 2"'$  (1.88 m)   Wt 127 kg   SpO2 99%   BMI 35.95 kg/m  General:   Alert,  pleasant and cooperative in NAD Head:  Normocephalic and atraumatic. Neck:  Supple; no masses or thyromegaly. Lungs:  Clear throughout to auscultation, normal respiratory effort.    Heart:  +S1, +S2, Regular rate and rhythm, No edema. Abdomen:  Soft, nontender and nondistended. Normal bowel sounds, without guarding, and without rebound.   Neurologic:  Alert and  oriented x4;  grossly normal neurologically.  Impression/Plan: [name] is here for an colonoscopy to be performed for  surveillance due to rectal cancer in 2021 Risks, benefits, limitations, and alternatives regarding  colonoscopy have been reviewed with the patient.  Questions have been answered.  All parties agreeable.   [name], MD  03/21/2021, 12:42 PM

## 2021-03-21 NOTE — Transfer of Care (Signed)
Immediate Anesthesia Transfer of Care Note  Patient: Stephen Vasquez  Procedure(s) Performed: COLONOSCOPY WITH PROPOFOL  Patient Location: PACU  Anesthesia Type:General  Level of Consciousness: awake, alert  and oriented  Airway & Oxygen Therapy: Patient Spontanous Breathing  Post-op Assessment: Report given to RN and Post -op Vital signs reviewed and stable  Post vital signs: Reviewed and stable  Last Vitals:  Vitals Value Taken Time  BP 121/55 03/21/21 1315  Temp    Pulse 65 03/21/21 1315  Resp 13 03/21/21 1315  SpO2 98 % 03/21/21 1315  Vitals shown include unvalidated device data.  Last Pain:  Vitals:   03/21/21 1315  TempSrc:   PainSc: 0-No pain         Complications: No notable events documented.

## 2021-03-22 ENCOUNTER — Ambulatory Visit: Payer: 59 | Admitting: Registered Nurse

## 2021-03-22 ENCOUNTER — Ambulatory Visit
Admission: RE | Admit: 2021-03-22 | Discharge: 2021-03-22 | Disposition: A | Discharge status: Home / Self Care | Payer: 59 | Attending: Gastroenterology | Admitting: Gastroenterology

## 2021-03-22 ENCOUNTER — Encounter: Payer: Self-pay | Admitting: Gastroenterology

## 2021-03-22 ENCOUNTER — Encounter
Admission: RE | Disposition: A | Discharge status: Home / Self Care | Payer: Self-pay | Source: Home / Self Care | Attending: Gastroenterology

## 2021-03-22 DIAGNOSIS — K635 Polyp of colon: Secondary | ICD-10-CM

## 2021-03-22 DIAGNOSIS — D122 Benign neoplasm of ascending colon: Secondary | ICD-10-CM | POA: Diagnosis not present

## 2021-03-22 DIAGNOSIS — Z87891 Personal history of nicotine dependence: Secondary | ICD-10-CM | POA: Diagnosis not present

## 2021-03-22 DIAGNOSIS — D123 Benign neoplasm of transverse colon: Secondary | ICD-10-CM | POA: Diagnosis not present

## 2021-03-22 DIAGNOSIS — Z85048 Personal history of other malignant neoplasm of rectum, rectosigmoid junction, and anus: Secondary | ICD-10-CM | POA: Diagnosis not present

## 2021-03-22 DIAGNOSIS — Z79899 Other long term (current) drug therapy: Secondary | ICD-10-CM | POA: Insufficient documentation

## 2021-03-22 DIAGNOSIS — Z1211 Encounter for screening for malignant neoplasm of colon: Secondary | ICD-10-CM | POA: Diagnosis present

## 2021-03-22 HISTORY — PX: COLONOSCOPY WITH PROPOFOL: SHX5780

## 2021-03-22 SURGERY — COLONOSCOPY WITH PROPOFOL
Anesthesia: General

## 2021-03-22 MED ORDER — SODIUM CHLORIDE 0.9 % IV SOLN: INTRAVENOUS | Status: DC

## 2021-03-22 MED ORDER — PROPOFOL 500 MG/50ML IV EMUL
INTRAVENOUS | Status: AC
  Filled 2021-03-22: qty 50

## 2021-03-22 MED ORDER — PROPOFOL 500 MG/50ML IV EMUL
INTRAVENOUS | Status: DC | PRN
  Administered 2021-03-22: 150 ug/kg/min via INTRAVENOUS

## 2021-03-22 MED ORDER — LIDOCAINE HCL (CARDIAC) PF 100 MG/5ML IV SOSY
PREFILLED_SYRINGE | INTRAVENOUS | Status: DC | PRN
  Administered 2021-03-22: 40 mg via INTRAVENOUS

## 2021-03-22 MED ORDER — PROPOFOL 10 MG/ML IV BOLUS
INTRAVENOUS | Status: DC | PRN
  Administered 2021-03-22: 90 mg via INTRAVENOUS

## 2021-03-22 MED ORDER — DEXMEDETOMIDINE (PRECEDEX) IN NS 20 MCG/5ML (4 MCG/ML) IV SYRINGE
PREFILLED_SYRINGE | INTRAVENOUS | Status: DC | PRN
  Administered 2021-03-22: 12 ug via INTRAVENOUS

## 2021-03-22 NOTE — Anesthesia Preprocedure Evaluation (Signed)
Anesthesia Evaluation  Patient identified by MRN, date of birth, ID band Patient awake    Reviewed: Allergy & Precautions, NPO status , Patient's Chart, lab work & pertinent test results  History of Anesthesia Complications Negative for: history of anesthetic complications  Airway Mallampati: II  TM Distance: >3 FB Neck ROM: Full    Dental no notable dental hx. (+) Teeth Intact   Pulmonary neg pulmonary ROS, neg sleep apnea, neg COPD, Patient abstained from smoking.Not current smoker, former smoker,    Pulmonary exam normal breath sounds clear to auscultation       Cardiovascular Exercise Tolerance: Good METS(-) hypertension(-) CAD and (-) Past MI (-) dysrhythmias + Valvular Problems/Murmurs  Rhythm:Regular Rate:Normal - Systolic murmurs    Neuro/Psych negative neurological ROS  negative psych ROS   GI/Hepatic neg GERD  ,(+)     (-) substance abuse  , Rectal CA s/p resection   Endo/Other  neg diabetes  Renal/GU negative Renal ROS     Musculoskeletal   Abdominal   Peds  Hematology  (+) anemia ,   Anesthesia Other Findings Allergic rhinitis Heart murmur Rectal cancer (HCC)  Reproductive/Obstetrics                             Anesthesia Physical  Anesthesia Plan  ASA: 2  Anesthesia Plan: General   Post-op Pain Management:    Induction: Intravenous  PONV Risk Score and Plan: 2 and Propofol infusion and TIVA  Airway Management Planned: Nasal Cannula and Natural Airway  Additional Equipment: None  Intra-op Plan:   Post-operative Plan:   Informed Consent: I have reviewed the patients History and Physical, chart, labs and discussed the procedure including the risks, benefits and alternatives for the proposed anesthesia with the patient or authorized representative who has indicated his/her understanding and acceptance.     Dental advisory given  Plan Discussed with: CRNA,  Surgeon and Anesthesiologist  Anesthesia Plan Comments: (Discussed risks of anesthesia with patient, including possibility of difficulty with spontaneous ventilation under anesthesia necessitating airway intervention, PONV, and rare risks such as cardiac or respiratory or neurological events, and allergic reactions. Patient understands.)        Anesthesia Quick Evaluation

## 2021-03-22 NOTE — Anesthesia Procedure Notes (Signed)
Date/Time: 03/22/2021 11:02 AM Performed by: Doreen Salvage, CRNA Pre-anesthesia Checklist: Patient identified, Emergency Drugs available, Suction available and Patient being monitored Patient Re-evaluated:Patient Re-evaluated prior to induction Oxygen Delivery Method: Nasal cannula Induction Type: IV induction Dental Injury: Teeth and Oropharynx as per pre-operative assessment  Comments: Nasal cannula with etCO2 monitoring

## 2021-03-22 NOTE — Op Note (Signed)
[facility]astroenter[facility] Patient Name: Stephen Vasquez Procedure Date: 03/22/2021 10:53 AM MRN: [id number] Account #: [id number] Date of Birth: 08-12-1960 Admit Type: Outpatient Age: 60 Room: [facility] ENDO ROOM 3 Gender: Male Note Status: Finalized Procedure:             Colonoscopy Indications:           High risk colon cancer surveillance: Personal history                         of rectal cancer Providers:             [name] MD, MD Referring MD:          Jon Billings (Referring MD) Medicines:             Monitored Anesthesia Care Complications:         No immediate complications. Procedure:             Pre-Anesthesia Assessment:                        - Prior to the procedure, a History and Physical was                         performed, and patient medications, allergies and                         sensitivities were reviewed. The patient's tolerance                         of previous anesthesia was reviewed.                        - The risks and benefits of the procedure and the                         sedation options and risks were discussed with the                         patient. All questions were answered and informed                         consent was obtained.                        - ASA Grade Assessment: II - A patient with mild                         systemic disease.                        After obtaining informed consent, the colonoscope was                         passed under direct vision. Throughout the procedure,                         the patient's blood pressure, pulse, and oxygen                         saturations were monitored continuously. The  Colonoscope was introduced through the sigmoid                         colostomy and advanced to the the cecum, identified by                         the appendiceal orifice. The colonoscopy was performed                         with ease. The patient  tolerated the procedure well.                         The quality of the bowel preparation was excellent. Findings:      A 3 mm polyp was found in the ascending colon. The polyp was sessile.       The polyp was removed with a jumbo cold forceps. Resection and retrieval       were complete.      A 6 mm polyp was found in the transverse colon. The polyp was sessile.       The polyp was removed with a cold snare. Resection and retrieval were       complete.      The exam was otherwise without abnormality on direct and retroflexion       views.      The exam was otherwise without abnormality. Impression:            - One 3 mm polyp in the ascending colon, removed with                         a jumbo cold forceps. Resected and retrieved.                        - One 6 mm polyp in the transverse colon, removed with                         a cold snare. Resected and retrieved. Recommendation:        - Discharge patient to home (with escort).                        - Resume previous diet.                        - Continue present medications.                        - Await pathology results.                        - Repeat colonoscopy in 3 years for surveillance. Procedure Code(s):     --- Professional ---                        [id number], Colonoscopy through stoma; with removal of                         tumor(s), polyp(s), or other lesion(s) by snare                         technique  [id number], 32, Colonoscopy through stoma; with biopsy,                         single or multiple Diagnosis Code(s):     --- Professional ---                        Z85.048, Personal history of other malignant neoplasm                         of rectum, rectosigmoid junction, and anus                        K63.5, Polyp of colon CPT copyright 2019 American Medical Association. All rights reserved. The codes documented in this report are preliminary and upon coder review may  be revised to meet  current compliance requirements. [name], MD [name] MD, MD 03/22/2021 11:23:44 AM This report has been signed electronically. Number of Addenda: 0 Note Initiated On: 03/22/2021 10:53 AM Scope Withdrawal Time: 0 hours 14 minutes 23 seconds  Total Procedure Duration: 0 hours 16 minutes 55 seconds  Estimated Blood Loss:  Estimated blood loss: none. Estimated blood loss: none.      [facility]

## 2021-03-22 NOTE — H&P (Addendum)
[name], MD [address], [place], [place], [place], [address] [address], [place], [place], [place], [address] Phone: [phone]  Fax: [phone]  Primary Care Physician:  Jon Billings, NP   Pre-Procedure History & Physical: HPI:  Stephen Vasquez is a 60 y.o. male is here for an colonoscopy.   Past Medical History:  Diagnosis Date   Allergic rhinitis    Heart murmur    Rectal cancer [facility])     Past Surgical History:  Procedure Laterality Date   COLONOSCOPY WITH PROPOFOL N/A 12/15/2019   Procedure: COLONOSCOPY WITH PROPOFOL;  Surgeon: [name], MD;  Location: [facility] ENDOSCOPY;  Service: Gastroenterology;  Laterality: N/A;   COLOSTOMY  08/2020   PORTACATH PLACEMENT Left 12/29/2019   Procedure: INSERTION PORT-A-CATH;  Surgeon: Robert Bellow, MD;  Location: [facility] ORS;  Service: General;  Laterality: Left;    Prior to Admission medications   Medication Sig Start Date End Date Taking? Authorizing Provider  Acetaminophen (TYLENOL EXTRA STRENGTH PO) Take 1 tablet by mouth every 8 (eight) hours as needed (pain).   Yes [provider]  azelastine (ASTELIN) 0.1 % nasal spray Place 2 sprays into both nostrils 2 (two) times daily. Use in each nostril as directed 11/29/19  Yes Volney American, PA-C  montelukast (SINGULAIR) 10 MG tablet Take 1 tablet (10 mg total) by mouth at bedtime. 11/29/19  Yes Volney American, PA-C  oxymetazoline (AFRIN) 0.05 % nasal spray Place 1 spray into both nostrils 2 (two) times daily as needed for congestion.   Yes [provider]  Sod Picosulfate-Mag Ox-Cit Acd (CLENPIQ) 10-3.5-12 MG-GM -GM/160ML SOLN Take 1 bottle at 5 PM followed by five 8 oz cups of water and repeat 5 hours before procedure. 03/09/21  Yes [name], MD  lidocaine-prilocaine (EMLA) cream Apply 1 application topically as needed (port access). 05/08/20   Cammie Sickle, MD  tadalafil (CIALIS) 5 MG tablet Take 1 tablet (5 mg total) by  mouth daily. 09/12/20   Billey Co, MD    Allergies as of 03/21/2021   (No Known Allergies)    Family History  Problem Relation Age of Onset   Hypertension Mother    Hypertension Father    Stomach cancer Father        in 30s- survived.    Heart disease Brother     Social History   Socioeconomic History   Marital status: Married    Spouse name: Not on file   Number of children: Not on file   Years of education: Not on file   Highest education level: Not on file  Occupational History   Not on file  Tobacco Use   Smoking status: Former   Smokeless tobacco: Never  Vaping Use   Vaping Use: Never used  Substance and Sexual Activity   Alcohol use: Not Currently   Drug use: Never   Sexual activity: Yes  Other Topics Concern   Not on file  Social History Narrative   Lives in [place]; Freight forwarder at [employer]; 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 2 children- in boy and girls in late 30s.    Social Determinants of Health   Financial Resource Strain: Not on file  Food Insecurity: Not on file  Transportation Needs: Not on file  Physical Activity: Not on file  Stress: Not on file  Social Connections: Not on file  Intimate Partner Violence: Not on file    Review of Systems: See HPI, otherwise  negative ROS  Physical Exam: BP 128/79   Pulse 67   Temp 97.6 F (36.4 C) (Temporal)   Resp 16   Ht '6\' 2"'$  (1.88 m)   Wt 127 kg   SpO2 100%   BMI 35.95 kg/m  General:   Alert,  pleasant and cooperative in NAD Head:  Normocephalic and atraumatic. Neck:  Supple; no masses or thyromegaly. Lungs:  Clear throughout to auscultation, normal respiratory effort.    Heart:  +S1, +S2, Regular rate and rhythm, No edema. Abdomen:  Soft, nontender and nondistended. Normal bowel sounds, without guarding, and without rebound.  LLQ colostonmt bag noted Neurologic:  Alert and  oriented x4;  grossly normal neurologically.  Impression/Plan: Stephen Vasquez is here for an colonoscopy to be  performed for history of rectal cancer Risks, benefits, limitations, and alternatives regarding  colonoscopy have been reviewed with the patient.  Questions have been answered.  All parties agreeable.   [name], MD  03/22/2021, 10:57 AM

## 2021-03-22 NOTE — Anesthesia Postprocedure Evaluation (Signed)
Anesthesia Post Note  Patient: Stephen Vasquez  Procedure(s) Performed: COLONOSCOPY WITH PROPOFOL  Patient location during evaluation: Phase II Anesthesia Type: General Level of consciousness: awake and alert, awake and oriented Pain management: pain level controlled Vital Signs Assessment: post-procedure vital signs reviewed and stable Respiratory status: spontaneous breathing, nonlabored ventilation and respiratory function stable Cardiovascular status: blood pressure returned to baseline and stable Postop Assessment: no apparent nausea or vomiting Anesthetic complications: no   No notable events documented.   Last Vitals:  Vitals:   03/22/21 1127 03/22/21 1130  BP:  96/60  Pulse: (!) 59 (!) 57  Resp: 14 14  Temp:    SpO2: 97% 99%    Last Pain:  Vitals:   03/22/21 1208  TempSrc:   PainSc: 0-No pain                 Phill Mutter

## 2021-03-22 NOTE — Transfer of Care (Signed)
Immediate Anesthesia Transfer of Care Note  Patient: Stephen Vasquez  Procedure(s) Performed: Procedure(s): COLONOSCOPY WITH PROPOFOL (N/A)  Patient Location: PACU and Endoscopy Unit  Anesthesia Type:General  Level of Consciousness: sedated  Airway & Oxygen Therapy: Patient Spontanous Breathing and Patient connected to nasal cannula oxygen  Post-op Assessment: Report given to RN and Post -op Vital signs reviewed and stable  Post vital signs: Reviewed and stable  Last Vitals:  Vitals:   03/22/21 1127 03/22/21 1130  BP:  96/60  Pulse: (!) 59 (!) 57  Resp: 14 14  Temp:    SpO2: 0000000 [id number]    Complications: No apparent anesthesia complications

## 2021-03-23 ENCOUNTER — Encounter: Payer: Self-pay | Admitting: Gastroenterology

## 2021-03-26 LAB — SURGICAL PATHOLOGY

## 2021-04-11 ENCOUNTER — Ambulatory Visit: Payer: 59 | Admitting: Radiation Oncology

## 2021-04-17 ENCOUNTER — Encounter: Payer: Self-pay | Admitting: Gastroenterology

## 2021-04-18 ENCOUNTER — Ambulatory Visit: Payer: 59 | Admitting: Radiation Oncology

## 2021-05-15 IMAGING — MR MR PELVIS W/O CM
8 series · 48 of 48 positions shown · non-contrast
Comparison: PET-CT on 12/27/2019

CLINICAL DATA: Newly diagnosed rectal carcinoma.

EXAM:
MRI PELVIS WITHOUT CONTRAST
TECHNIQUE: Multiplanar multisequence MR imaging of the pelvis was performed. No
intravenous contrast was administered. Small amount of US gel was
administered per rectum to optimize tumor evaluation.

[Series 2: T2 · sagittal · 3.0mm · 1.19mm/px · 4 of 35 slices shown (1 of 5)]
[im 1/35]
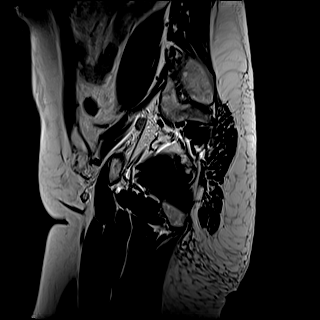
[im 12/35]
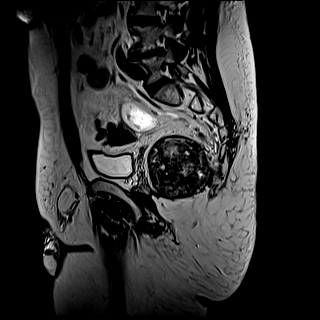
[im 23/35]
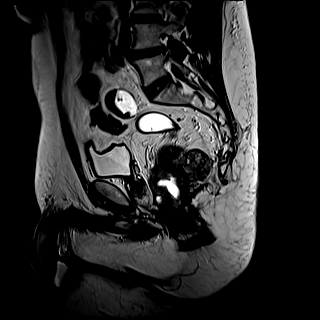
[im 35/35]
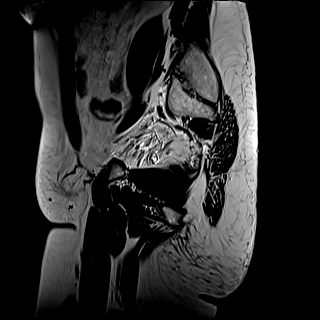

[Series 3: T2 · coronal · 3.0mm · 0.56mm/px · 6 of 45 slices shown (2 of 5)]
[im 1/45]
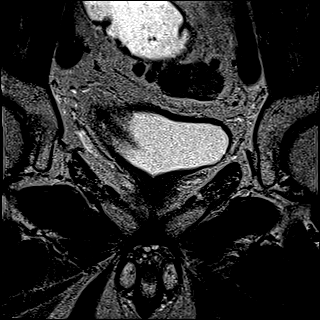
[im 9/45]
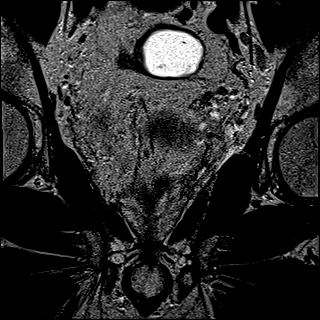
[im 18/45]
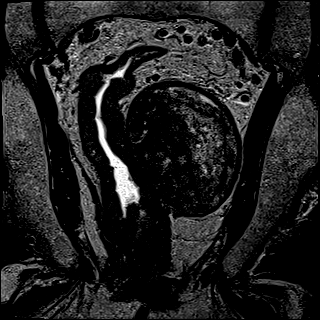
[im 27/45]
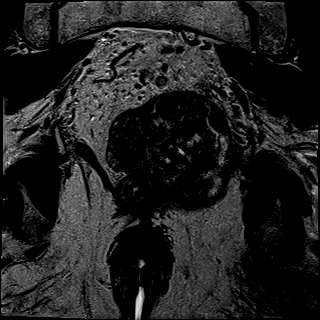
[im 36/45]
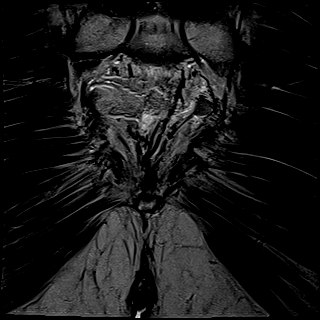
[im 45/45]
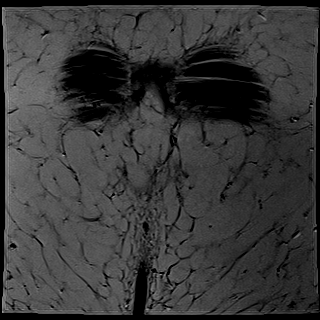

[Series 4: T2 · axial · 5.0mm · 1.19mm/px · z∈[-61,+155]mm · 5 of 37 slices shown (3 of 5)]
[im 1/37]
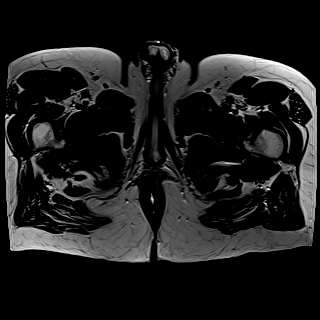
[im 10/37]
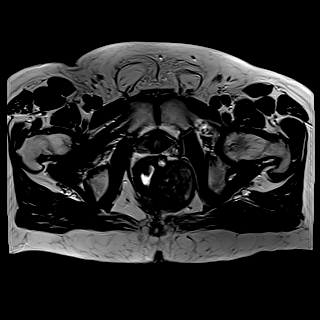
[im 19/37]
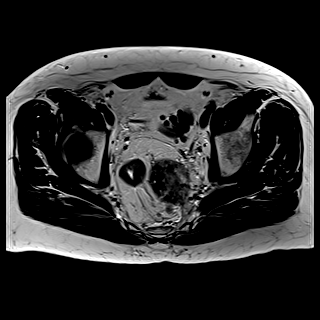
[im 28/37]
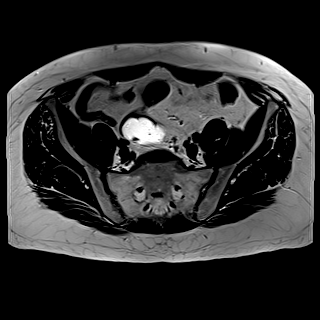
[im 37/37]
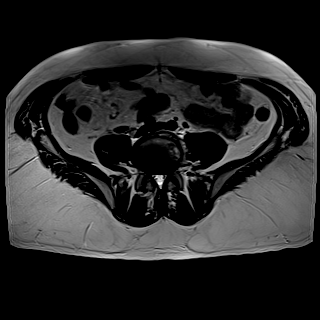

[Series 5: ax dwi_tracew · axial · 3.0mm · 1.48mm/px · z∈[-37,+59]mm · 13 of 99 slices shown]
[im 1/99]
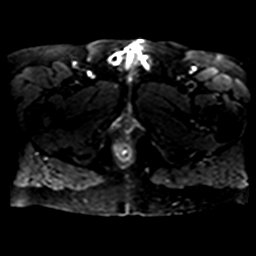
[im 9/99]
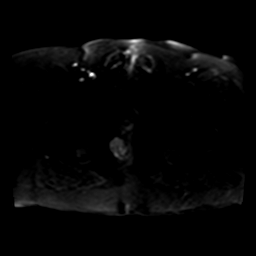
[im 17/99]
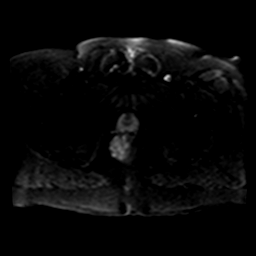
[im 25/99]
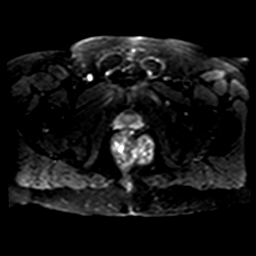
[im 33/99]
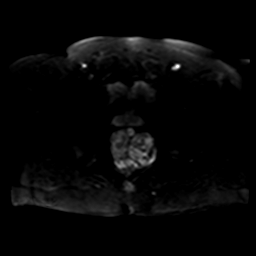
[im 41/99]
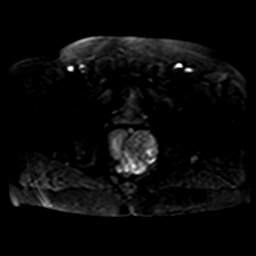
[im 50/99]
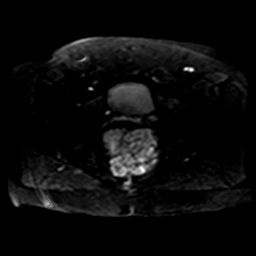
[im 58/99]
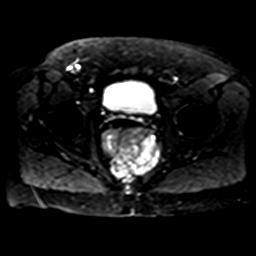
[im 66/99]
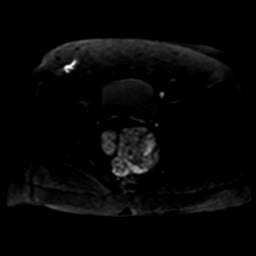
[im 74/99]
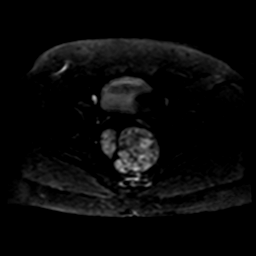
[im 82/99]
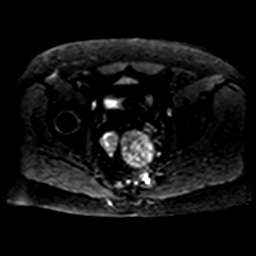
[im 90/99]
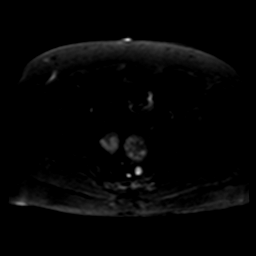
[im 99/99]
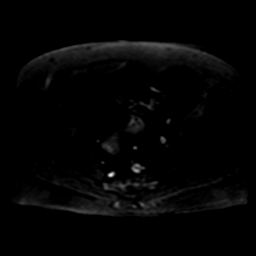

[Series 6: ax dwi_adc · axial · 3.0mm · 1.48mm/px · z∈[-37,+59]mm · 4 of 33 slices shown]
[im 1/33]
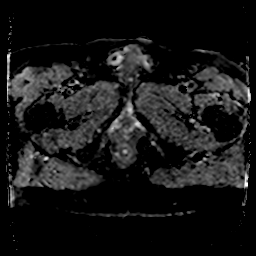
[im 11/33]
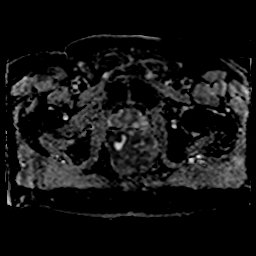
[im 22/33]
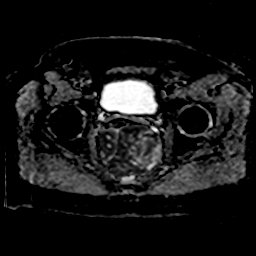
[im 33/33]
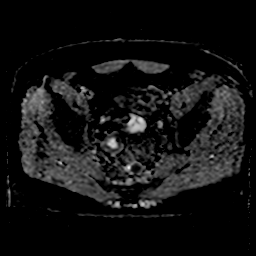

[Series 7: ax dwi_calc_bval · axial · 3.0mm · 1.48mm/px · z∈[-37,+59]mm · 4 of 33 slices shown]
[im 1/33]
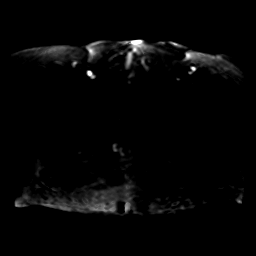
[im 11/33]
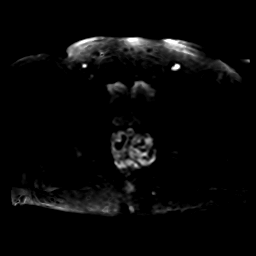
[im 22/33]
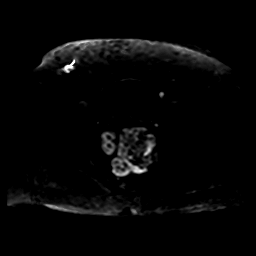
[im 33/33]
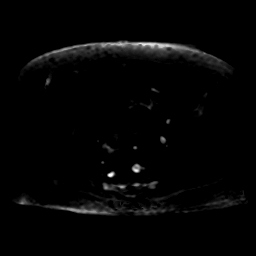

[Series 8: T2 · axial · 3.0mm · 0.56mm/px · z∈[-68,+63]mm · 6 of 45 slices shown (4 of 5)]
[im 1/45]
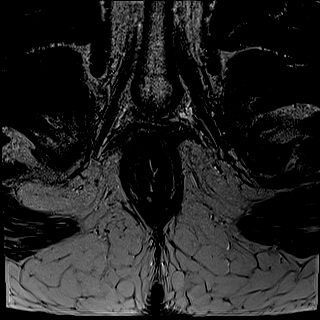
[im 9/45]
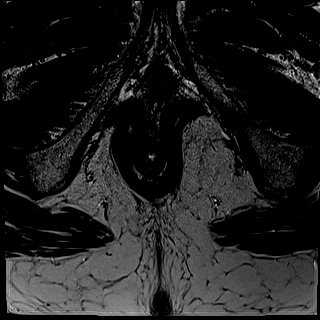
[im 18/45]
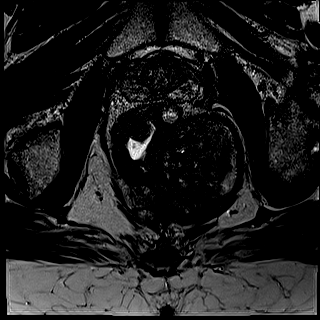
[im 27/45]
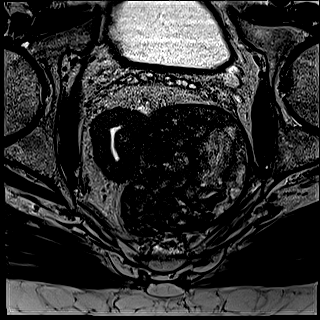
[im 36/45]
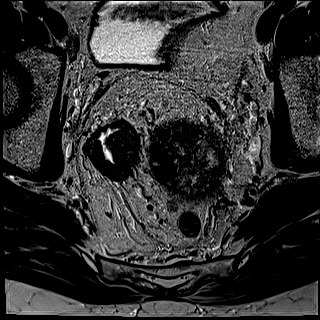
[im 45/45]
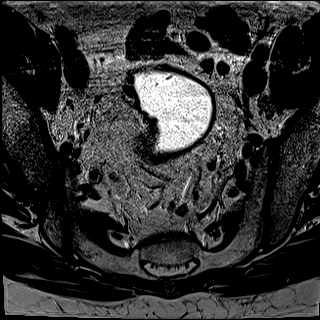

[Series 9: T2 · coronal · 3.0mm · 0.56mm/px · 6 of 45 slices shown (5 of 5)]
[im 1/45]
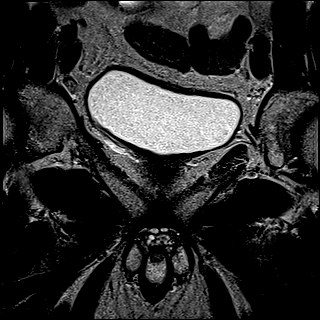
[im 9/45]
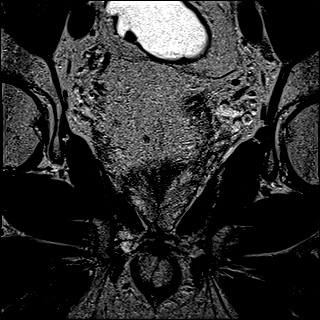
[im 18/45]
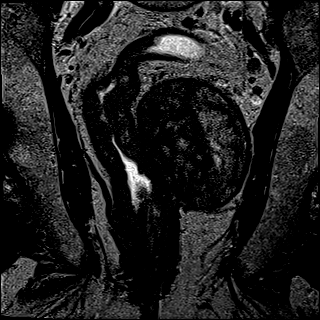
[im 27/45]
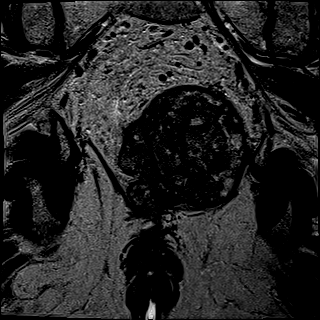
[im 36/45]
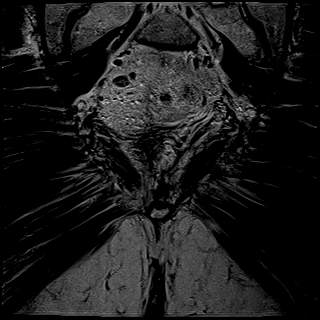
[im 45/45]
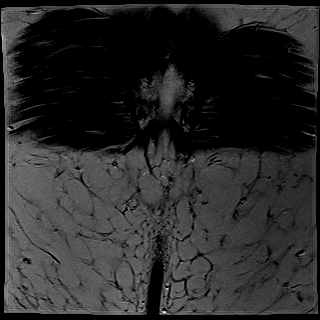

[48 of 48 positions shown; findings below may reference images not displayed]

FINDINGS: TUMOR LOCATION

Tumor distance from Anal Verge/Skin Surface:  4.2 cm

Tumor distance to Internal Anal Sphincter: 0 cm

TUMOR DESCRIPTION

Circumferential Extent: 100%

Tumor Length: 9.3 cm

T - CATEGORY

Extension through Muscularis Propria: Yes. A focal area of extension
through the muscularis propria is seen along the left lateral rectal
wall in the proximal portion of the tumor, which measures
approximately 4 mm (image [DATE]). There is also a focal tumor bulge
along the right lateral wall of the distal rectum which measures
approximately 7 mm, and appears to involve the right levator ani
muscle (image 35/8). (T3c)

Shortest Distance of any tumor/node from Mesorectal Fascia: 0 mm
from large perirectal lymph nodes along the lateral and posterior
aspect of the rectum

Extramural Vascular Invasion/Tumor Thrombus: No

Invasion of Anterior Peritoneal Reflection: No

Involvement of Adjacent Organs or Pelvic Sidewall: No

Levator Ani Involvement: Probable involvement of right levator ani
muscle along the inferior right lateral rectal wall) image [DATE] and
35/8).

N - CATEGORY

Mesorectal Lymph Nodes >=5mm: 4 or more=N2. Large well-circumscribed
heterogeneous masses are seen along the left lateral and posterior
wall of the rectum, largest measuring 8.7 x 6.5 cm. These are most
consistent with markedly enlarged perirectal lymph nodes. Numerous
other enlarged lymph nodes are seen throughout the perirectal space.

Extra-mesorectal Lymphadenopathy: Yes, in left internal and external
iliac chains measuring up to 7 mm = N2

Other:  Sigmoid diverticulosis, without evidence of diverticulitis.
IMPRESSION: Rectal adenocarcinoma T stage: T3c; probable involvement of right
levator ani muscle also noted

Rectal adenocarcinoma N stage: N2; unusual, markedly enlarged left
lateral and posterior perirectal lymph nodes noted, largest
measuring 8.7 x 6.5 cm, displacing the rectum to the right.

Distance from tumor to the internal anal sphincter is 0 cm.

## 2021-05-16 ENCOUNTER — Other Ambulatory Visit: Payer: Self-pay

## 2021-05-16 ENCOUNTER — Ambulatory Visit
Admission: RE | Admit: 2021-05-16 | Discharge: 2021-05-16 | Disposition: A | Discharge status: Home / Self Care | Payer: 59 | Source: Ambulatory Visit | Attending: Radiation Oncology | Admitting: Radiation Oncology

## 2021-05-16 ENCOUNTER — Encounter: Payer: Self-pay | Admitting: Radiation Oncology

## 2021-05-16 DIAGNOSIS — Z923 Personal history of irradiation: Secondary | ICD-10-CM | POA: Diagnosis not present

## 2021-05-16 DIAGNOSIS — Z9221 Personal history of antineoplastic chemotherapy: Secondary | ICD-10-CM | POA: Diagnosis not present

## 2021-05-16 DIAGNOSIS — C20 Malignant neoplasm of rectum: Secondary | ICD-10-CM

## 2021-05-16 DIAGNOSIS — Z85048 Personal history of other malignant neoplasm of rectum, rectosigmoid junction, and anus: Secondary | ICD-10-CM | POA: Diagnosis not present

## 2021-05-16 DIAGNOSIS — Z933 Colostomy status: Secondary | ICD-10-CM | POA: Insufficient documentation

## 2021-05-16 NOTE — Progress Notes (Signed)
Radiation Oncology Follow up Note  Name: Stephen Vasquez   Date:   05/16/2021 MRN:  [id number] DOB: 01/24/1961    This 60 y.o. 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  REFERRING PROVIDER: Jon Billings, NP  HPI: Patient is a 60 year old male now out 10 months having completed neoadjuvant chemoradiation plus TNT FOLFOX chemotherapy for stage III adenocarcinoma distal rectum..  Time of surgery had a complete response.  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  He had a CT scan back in August which showed no evidence of new or progressive disease within the chest abdomen or pelvis.  He had decreased size of small aortocaval and left external iliac lymph nodes.  His CEA is continues to be low at 1.0 measured back in February COMPLICATIONS OF TREATMENT: none  FOLLOW UP COMPLIANCE: keeps appointments   PHYSICAL EXAM:  BP (!) (P) 145/92 (BP Location: Left Arm, Patient Position: Sitting)   Pulse (P) 79   Temp (!) (P) 97.2 F (36.2 C) (Tympanic)   Resp (P) 16   Wt (P) 285 lb 1.6 oz (129.3 kg)   BMI (P) 36.60 kg/m  Patient has a functioning colostomy.  Well-developed well-nourished patient in NAD. HEENT reveals PERLA, EOMI, discs not visualized.  Oral cavity is clear. No oral mucosal lesions are identified. Neck is clear without evidence of cervical or supraclavicular adenopathy. Lungs are clear to A&P. Cardiac examination is essentially unremarkable with regular rate and rhythm without murmur rub or thrill. Abdomen is benign with no organomegaly or masses noted. Motor sensory and DTR levels are equal and symmetric in the upper and lower extremities. Cranial nerves II through XII are grossly intact. Proprioception is intact. No peripheral adenopathy or edema is identified. No motor or sensory levels are  noted. Crude visual fields are within normal range.  RADIOLOGY RESULTS: CT scan reviewed compatible with above-stated findings  PLAN: Present time patient is doing well with no evidence of disease now about 10 months from concurrent chemoradiation in neoadjuvant fashion.  He is without complaint.  And pleased with his overall progress.  Of asked to see him back in 6 months for follow-up and then will start once year follow-up appointments.  Patient knows to call with any concerns.  I would like to take this opportunity to thank you for allowing me to participate in the care of your patient.Noreene Filbert, MD

## 2021-06-06 ENCOUNTER — Inpatient Hospital Stay: Payer: 59 | Attending: Internal Medicine

## 2021-08-29 ENCOUNTER — Ambulatory Visit: Payer: 59

## 2021-09-05 ENCOUNTER — Other Ambulatory Visit: Payer: 59

## 2021-09-05 ENCOUNTER — Ambulatory Visit: Payer: 59 | Admitting: Internal Medicine

## 2021-09-06 ENCOUNTER — Encounter: Payer: Self-pay | Admitting: Internal Medicine

## 2021-09-13 ENCOUNTER — Other Ambulatory Visit: Payer: Self-pay

## 2021-09-13 ENCOUNTER — Encounter: Payer: Self-pay | Admitting: Internal Medicine

## 2021-09-13 ENCOUNTER — Ambulatory Visit
Admission: RE | Admit: 2021-09-13 | Discharge: 2021-09-13 | Disposition: A | Discharge status: Home / Self Care | Payer: BC Managed Care – PPO | Source: Ambulatory Visit | Attending: Internal Medicine | Admitting: Internal Medicine

## 2021-09-13 DIAGNOSIS — C20 Malignant neoplasm of rectum: Secondary | ICD-10-CM | POA: Insufficient documentation

## 2021-09-13 DIAGNOSIS — Z9889 Other specified postprocedural states: Secondary | ICD-10-CM | POA: Diagnosis not present

## 2021-09-13 DIAGNOSIS — I700 Atherosclerosis of aorta: Secondary | ICD-10-CM | POA: Diagnosis not present

## 2021-09-13 DIAGNOSIS — M47814 Spondylosis without myelopathy or radiculopathy, thoracic region: Secondary | ICD-10-CM | POA: Diagnosis not present

## 2021-09-13 DIAGNOSIS — C19 Malignant neoplasm of rectosigmoid junction: Secondary | ICD-10-CM | POA: Diagnosis not present

## 2021-09-13 MED ORDER — IOHEXOL 300 MG/ML  SOLN
100.0000 mL | Freq: Once | INTRAMUSCULAR | Status: AC | PRN
  Administered 2021-09-13: 100 mL via INTRAVENOUS

## 2021-09-17 ENCOUNTER — Other Ambulatory Visit: Payer: Self-pay

## 2021-09-17 ENCOUNTER — Inpatient Hospital Stay (HOSPITAL_BASED_OUTPATIENT_CLINIC_OR_DEPARTMENT_OTHER): Payer: BC Managed Care – PPO | Admitting: Internal Medicine

## 2021-09-17 ENCOUNTER — Inpatient Hospital Stay: Payer: BC Managed Care – PPO | Attending: Internal Medicine

## 2021-09-17 DIAGNOSIS — C20 Malignant neoplasm of rectum: Secondary | ICD-10-CM

## 2021-09-17 DIAGNOSIS — R1310 Dysphagia, unspecified: Secondary | ICD-10-CM | POA: Insufficient documentation

## 2021-09-17 DIAGNOSIS — E669 Obesity, unspecified: Secondary | ICD-10-CM | POA: Diagnosis not present

## 2021-09-17 DIAGNOSIS — Z923 Personal history of irradiation: Secondary | ICD-10-CM | POA: Diagnosis not present

## 2021-09-17 DIAGNOSIS — Z87891 Personal history of nicotine dependence: Secondary | ICD-10-CM | POA: Insufficient documentation

## 2021-09-17 DIAGNOSIS — Z933 Colostomy status: Secondary | ICD-10-CM | POA: Diagnosis not present

## 2021-09-17 LAB — CBC WITH DIFFERENTIAL/PLATELET
Abs Immature Granulocytes: 0.02 10*3/uL (ref 0.00–0.07)
Basophils Absolute: 0 10*3/uL (ref 0.0–0.1)
Basophils Relative: 1 %
Eosinophils Absolute: 0.2 10*3/uL (ref 0.0–0.5)
Eosinophils Relative: 3 %
HCT: 40.8 % (ref 39.0–52.0)
Hemoglobin: 13.5 g/dL (ref 13.0–17.0)
Immature Granulocytes: 0 %
Lymphocytes Relative: 18 %
Lymphs Abs: 1 10*3/uL (ref 0.7–4.0)
MCH: 29.3 pg (ref 26.0–34.0)
MCHC: 33.1 g/dL (ref 30.0–36.0)
MCV: 88.7 fL (ref 80.0–100.0)
Monocytes Absolute: 0.3 10*3/uL (ref 0.1–1.0)
Monocytes Relative: 6 %
Neutro Abs: 4.1 10*3/uL (ref 1.7–7.7)
Neutrophils Relative %: 72 %
Platelets: 230 10*3/uL (ref 150–400)
RBC: 4.6 MIL/uL (ref 4.22–5.81)
RDW: 12.3 % (ref 11.5–15.5)
WBC: 5.6 10*3/uL (ref 4.0–10.5)
nRBC: 0 % (ref 0.0–0.2)

## 2021-09-17 LAB — COMPREHENSIVE METABOLIC PANEL
ALT: 23 U/L (ref 0–44)
AST: 27 U/L (ref 15–41)
Albumin: 3.9 g/dL (ref 3.5–5.0)
Alkaline Phosphatase: 84 U/L (ref 38–126)
Anion gap: 8 (ref 5–15)
BUN: 25 mg/dL — ABNORMAL HIGH (ref 6–20)
CO2: 23 mmol/L (ref 22–32)
Calcium: 9 mg/dL (ref 8.9–10.3)
Chloride: 104 mmol/L (ref 98–111)
Creatinine, Ser: 1.28 mg/dL — ABNORMAL HIGH (ref 0.61–1.24)
GFR, Estimated: >60 mL/min (ref >60)
Glucose, Bld: 142 mg/dL — ABNORMAL HIGH (ref 70–99)
Potassium: 3.8 mmol/L (ref 3.5–5.1)
Sodium: 135 mmol/L (ref 135–145)
Total Bilirubin: 0.2 mg/dL — ABNORMAL LOW (ref 0.3–1.2)
Total Protein: 7.6 g/dL (ref 6.5–8.1)

## 2021-09-17 MED ORDER — SODIUM CHLORIDE 0.9% FLUSH
10.0000 mL | Freq: Once | INTRAVENOUS | Status: AC
  Administered 2021-09-17: 10 mL via INTRAVENOUS
  Filled 2021-09-17: qty 10

## 2021-09-17 MED ORDER — HEPARIN SOD (PORK) LOCK FLUSH 100 UNIT/ML IV SOLN
500.0000 [IU] | Freq: Once | INTRAVENOUS | Status: AC
  Administered 2021-09-17: 500 [IU] via INTRAVENOUS
  Filled 2021-09-17: qty 5

## 2021-09-17 NOTE — Progress Notes (Signed)
Patient here for oncology follow-up appointment,  concerns of dizziness 

## 2021-09-17 NOTE — Assessment & Plan Note (Addendum)
#   Rectal adenocarcinoma- moderate differentiated; stage III [multiple pelvic lymph nodes]; ? borderline 9 mm retroperitoneal LN.  S/P total neoadjuvant chemotherapy/radiation therapy-s/p APR- JAN 2022 [[facility]]- pathologic CR. FEB 2023- CT-. No evidence of new or progressive metastatic disease within the chest, abdomen, or pelvis.  Decreased size of the indexed small aortocaval and left external iliac lymph nodes.  Chronic mild presacral soft tissue stranding-likely radiation changes.  # AUG 2022- COLON POLYP [Dr.Anna]  TUBULAR ADENOMA; - NEGATIVE FOR HIGH-GRADE DYSPLASIA AND MALIGNANCY. Repeat colo in AUG 2022 [again 3 years]  # Difficulty swallowing [solids/liquids; 2-3 times a week]- 2-3 months; no weight loss; no recent EGD' ? Reflux; recommend PPI twice a day.  Recommend reevaluation with Dr. Vicente Males.  No no obvious radiologic findings noted on the CT scan.  # PN-cold sensistivity-G-1-STABLE.   #IV access: Mediport in place- STABLE. Discussed the pros and cons of continued Mediport.  Plan keeping it for now; flush every 3 months.    #Obesity [Bl [id number]]: weight again: 308 pounds.  Counseled the patient regarding weight loss.   # #Incidental findings on CT scan imaging dated: FEB 2023: Arteriosclerosis-defer to PCP regarding cardiology evaluation I reviewed/discussed/counseled the patient.   DISPOSITION:  # port flush in 3 months # follow up in 6 months- PORT FLUSH; cbc/cmp/CEA; CT C/A/P-Dr.B   # I reviewed the blood work- with the patient in detail; also reviewed the imaging independently [as summarized above]; and with the patient in detail.

## 2021-09-17 NOTE — Progress Notes (Signed)
Survivorship Care Plan visit completed.  Treatment summary reviewed and given to patient.  ASCO answers booklet reviewed and given to patient.  CARE program and Cancer Transitions discussed with patient along with other resources cancer center offers to patients and caregivers.  Patient verbalized understanding.    

## 2021-09-17 NOTE — Progress Notes (Signed)
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 CEA- 55. CT C/A/P- Large mass involving the rectum is identified compatible with primary rectal carcinoma. This has a large, 6.7 cm exophytic component in the left pelvis. Multiple perirectal lymph nodes are identified compatible with metastatic adenopathy; Borderline enlarged aortocaval node measures 0.9 cm; PET-bulky rectal mass; multiple lymph nodes noted; no uptake in the retroperitoneal lymph nodes; MRI rectum- T3N2M0;   Pretreatment CEA 94; TNT  # June 1st 2021- FOLFOX T3N2M0;   Pretreatment CEA 94.  S/p TNT [total neoadjuvant therapy]; currently s/p FOLFOX chemotherapy. CEA- pre-treatment- ~100; CEA- 2.0; also s/p 5FU- CIV M-F; RT [ 9/20-10/25]. JAN 8th, 2022- APR-COMPLETE pathologic response. [Dr.Sadiq; [facility]]  # # AUG 2022- COLON POLYP [Dr.Anna]  TUBULAR ADENOMA; - NEGATIVE FOR HIGH-GRADE DYSPLASIA AND MALIGNANCY. Repeat colo in AUG 2022 [again 3 years]  # SURVIVORSHIP:   # GENETICS:   DIAGNOSIS: RECTAL CA  STAGE:   III     ;  GOALS: cure  CURRENT/MOST RECENT THERAPY : Surveillance   Rectal cancer ([place])  12/20/2019 Initial Diagnosis   Rectal cancer ([place])   01/04/2020 - 04/11/2020 Chemotherapy   The patient had dexamethasone (DECADRON) 4 MG tablet, 8 mg,  Oral, Daily, 1 of 1 cycle, Start date: --, End date: -- palonosetron (ALOXI) injection 0.25 mg, 0.25 mg, Intravenous,  Once, 8 of 8 cycles Administration: 0.25 mg (01/04/2020), 0.25 mg (01/17/2020), 0.25 mg (01/31/2020), 0.25 mg (02/14/2020), 0.25 mg (02/28/2020), 0.25 mg (03/13/2020), 0.25 mg (04/11/2020) pegfilgrastim (NEULASTA ONPRO KIT) injection 6 mg, 6 mg, Subcutaneous, Once, 2 of 2 cycles Administration: 6 mg (02/02/2020) leucovorin 1,000 mg in dextrose 5 % 250 mL infusion, 1,008 mg, Intravenous,  Once, 8 of 8 cycles Administration: 1,000 mg (01/04/2020), 1,000 mg (01/17/2020), 1,000 mg (01/31/2020), 1,000 mg (02/14/2020), 1,000 mg (02/28/2020), 1,000 mg (03/13/2020), 1,000 mg (04/11/2020) oxaliplatin (ELOXATIN) 200 mg in dextrose 5 % 500 mL chemo infusion, 215 mg, Intravenous,  Once, 8 of 8 cycles Administration: 200 mg (01/04/2020), 200 mg (01/17/2020), 200 mg (01/31/2020), 200 mg (02/14/2020), 200 mg (02/28/2020), 200 mg (03/13/2020), 200 mg (04/11/2020) fluorouracil (ADRUCIL) chemo injection 1,000 mg, 400 mg/m2 = 1,000 mg, Intravenous,  Once, 2 of 2 cycles Administration: 1,000 mg (01/04/2020), 1,000 mg (01/17/2020) fluorouracil (ADRUCIL) 6,050 mg in sodium chloride 0.9 % 129 mL chemo infusion, 2,400 mg/m2 = 6,050 mg, Intravenous, 1 Day/Dose, 8 of 8 cycles Administration: 6,050 mg (01/04/2020), 6,050 mg (01/17/2020), 6,050 mg (01/31/2020), 6,050 mg (02/14/2020), 6,050 mg (02/28/2020), 6,050 mg (03/13/2020), 6,050 mg (04/11/2020)   for chemotherapy treatment.     04/24/2020 -  Chemotherapy   The patient had fluorouracil (ADRUCIL) 2,800 mg in sodium chloride 0.9 % 94 mL chemo infusion, 225 mg/m2/day = 2,800 mg, Intravenous, 5D (120 hours), 5 of 5 cycles Administration: 2,800 mg (04/24/2020), 2,800 mg (05/01/2020), 2,800 mg (05/08/2020), 2,800 mg (05/15/2020), 2,800 mg (05/22/2020)   for chemotherapy treatment.        HISTORY OF PRESENTING ILLNESS:  Ambulating independently.  Alone. Stephen Vasquez 61 y.o.  male  With stage III  rectal cancer currently s/p TNT-FOLFOX chemotherapy & s/p chemoradiation May 29, 2020-s/p surgery [facility] in [facility]22-is here today with results of the surveillance CT scan.  Denies any worsening shortness of breath no weight loss.  In fact weight gain.  No nausea no vomiting.  Colostomy working well.   Patient complains of mild difficulty swallowing intermittently over the last 2 to 3 months [both liquids and solids 2 times per week].  Complains of reflux.  Denies any significant tingling or numbness.  Review of Systems  Constitutional:  Positive for malaise/fatigue. Negative for chills, diaphoresis, fever and weight loss.  HENT:  Negative for nosebleeds and sore throat.   Eyes:  Negative for double vision.  Respiratory:  Negative for cough, hemoptysis, sputum production, shortness of breath and wheezing.   Cardiovascular:  Negative for chest pain, palpitations, orthopnea and leg swelling.  Gastrointestinal:  Negative for abdominal pain, constipation, diarrhea, heartburn, melena, nausea and vomiting.  Genitourinary:  Negative for dysuria, frequency and urgency.  Musculoskeletal:  Negative for back pain.  Skin: Negative.  Negative for itching and rash.  Neurological:  Negative for dizziness, focal weakness, weakness and headaches.  Endo/Heme/Allergies:  Does not bruise/bleed easily.  Psychiatric/Behavioral:  Negative for depression. The patient is not nervous/anxious and does not have insomnia.     MEDICAL HISTORY:  Past Medical History:  Diagnosis Date   Allergic rhinitis    Heart murmur    Rectal cancer ([place])     SURGICAL HISTORY: Past Surgical History:  Procedure Laterality Date   COLONOSCOPY WITH PROPOFOL N/A 12/15/2019   Procedure: COLONOSCOPY WITH PROPOFOL;  Surgeon: [name], MD;  Location: [facility] ENDOSCOPY;  Service: Gastroenterology;  Laterality: N/A;   COLONOSCOPY WITH PROPOFOL N/A 03/21/2021   Procedure: COLONOSCOPY WITH PROPOFOL;  Surgeon: [name], MD;   Location: [facility] ENDOSCOPY;  Service: Gastroenterology;  Laterality: N/A;   COLONOSCOPY WITH PROPOFOL N/A 03/22/2021   Procedure: COLONOSCOPY WITH PROPOFOL;  Surgeon: [name], MD;  Location: [facility] ENDOSCOPY;  Service: Gastroenterology;  Laterality: N/A;   COLOSTOMY  08/2020   PORTACATH PLACEMENT Left 12/29/2019   Procedure: INSERTION PORT-A-CATH;  Surgeon: Robert Bellow, MD;  Location: [facility] ORS;  Service: General;  Laterality: Left;    SOCIAL HISTORY: Social History   Socioeconomic History   Marital status: Married    Spouse name: Not on file   Number of children: Not on file   Years of education: Not on file   Highest education level: Not on file  Occupational History   Not on file  Tobacco Use   Smoking status: Former   Smokeless tobacco: Never  Vaping Use   Vaping Use: Never used  Substance and Sexual Activity   Alcohol use: Not Currently   Drug use: Never   Sexual activity: Yes  Other Topics Concern   Not on file  Social History Narrative   Lives in [place]; Freight forwarder at [employer]; 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 2 children- in boy and girls in late 45s.    Social Determinants of Health   Financial Resource Strain: Not on file  Food Insecurity: Not on file  Transportation Needs: Not on file  Physical Activity: Not on file  Stress: Not on file  Social Connections: Not on file  Intimate Partner Violence: Not on file    FAMILY HISTORY: Family History  Problem Relation Age of Onset   Hypertension Mother  Hypertension Father    Stomach cancer Father        in 75s- survived.    Heart disease Brother     ALLERGIES:  has No Known Allergies.  MEDICATIONS:  Current Outpatient Medications  Medication Sig Dispense Refill   Acetaminophen (TYLENOL EXTRA STRENGTH PO) Take 1 tablet by mouth every 8 (eight) hours as needed (pain).     azelastine (ASTELIN) 0.1 % nasal spray Place 2 sprays into both nostrils 2 (two) times daily. Use in each nostril as  directed 30 mL 12   lidocaine-prilocaine (EMLA) cream Apply 1 application topically as needed (port access). 30 g 3   montelukast (SINGULAIR) 10 MG tablet Take 1 tablet (10 mg total) by mouth at bedtime. 30 tablet 3   oxymetazoline (AFRIN) 0.05 % nasal spray Place 1 spray into both nostrils 2 (two) times daily as needed for congestion.     tadalafil (CIALIS) 5 MG tablet Take 1 tablet (5 mg total) by mouth daily. 30 tablet 11   Sod Picosulfate-Mag Ox-Cit Acd (CLENPIQ) 10-3.5-12 MG-GM -GM/160ML SOLN Take 1 bottle at 5 PM followed by five 8 oz cups of water and repeat 5 hours before procedure. 320 mL 0   No current facility-administered medications for this visit.      Marland Kitchen  PHYSICAL EXAMINATION: ECOG PERFORMANCE STATUS: 1 - Symptomatic but completely ambulatory  Vitals:   09/17/21 1440  BP: 133/88  Pulse: 72  Resp: 18  Temp: (!) 96 F (35.6 C)  SpO2: 99%   Filed Weights   09/17/21 1440  Weight: (!) 308 lb (139.7 kg)    Physical Exam Constitutional:      Comments: He is alone.   Walk independently.  HENT:     Head: Normocephalic and atraumatic.     Mouth/Throat:     Pharynx: No oropharyngeal exudate.  Eyes:     Pupils: Pupils are equal, round, and reactive to light.  Cardiovascular:     Rate and Rhythm: Normal rate and regular rhythm.  Pulmonary:     Effort: Pulmonary effort is normal. No respiratory distress.     Breath sounds: Normal breath sounds. No wheezing.  Abdominal:     General: Bowel sounds are normal. There is no distension.     Palpations: Abdomen is soft. There is no mass.     Tenderness: There is no abdominal tenderness. There is no guarding or rebound.     Comments: Positive for colostomy.  Musculoskeletal:        General: No tenderness. Normal range of motion.     Cervical back: Normal range of motion and neck supple.  Skin:    General: Skin is warm.  Neurological:     Mental Status: He is alert and oriented to person, place, and time.  Psychiatric:         Mood and Affect: Affect normal.     LABORATORY DATA:  I have reviewed the data as listed Lab Results  Component Value Date   WBC 5.6 09/17/2021   HGB 13.5 09/17/2021   HCT 40.8 09/17/2021   MCV 88.7 09/17/2021   PLT 230 09/17/2021   Recent Labs    03/07/21 1354 09/17/21 1405  NA 137 135  K 3.9 3.8  CL 103 104  CO2 26 23  GLUCOSE 97 142*  BUN 27* 25*  CREATININE 1.26* 1.28*  CALCIUM 9.1 9.0  GFRNONAA >60 >60  PROT 7.7 7.6  ALBUMIN 4.3 3.9  AST 20 27  ALT 23 23  ALKPHOS 96 84  BILITOT 0.5 0.2*    RADIOGRAPHIC STUDIES: I have personally reviewed the radiological images as listed and agreed with the findings in the report. CT CHEST ABDOMEN PELVIS W CONTRAST  Result Date: 09/13/2021 CLINICAL DATA:  Colorectal cancer status post partial colectomy with colostomy formation and chemo/radiation therapy. Surveillance/monitoring. EXAM: CT CHEST, ABDOMEN, AND PELVIS WITH CONTRAST TECHNIQUE: Multidetector CT imaging of the chest, abdomen and pelvis was performed following the standard protocol during bolus administration of intravenous contrast. RADIATION DOSE REDUCTION: This exam was performed according to the departmental dose-optimization program which includes automated exposure control, adjustment of the mA and/or kV according to patient size and/or use of iterative reconstruction technique. CONTRAST:  130m OMNIPAQUE IOHEXOL 300 MG/ML  SOLN COMPARISON:  Multiple priors including most recent CT March 06, 2021 FINDINGS: CT CHEST FINDINGS Cardiovascular: Left chest Port-A-Cath with tip near the superior cavoatrial junction. Aortic atherosclerosis without aneurysmal dilation. No central pulmonary embolus on this nondedicated study. Normal size heart. No significant pericardial effusion/thickening. Mediastinum/Nodes: No supraclavicular adenopathy. No discrete thyroid nodularity. No pathologically enlarged mediastinal, hilar or axillary lymph nodes. The trachea and esophagus are grossly  unremarkable. Lungs/Pleura: No suspicious pulmonary nodules or masses. No focal airspace consolidation. No pleural effusion. No pneumothorax. Musculoskeletal: Thoracic spondylosis. No aggressive lytic or blastic lesion of bone. CT ABDOMEN PELVIS FINDINGS Hepatobiliary: No suspicious hepatic lesion. Gallbladder is unremarkable. No biliary ductal dilation. Pancreas: No pancreatic ductal dilation or evidence of acute inflammation. Spleen: Normal in size without focal abnormality. Adrenals/Urinary Tract: Bilateral adrenal glands appear normal. No hydronephrosis. The kidneys demonstrate symmetric enhancement and excretion of contrast material. No suspicious filling defect visualized within the opacified portions of the collecting systems or ureters on delayed imaging. No solid enhancing renal mass. Urinary bladder is unremarkable for degree of distension. Stomach/Bowel: Radiopaque enteric contrast material traverses distal loops of small bowel. Postsurgical change of prior partial left hemicolectomy with left anterior colostomy. Stomach is unremarkable for degree of distension. No pathologic dilation of small or large bowel. The appendix and terminal ileum appear normal. No evidence of acute bowel inflammation. Vascular/Lymphatic: Aortic atherosclerosis without abdominal aortic aneurysm. Unchanged size of the previously indexed small aortocaval lymph node measuring 3 mm on image 85/2. Decreased size of the index left external iliac lymph node now measures 2 mm previously 3 mm. No new or progressive adenopathy in the abdomen or pelvis. Reproductive: Prostate is unremarkable. Other: Similar mild presacral soft tissue stranding/thickening, likely post treatment related. No significant abdominopelvic free fluid. No discrete peritoneal or omental nodularity. Musculoskeletal: No aggressive lytic or blastic lesion of bone. Multilevel degenerative changes spine. IMPRESSION: 1. Stable examination status post prior partial left  hemicolectomy and left anterior abdominal colostomy with similar mild presacral soft tissue stranding/thickening, favored post treatment change. 2. No new or progressive disease in the chest abdomen or pelvis to suggest recurrent or metastatic disease. 3.  Aortic Atherosclerosis (ICD10-I70.0). Electronically Signed   By: JDahlia BailiffM.D.   On: 09/13/2021 14:39    ASSESSMENT & PLAN:   Rectal cancer ([place] # Rectal adenocarcinoma- moderate differentiated; stage III [multiple pelvic lymph nodes]; ? borderline 9 mm retroperitoneal LN.  S/P total neoadjuvant chemotherapy/radiation therapy-s/p APR- JAN 2022 [[facility]]- pathologic CR. FEB 2023- CT-. No evidence of new or progressive metastatic disease within the chest, abdomen, or pelvis.  Decreased size of the indexed small aortocaval and left external iliac lymph nodes.  Chronic mild presacral soft tissue stranding-likely radiation changes.   # AUG 2022- COLON POLYP [Dr.Anna]  TUBULAR ADENOMA; - NEGATIVE FOR HIGH-GRADE DYSPLASIA AND MALIGNANCY. Repeat colo in AUG 2022 [again 3 years]   # Difficulty swallowing [solids/liquids; 2-3 times a week]- 2-3 months; no weight loss; no recent EGD' ? Reflux; recommend PPI twice a day.  Recommend reevaluation with Dr. Vicente Males.  No no obvious radiologic findings noted on the CT scan.    # PN-cold sensistivity-G-1-STABLE.   #IV access: Mediport in place- STABLE. Discussed the pros and cons of continued Mediport.  Plan keeping it for now; flush every 3 months.    #Obesity [Bl [id number]]: weight again: 308 pounds.  Counseled the patient regarding weight loss.   # #Incidental findings on CT scan imaging dated: FEB 2023: Arteriosclerosis-defer to PCP regarding cardiology evaluation I reviewed/discussed/counseled the patient.   DISPOSITION:  # port flush in 3 months # follow up in 6 months- PORT FLUSH; cbc/cmp/CEA; CT C/A/P-Dr.B   # I reviewed the blood work- with the patient in detail; also reviewed the imaging  independently [as summarized above]; and with the patient in detail.      All questions were answered. The patient knows to call the clinic with any problems, questions or concerns.    Cammie Sickle, MD 09/18/2021 10:04 AM

## 2021-09-18 ENCOUNTER — Encounter: Payer: Self-pay | Admitting: Internal Medicine

## 2021-09-18 LAB — CEA: CEA: 1.5 ng/mL (ref 0.0–4.7)

## 2021-10-15 DIAGNOSIS — C20 Malignant neoplasm of rectum: Secondary | ICD-10-CM | POA: Diagnosis not present

## 2021-10-15 DIAGNOSIS — Z933 Colostomy status: Secondary | ICD-10-CM | POA: Diagnosis not present

## 2021-10-16 DIAGNOSIS — C20 Malignant neoplasm of rectum: Secondary | ICD-10-CM | POA: Diagnosis not present

## 2021-10-16 DIAGNOSIS — Z933 Colostomy status: Secondary | ICD-10-CM | POA: Diagnosis not present

## 2021-11-01 ENCOUNTER — Encounter: Payer: Self-pay | Admitting: Urology

## 2021-11-01 ENCOUNTER — Encounter: Payer: Self-pay | Admitting: Internal Medicine

## 2021-11-01 ENCOUNTER — Ambulatory Visit: Payer: BC Managed Care – PPO | Admitting: Urology

## 2021-11-01 VITALS — BP 149/90 | HR 76 | Ht 74.0 in | Wt 290.0 lb

## 2021-11-01 DIAGNOSIS — N529 Male erectile dysfunction, unspecified: Secondary | ICD-10-CM

## 2021-11-01 MED ORDER — TADALAFIL 5 MG PO TABS: 5.0000 mg | ORAL_TABLET | Freq: Every day | ORAL | 11 refills | Status: AC

## 2021-11-01 NOTE — Progress Notes (Signed)
? ?  11/01/2021 ?2:54 PM  ? ?Stephen Vasquez ?March 22, 1961 ?[id number] ? ?Reason for visit: Follow up ED ? ?HPI: ?61 year old male with history of rectal cancer treated with chemotherapy, radiation, and ultimately surgical resection and diversion with no evidence of residual disease.  He was having some trouble with erections prior to treatment, and was not having any erections after surgery.  We started Cialis 5 mg daily and he had been doing very well with this.  He does report some problems over the last months that if he misses a dose he will notice some increased problems with the erections.  He is also had some weight gain and decreased energy and wonders if this could be testosterone related. ? ?-I recommended increasing the Cialis to 5 to 10 mg/day, or adding a 5 mg boost dose prior to sexual activity ?-Morning testosterone value, call with those results ? ? ?Billey Co, MD ? ?[facility] ?[address], [address] ?[place], [place] [address] ?[phone] ? ? ?

## 2021-11-01 NOTE — Patient Instructions (Signed)
Hypogonadism, Male ?Male hypogonadism is a condition of having a level of testosterone that is lower than normal. Testosterone is a chemical, or hormone, that is made mainly in the testicles. ?In boys, testosterone is responsible for the development of male characteristics during puberty. These include: ?Making the penis bigger. ?Growing and building the muscles. ?Growing facial hair. ?Deepening the voice. ?In adult men, testosterone is responsible for maintaining: ?An interest in sex and the ability to have sex. ?Muscle mass. ?Sperm production. ?Red blood cell production. ?Bone strength. ?Testosterone also gives men energy and a sense of well-being. ?Testosterone normally decreases as men age and the testicles make less testosterone. Testosterone levels can vary from man to man. Not all men will have signs and symptoms of low testosterone. Weight, alcohol use, medicines, and certain medical conditions can affect a man's testosterone level. ?What are the causes? ?This condition is caused by: ?A natural decrease in testosterone that occurs as a man grows older. This is the main cause of this condition. ?Use of medicines, such as antidepressants, steroids, and opioids. ?Diseases and conditions that affect the testicles or the making of testosterone. These include: ?Injury or damage to the testicles from trauma, cancer, cancer treatment, or infection. ?Diabetes. ?Sleep apnea. ?Genetic conditions that men are born with. ?Disease of the pituitary gland. This gland is in the brain. It produces hormones. ?Obesity. ?Metabolic syndrome. This is a group of diseases that affect blood pressure, blood sugar, cholesterol, and belly fat. ?HIV or AIDS. ?Alcohol abuse. ?Kidney failure. ?Other long-term or chronic diseases. ?What are the signs or symptoms? ?Common symptoms of this condition include: ?Loss of interest in sex (low sex drive). ?Inability to have or maintain an erection (erectile dysfunction). ?Feeling tired  (fatigue). ?Mood changes, like irritability or depression. ?Loss of muscle and body hair. ?Infertility. ?Large breasts. ?Weight gain (obesity). ?How is this diagnosed? ?Your health care provider can diagnose hypogonadism based on: ?Your signs and symptoms. ?A physical exam to check your testosterone levels. This includes blood tests. Testosterone levels can change throughout the day. Levels are highest in the morning. You may need to have repeat blood tests before getting a diagnosis of hypogonadism. ?Depending on your medical history and test results, your health care provider may also do other tests to find the cause of low testosterone. ?How is this treated? ?This condition is treated with testosterone replacement therapy. Testosterone can be given by: ?Injection or through pellets inserted under the skin. ?Gels or patches placed on the skin or in the mouth. ?Testosterone therapy is not for everyone. It has risks and side effects. Your health care provider will consider your medical history, your risk for prostate cancer, your age, and your symptoms before putting you on testosterone replacement therapy. ?Follow these instructions at home: ?Take over-the-counter and prescription medicines only as told by your health care provider. ?Eat foods that are high in fiber, such as beans, whole grains, and fresh fruits and vegetables. Limit foods that are high in fat and processed sugars, such as fried or sweet foods. ?If you drink alcohol: ?Limit how much you have to 0-2 drinks a day. ?Know how much alcohol is in your drink. In the [place] one drink equals one 12 oz bottle of beer (355 mL), one 5 oz glass of wine (148 mL), or one 1? oz glass of hard liquor (44 mL). ?Return to your normal activities as told by your health care provider. Ask your health care provider what activities are safe for you. ?Keep all follow-up  visits. This is important. ?Contact a health care provider if: ?You have any of the signs or symptoms of  low testosterone. ?You have any side effects from testosterone therapy. ?Summary ?Male hypogonadism is a condition of having a level of testosterone that is lower than normal. ?The natural drop in testosterone production that occurs with age is the most common cause of this condition. ?Low testosterone can also be caused by many diseases and conditions that affect the testicles and the making of testosterone. ?This condition is treated with testosterone replacement therapy. ?There are risks and side effects of testosterone therapy. Your health care provider will consider your age, medical history, symptoms, and risks for prostate cancer before putting you on testosterone therapy. ?This information is not intended to replace advice given to you by your health care provider. Make sure you discuss any questions you have with your health care provider. ?Document Revised: 03/23/2020 Document Reviewed: 03/23/2020 ?Elsevier Patient Education ? [place]. ? ?

## 2021-11-02 ENCOUNTER — Other Ambulatory Visit
Admission: RE | Admit: 2021-11-02 | Discharge: 2021-11-02 | Disposition: A | Discharge status: Home / Self Care | Payer: BC Managed Care – PPO | Attending: Urology | Admitting: Urology

## 2021-11-02 DIAGNOSIS — N529 Male erectile dysfunction, unspecified: Secondary | ICD-10-CM | POA: Insufficient documentation

## 2021-11-03 LAB — TESTOSTERONE: Testosterone: 181 ng/dL — ABNORMAL LOW (ref 264–916)

## 2021-11-06 ENCOUNTER — Other Ambulatory Visit: Payer: Self-pay

## 2021-11-06 DIAGNOSIS — E291 Testicular hypofunction: Secondary | ICD-10-CM

## 2021-11-11 NOTE — Progress Notes (Signed)
11/23/21 ?8:38 AM  ? ?Stephen Vasquez ?1960/11/09 ?[id number] ? ?Referring provider:  ?No referring provider defined for this encounter. ? ?Chief Complaint  ?Patient presents with  ? Follow-up  ?  Discuss testosterone  ? ?Urological history  ?1. Erectile dysfunction: ?- contributing factors of age, BPH, chemotherapy, pelvic radiation, former smoking and testosterone deficiency ?- Was unable to gain an erection after surgical resection and diversion for rectal cancer treatment  ?- Managed on Cialis 10 mg/daily ?- Testosterone levels was 181 on 11/02/2021  ?- SHIM 14 ? ?2. BPH with LU TS ?- PSA 0.23 11/2021 ?- IPSS 8/2  ? ?HPI: ?Stephen Vasquez is a 61 y.o.male who presents today for further evaluation of low testosterone and discussion of testosterone replacement therapy.  ? ?He has constant fatigue and he also reports weight gain. He has decreased libido.  ? ?He has no specific urological complaints.  Patient denies any modifying or aggravating factors.  Patient denies any gross hematuria, dysuria or suprapubic/flank pain.   Patient denies any fevers, chills, nausea or vomiting.  ? ? IPSS   ? ? [place] Name 11/12/21 1000  ?  ?  ?  ? International Prostate Symptom Score  ? How often have you had the sensation of not emptying your bladder? Less than 1 in 5    ? How often have you had to urinate less than every two hours? Less than 1 in 5 times    ? How often have you found it difficult to postpone urination? Less than half the time    ? How often have you had a weak urinary stream? Less than half the time    ? How often have you had to strain to start urination? Not at All    ? How many times did you typically get up at night to urinate? 2 Times    ? Total IPSS Score 8    ?  ? Quality of Life due to urinary symptoms  ? If you were to spend the rest of your life with your urinary condition just the way it is now how would you feel about that? Mostly Satisfied    ? ?  ?  ? ?  ? ? ?Score:  ?1-7 Mild ?8-19 Moderate ?20-35  Severe ? ?Patient is not having spontaneous erections.  He denies any pain or curvature with erections.   ? ? SHIM   ? ? [place] Name 11/12/21 1047  ?  ?  ?  ? SHIM: Over the last 6 months:  ? How do you rate your confidence that you could get and keep an erection? Low    ? When you had erections with sexual stimulation, how often were your erections hard enough for penetration (entering your partner)? Most Times (much more than half the time)    ? During sexual intercourse, how often were you able to maintain your erection after you had penetrated (entered) your partner? A Few Times (much less than half the time)    ? During sexual intercourse, how difficult was it to maintain your erection to completion of intercourse? Difficult    ? When you attempted sexual intercourse, how often was it satisfactory for you? A Few Times (much less than half the time)    ?  ? SHIM Total Score  ? SHIM 13    ? ?  ?  ? ?  ? ? ?PMH: ?Past Medical History:  ?Diagnosis Date  ? Allergic rhinitis   ? Heart murmur   ?  Rectal cancer ([place])   ? ? ?Surgical History: ?Past Surgical History:  ?Procedure Laterality Date  ? COLONOSCOPY WITH PROPOFOL N/A 12/15/2019  ? Procedure: COLONOSCOPY WITH PROPOFOL;  Surgeon: [name], MD;  Location: [facility] ENDOSCOPY;  Service: Gastroenterology;  Laterality: N/A;  ? COLONOSCOPY WITH PROPOFOL N/A 03/21/2021  ? Procedure: COLONOSCOPY WITH PROPOFOL;  Surgeon: [name], MD;  Location[facility] ENDOSCOPY;  Service: Gastroenterology;  Laterality: N/A;  ? COLONOSCOPY WITH PROPOFOL N/A 03/22/2021  ? Procedure: COLONOSCOPY WITH PROPOFOL;  Surgeon: [name], MD;  Location: [facility] ENDOSCOPY;  Service: Gastroenterology;  Laterality: N/A;  ? COLOSTOMY  08/2020  ? PORTACATH PLACEMENT Left 12/29/2019  ? Procedure: INSERTION PORT-A-CATH;  Surgeon: Robert Bellow, MD;  Location: [facility] ORS;  Service: General;  Laterality: Left;  ? ? ?Home Medications:  ?Allergies as of 11/12/2021   ?No Known Allergies ?  ? ?  ?Medication List  ?   ? ?  ? Accurate as of November 12, 2021 11:59 PM. If you have any questions, ask your nurse or doctor.  ?  ?  ? ?  ? ?azelastine 0.1 % nasal spray ?Commonly known as: ASTELIN ?Place 2 sprays into both nostrils 2 (two) times daily. Use in each nostril as directed ?  ?Clenpiq 10-3.5-12 MG-GM -GM/160ML Soln ?Generic drug: Sod Picosulfate-Mag Ox-Cit Acd ?Take 1 bottle at 5 PM followed by five 8 oz cups of water and repeat 5 hours before procedure. ?  ?lidocaine-prilocaine cream ?Commonly known as: EMLA ?Apply 1 application topically as needed (port access). ?  ?montelukast 10 MG tablet ?Commonly known as: SINGULAIR ?Take 1 tablet (10 mg total) by mouth at bedtime. ?  ?oxymetazoline 0.05 % nasal spray ?Commonly known as: AFRIN ?Place 1 spray into both nostrils 2 (two) times daily as needed for congestion. ?  ?tadalafil 5 MG tablet ?Commonly known as: CIALIS ?Take 1-2 tablets (5-10 mg total) by mouth daily. ?  ?Testosterone 20.25 MG/1.25GM (1.62%) Gel ?Place 2 Act onto the skin daily. ?Started by: Zara Council, PA-C ?  ?TYLENOL EXTRA STRENGTH PO ?Take 1 tablet by mouth every 8 (eight) hours as needed (pain). ?  ? ?  ? ? ?Allergies:  ?No Known Allergies ? ?Family History: ?Family History  ?Problem Relation Age of Onset  ? Hypertension Mother   ? Hypertension Father   ? Stomach cancer Father   ?     in 30s- survived.   ? Heart disease Brother   ? ? ?Social History:  reports that he has quit smoking. He has been exposed to tobacco smoke. He has never used smokeless tobacco. He reports that he does not currently use alcohol. He reports that he does not use drugs. ? ? ?Physical Exam: ?BP 129/85   Pulse 90   Ht '6\' 2"'$  (1.88 m)   Wt (!) 314 lb (142.4 kg)   BMI 40.32 kg/m?   ?Constitutional:  Alert and oriented, No acute distress. ?HEENT: [place] AT, moist mucus membranes.  Trachea midline, no masses. ?Cardiovascular: No clubbing, cyanosis, or edema. ?Respiratory: Normal respiratory effort, no increased work of breathing. ?Skin:  No rashes, bruises or suspicious lesions. ?Neurologic: Grossly intact, no focal deficits, moving all 4 extremities. ?Psychiatric: Normal mood and affect. ? ? ?Laboratory Data: ?Lab Results  ?Component Value Date  ? CREATININE 1.28 (H) 09/17/2021  ? ?Component ?    Latest Ref Rng 11/02/2021  ?Testosterone ?    264 - 916 ng/dL 181 (L)   ? ?  ?Component ?    Latest  Ref Rng 09/17/2021  ?Sodium ?    135 - 145 mmol/L 135   ?Potassium ?    3.5 - 5.1 mmol/L 3.8   ?Chloride ?    98 - 111 mmol/L 104   ?CO2 ?    22 - 32 mmol/L 23   ?Glucose ?    70 - 99 mg/dL 142 (H)   ?BUN ?    6 - 20 mg/dL 25 (H)   ?Creatinine ?    0.61 - 1.24 mg/dL 1.28 (H)   ?Calcium ?    8.9 - 10.3 mg/dL 9.0   ?Total Protein ?    6.5 - 8.1 g/dL 7.6   ?Albumin ?    3.5 - 5.0 g/dL 3.9   ?AST ?    15 - 41 U/L 27   ?ALT ?    0 - 44 U/L 23   ?Alkaline Phosphatase ?    38 - 126 U/L 84   ?Total Bilirubin ?    0.3 - 1.2 mg/dL 0.2 (L)   ?Anion gap ?    5 - 15  8   ?GFR, Estimated ?    >60 mL/min >60   ?  ? ?  Latest Ref Rng & Units 09/17/2021  ?  2:05 PM 03/07/2021  ?  1:54 PM 09/06/2020  ?  2:44 PM  ?CBC  ?WBC 4.0 - 10.5 K/uL 5.6   7.3   5.1    ?Hemoglobin 13.0 - 17.0 g/dL 13.5   13.6   11.6    ?Hematocrit 39.0 - 52.0 % 40.8   40.7   34.1    ?Platelets 150 - 400 K/uL 230   251   218    ?I have reviewed the labs.  ? ?Pertinent Imaging ?N/A ? ?Assessment & Plan:   ? ?Testosterone deficiency  ?-Significant symptoms ?-Recommend starting TRT ?-We discussed the most common forms of replacement including intramuscular injection and gels and he desires to start injections ?-Rx testosterone 20.25 mg / 1.25 g (1.62%) gel, 2 pumps daily ?-Follow-up 6 weeks after starting TRT for testosterone level and symptom check ?-Potential side effects of testosterone replacement were discussed including stimulation of benign prostatic growth with lower urinary tract symptoms; erythrocytosis; edema; gynecomastia; worsening sleep apnea; venous thromboembolism; testicular atrophy and  infertility. Recent studies suggesting an increased incidence of heart attack and stroke in patients taking testosterone was discussed. He was informed there is conflicting evidence regarding the impact of testosterone therap

## 2021-11-12 ENCOUNTER — Encounter: Payer: Self-pay | Admitting: Urology

## 2021-11-12 ENCOUNTER — Ambulatory Visit: Payer: BC Managed Care – PPO | Admitting: Urology

## 2021-11-12 VITALS — BP 129/85 | HR 90 | Ht 74.0 in | Wt 314.0 lb

## 2021-11-12 DIAGNOSIS — E291 Testicular hypofunction: Secondary | ICD-10-CM | POA: Diagnosis not present

## 2021-11-12 DIAGNOSIS — N529 Male erectile dysfunction, unspecified: Secondary | ICD-10-CM | POA: Diagnosis not present

## 2021-11-12 MED ORDER — TESTOSTERONE 20.25 MG/1.25GM (1.62%) TD GEL: 2.0000 | Freq: Every day | TRANSDERMAL | 2 refills | Status: DC

## 2021-11-13 ENCOUNTER — Other Ambulatory Visit
Admission: RE | Admit: 2021-11-13 | Discharge: 2021-11-13 | Disposition: A | Discharge status: Home / Self Care | Payer: BC Managed Care – PPO | Attending: Urology | Admitting: Urology

## 2021-11-13 DIAGNOSIS — E291 Testicular hypofunction: Secondary | ICD-10-CM | POA: Insufficient documentation

## 2021-11-13 LAB — PSA: Prostatic Specific Antigen: 0.23 ng/mL (ref 0.00–4.00)

## 2021-11-14 ENCOUNTER — Ambulatory Visit: Payer: BC Managed Care – PPO | Admitting: Radiation Oncology

## 2021-11-14 LAB — PROLACTIN: Prolactin: 15.7 ng/mL — ABNORMAL HIGH (ref 4.0–15.2)

## 2021-11-14 LAB — LUTEINIZING HORMONE: LH: 12.3 m[IU]/mL — ABNORMAL HIGH (ref 1.7–8.6)

## 2021-11-14 LAB — TESTOSTERONE: Testosterone: 252 ng/dL — ABNORMAL LOW (ref 264–916)

## 2021-11-15 DIAGNOSIS — C20 Malignant neoplasm of rectum: Secondary | ICD-10-CM | POA: Diagnosis not present

## 2021-11-15 DIAGNOSIS — Z933 Colostomy status: Secondary | ICD-10-CM | POA: Diagnosis not present

## 2021-11-16 DIAGNOSIS — C20 Malignant neoplasm of rectum: Secondary | ICD-10-CM | POA: Diagnosis not present

## 2021-11-16 DIAGNOSIS — Z933 Colostomy status: Secondary | ICD-10-CM | POA: Diagnosis not present

## 2021-11-19 ENCOUNTER — Telehealth: Payer: Self-pay

## 2021-11-19 NOTE — Telephone Encounter (Signed)
Patient called looking for information regarding testosterone gel. PA was started on cover my meds. PA pending. KEY:  [id number] ?

## 2021-11-21 NOTE — Telephone Encounter (Signed)
Pt aware and pharmacy aware  

## 2021-11-21 NOTE — Telephone Encounter (Signed)
Approvedon April 18 ?Effective from 11/19/2021 through 11/18/2022. ?

## 2021-11-26 DIAGNOSIS — C20 Malignant neoplasm of rectum: Secondary | ICD-10-CM | POA: Diagnosis not present

## 2021-11-26 DIAGNOSIS — Z933 Colostomy status: Secondary | ICD-10-CM | POA: Diagnosis not present

## 2021-12-11 ENCOUNTER — Encounter: Payer: Self-pay | Admitting: Internal Medicine

## 2021-12-12 ENCOUNTER — Telehealth: Payer: Self-pay

## 2021-12-12 NOTE — Telephone Encounter (Signed)
Pt dropped of [employer] forms to be filled out. Dr. B is ok to fill out. ? ?Working on these forms. ?

## 2021-12-13 DIAGNOSIS — Z933 Colostomy status: Secondary | ICD-10-CM | POA: Diagnosis not present

## 2021-12-13 DIAGNOSIS — C20 Malignant neoplasm of rectum: Secondary | ICD-10-CM | POA: Diagnosis not present

## 2021-12-14 NOTE — Telephone Encounter (Signed)
Forms filled out, placed in Dr. Cain Sieve folder for review and signature. ?

## 2021-12-17 ENCOUNTER — Inpatient Hospital Stay: Payer: BC Managed Care – PPO

## 2021-12-18 NOTE — Telephone Encounter (Signed)
Mychart message sent to the pt about his condition impacting his ability to work. ?

## 2021-12-20 ENCOUNTER — Encounter: Payer: Self-pay | Admitting: Internal Medicine

## 2021-12-20 NOTE — Telephone Encounter (Signed)
Spoke to the pt, questions answered. Faxed forms and office notes to Attn: Caryl Bis [phone]. Pt will pick up original forms at front desk. Copies sent to scan.

## 2021-12-24 ENCOUNTER — Inpatient Hospital Stay: Payer: BC Managed Care – PPO | Attending: Internal Medicine

## 2022-01-14 ENCOUNTER — Ambulatory Visit: Payer: BC Managed Care – PPO | Admitting: Nurse Practitioner

## 2022-01-17 ENCOUNTER — Ambulatory Visit: Payer: BC Managed Care – PPO | Admitting: Physician Assistant

## 2022-01-17 ENCOUNTER — Encounter: Payer: Self-pay | Admitting: Physician Assistant

## 2022-01-17 VITALS — BP 118/84 | HR 63 | Temp 98.8°F | Ht 74.02 in | Wt 315.2 lb

## 2022-01-17 DIAGNOSIS — L989 Disorder of the skin and subcutaneous tissue, unspecified: Secondary | ICD-10-CM

## 2022-01-17 DIAGNOSIS — Z13220 Encounter for screening for lipoid disorders: Secondary | ICD-10-CM

## 2022-01-17 NOTE — Patient Instructions (Addendum)
I would like to check your cholesterol  This would need to be a fasting lab draw so nothing to eat or drink (other than black coffee or water for at least 8 hours before your labs)    We will keep you updated on the results of these labs once available.

## 2022-01-17 NOTE — Progress Notes (Signed)
Established Patient Office Visit  Name: Stephen Vasquez   MRN: [id number]    DOB: 1960-12-22   Date:01/17/2022  Today's Provider: Talitha Givens, MHS, PA-C Introduced myself to the patient as a PA-C and provided education on APPs in clinical practice.         Subjective  Chief Complaint  Chief Complaint  Patient presents with   Referral    Patient states he would like a referral for Dermatology and Cardiology    HPI  Patient would like a referral to Dermatology - States he has a mole on his abdomen near his radiation site that fell off and then returned and is bigger and has heterogenous appearance Noticed it had returned about 3-4 months ago Reports it will get irritated easily - denies bleeding or scabbing  Reports he had blockages on CT scans and is concerned he should see Cardiology  Reports some chest tightness with exertion but this is infrequent    Patient Active Problem List   Diagnosis Date Noted   Iron deficiency anemia due to chronic blood loss 01/04/2020   Goals of care, counseling/discussion 01/04/2020   Rectal cancer ([place]) 12/20/2019   Allergic rhinitis 11/30/2019    Past Surgical History:  Procedure Laterality Date   COLONOSCOPY WITH PROPOFOL N/A 12/15/2019   Procedure: COLONOSCOPY WITH PROPOFOL;  Surgeon: [name], MD;  Location: [facility] ENDOSCOPY;  Service: Gastroenterology;  Laterality: N/A;   COLONOSCOPY WITH PROPOFOL N/A 03/21/2021   Procedure: COLONOSCOPY WITH PROPOFOL;  Surgeon: [name], MD;  Location: [facility] ENDOSCOPY;  Service: Gastroenterology;  Laterality: N/A;   COLONOSCOPY WITH PROPOFOL N/A 03/22/2021   Procedure: COLONOSCOPY WITH PROPOFOL;  Surgeon: [name], MD;  Location: [facility] ENDOSCOPY;  Service: Gastroenterology;  Laterality: N/A;   COLOSTOMY  08/2020   PORTACATH PLACEMENT Left 12/29/2019   Procedure: INSERTION PORT-A-CATH;  Surgeon: Robert Bellow, MD;  Location: [facility] ORS;  Service: General;  Laterality: Left;    Family  History  Problem Relation Age of Onset   Hypertension Mother    Hypertension Father    Stomach cancer Father        in 9s- survived.    Heart disease Brother     Social History   Tobacco Use   Smoking status: Former    Passive exposure: Past   Smokeless tobacco: Never  Substance Use Topics   Alcohol use: Not Currently     Current Outpatient Medications:    Acetaminophen (TYLENOL EXTRA STRENGTH PO), Take 1 tablet by mouth every 8 (eight) hours as needed (pain)., Disp: , Rfl:    azelastine (ASTELIN) 0.1 % nasal spray, Place 2 sprays into both nostrils 2 (two) times daily. Use in each nostril as directed, Disp: 30 mL, Rfl: 12   lidocaine-prilocaine (EMLA) cream, Apply 1 application topically as needed (port access)., Disp: 30 g, Rfl: 3   montelukast (SINGULAIR) 10 MG tablet, Take 1 tablet (10 mg total) by mouth at bedtime., Disp: 30 tablet, Rfl: 3   oxymetazoline (AFRIN) 0.05 % nasal spray, Place 1 spray into both nostrils 2 (two) times daily as needed for congestion., Disp: , Rfl:    tadalafil (CIALIS) 5 MG tablet, Take 1-2 tablets (5-10 mg total) by mouth daily., Disp: 60 tablet, Rfl: 11   Testosterone 20.25 MG/1.25GM (1.62%) GEL, Place 2 Act onto the skin daily., Disp: 150 g, Rfl: 2   Sod Picosulfate-Mag Ox-Cit Acd (CLENPIQ) 10-3.5-12 MG-GM -GM/160ML SOLN, Take 1 bottle at 5 PM followed  by five 8 oz cups of water and repeat 5 hours before procedure. (Patient not taking: Reported on 01/17/2022), Disp: 320 mL, Rfl: 0  No Known Allergies  I personally reviewed active problem list, medication list, allergies, health maintenance with the patient/caregiver today.   Review of Systems  Constitutional:  Negative for malaise/fatigue and weight loss.  Respiratory:  Positive for shortness of breath (intermittent). Negative for cough.   Cardiovascular:  Negative for chest pain, palpitations and leg swelling.  Musculoskeletal:  Negative for back pain.  Skin:        Concerning mole on  abdomen   Neurological:  Negative for dizziness and headaches.      Objective  Vitals:   01/17/22 1506  BP: 118/84  Pulse: 63  Temp: 98.8 F (37.1 C)  TempSrc: Oral  SpO2: 99%  Weight: (!) 315 lb 3.2 oz (143 kg)  Height: 6' 2.02" (1.88 m)    Body mass index is 40.45 kg/m.  Physical Exam Vitals reviewed.  Constitutional:      Appearance: Normal appearance. He is obese.  HENT:     Head: Normocephalic and atraumatic.  Cardiovascular:     Rate and Rhythm: Normal rate and regular rhythm.     Pulses: Normal pulses.     Heart sounds: Normal heart sounds.  Pulmonary:     Effort: Pulmonary effort is normal.     Breath sounds: Normal breath sounds and air entry.  Musculoskeletal:     Right lower leg: No edema.     Left lower leg: No edema.  Skin:    Findings: Lesion present.     Comments: 2 cm long by 1.5 cm wide lesion on abdomen  Light gray brown along outer border with spots  of dark brown/ black along inside. No scabbing or bleeding noted   Neurological:     Mental Status: He is alert.  Psychiatric:        Attention and Perception: Attention and perception normal.        Mood and Affect: Mood and affect normal.        Speech: Speech normal.        Behavior: Behavior normal. Behavior is cooperative.      Recent Results (from the past 2160 hour(s))  Testosterone     Status: Abnormal   Collection Time: 11/02/21  9:15 AM  Result Value Ref Range   Testosterone 181 (L) 264 - 916 ng/dL    Comment: (NOTE) Adult male reference interval is based on a population of healthy nonobese males (BMI <30) between 71 and 62 years old. [place], [place] [phone]. PMID: 39767341. Performed At: [facility] [place], [place] [id number] Rush Farmer MD [id number]   Prolactin     Status: Abnormal   Collection Time: 11/13/21  9:29 AM  Result Value Ref Range   Prolactin 15.7 (H) 4.0 - 15.2 ng/mL    Comment: (NOTE) Performed At: [facility] [place], [place] [id number] Rush Farmer MD [id number]   PSA     Status: None   Collection Time: 11/13/21  9:29 AM  Result Value Ref Range   Prostatic Specific Antigen 0.23 0.00 - 4.00 ng/mL    Comment: (NOTE) While PSA levels of <=4.0 ng/ml are reported as reference range, some men with levels below 4.0 ng/ml can have prostate cancer and many men with PSA above 4.0 ng/ml do not have prostate cancer.  Other tests such as free PSA, age specific reference ranges,  PSA velocity and PSA doubling time may be helpful especially in men less than 70 years old. Performed at [facility], [place] [address], [place], [place] [address]   Luteinizing hormone     Status: Abnormal   Collection Time: 11/13/21  9:29 AM  Result Value Ref Range   LH 12.3 (H) 1.7 - 8.6 mIU/mL    Comment: (NOTE) Performed At: [facility] [place], [place] [id number] Rush Farmer MD [id number]   Testosterone     Status: Abnormal   Collection Time: 11/13/21  9:29 AM  Result Value Ref Range   Testosterone 252 (L) 264 - 916 ng/dL    Comment: (NOTE) Adult male reference interval is based on a population of healthy nonobese males (BMI <30) between 79 and 55 years old. [place], [place] [phone]. PMID: [id number]. Performed At: [facility] [place], [place] [id number] Rush Farmer MD [id number]      PHQ2/9:    01/17/2022    3:14 PM 11/29/2019   11:35 AM  Depression screen PHQ 2/9  Decreased Interest 0 0  Down, Depressed, Hopeless 0 0  PHQ - 2 Score 0 0  Altered sleeping 0 0  Tired, decreased energy 3 0  Change in appetite 0 0  Feeling bad or failure about yourself  0 0  Trouble concentrating 0 0  Moving slowly or fidgety/restless 0 0  Suicidal thoughts 0 0  PHQ-9 Score 3 0  Difficult doing work/chores Somewhat difficult       Fall Risk:    01/17/2022    3:13 PM 11/29/2019   11:35 AM  [place] in the past year? 0 0  Number falls in past yr: 0 0  Injury with Fall? 0 0  Risk for fall due to : No Fall Risks   Follow up Falls evaluation completed       Functional Status Survey:      Assessment & Plan  Problem List Items Addressed This Visit   None Visit Diagnoses     Skin lesions    -  Primary Unsure of chronicity, evolving Reports mole on abdomen is changing and getting bigger.  States it seemed to have fallen off at some point but has returned and is changing Lesion is approx 2cm in horizontal length and 1.5 cm in vertical width  Recommend referral to Dermatology for eval and potential removal  Follow up as needed.    Relevant Orders   Ambulatory referral to Dermatology   Screening for cholesterol level     Chart review did not reveal previous results Patient states he is worried about CVD health due to findings of aortic atherosclerosis on CT scans Provided pt education around cholesterol and heart health (approx 10 minutes of visit were spent on patietn education) Reviewed importance of determining cholesterol, exercise and diet to assist with improving CVD health and potential use of a statin for stabilization pending lab results Results to dictate further management Follow up as needed.    Relevant Orders   Lipid Profile        No follow-ups on file.   I, Clarabel Marion E Nandika Stetzer, PA-C, have reviewed all documentation for this visit. The documentation on 01/17/22 for the exam, diagnosis, procedures, and orders are all accurate and complete.   Talitha Givens, MHS, PA-C [facility]

## 2022-01-22 ENCOUNTER — Ambulatory Visit: Payer: BC Managed Care – PPO | Admitting: Dermatology

## 2022-01-22 DIAGNOSIS — C20 Malignant neoplasm of rectum: Secondary | ICD-10-CM | POA: Diagnosis not present

## 2022-01-22 DIAGNOSIS — Z85048 Personal history of other malignant neoplasm of rectum, rectosigmoid junction, and anus: Secondary | ICD-10-CM

## 2022-01-22 DIAGNOSIS — L820 Inflamed seborrheic keratosis: Secondary | ICD-10-CM | POA: Diagnosis not present

## 2022-01-22 DIAGNOSIS — Z933 Colostomy status: Secondary | ICD-10-CM | POA: Diagnosis not present

## 2022-01-22 NOTE — Progress Notes (Signed)
   New Patient Visit  Subjective  Stephen Vasquez is a 61 y.o. male who presents for the following: Skin Problem (New patient here today for a spot at lower abdomen that he had prior to starting radiation for Stage III rectal cancer just about 1 year ago. The spot went away while doing the radiation but has come back and is bigger and looks different. The spot came back about 3-4 months ago and is in the area that was treated for radiation. No hx of skin cancer. ).   The following portions of the chart were reviewed this encounter and updated as appropriate:       Review of Systems:  No other skin or systemic complaints except as noted in HPI or Assessment and Plan.  Objective  Well appearing patient in no apparent distress; mood and affect are within normal limits.  A focused examination was performed including abdomen. Relevant physical exam findings are noted in the Assessment and Plan.  central lower abdomen Erythematous stuck-on, waxy plaque    Assessment & Plan  Inflamed seborrheic keratosis central lower abdomen  Symptomatic, irritating, patient would like treated.   Destruction of lesion - central lower abdomen  Destruction method: cryotherapy   Informed consent: discussed and consent obtained   Lesion destroyed using liquid nitrogen: Yes   Region frozen until ice ball extended beyond lesion: Yes   Outcome: patient tolerated procedure well with no complications   Post-procedure details: wound care instructions given   Additional details:  Prior to procedure, discussed risks of blister formation, small wound, skin dyspigmentation, or rare scar following cryotherapy. Recommend Vaseline ointment to treated areas while healing.    Return in about 2 months (around 03/24/2022) for ISK follow up.  Graciella Belton, RMA, am acting as scribe for Brendolyn Patty, MD .  Documentation: I have reviewed the above documentation for accuracy and completeness, and I agree with the  above.  Brendolyn Patty MD

## 2022-01-22 NOTE — Patient Instructions (Addendum)

## 2022-01-23 ENCOUNTER — Other Ambulatory Visit: Payer: BC Managed Care – PPO

## 2022-01-23 DIAGNOSIS — Z13220 Encounter for screening for lipoid disorders: Secondary | ICD-10-CM | POA: Diagnosis not present

## 2022-01-24 LAB — LIPID PANEL
Chol/HDL Ratio: 5.1 ratio — ABNORMAL HIGH (ref 0.0–5.0)
Cholesterol, Total: 235 mg/dL — ABNORMAL HIGH (ref 100–199)
HDL: 46 mg/dL (ref >39)
LDL Chol Calc (NIH): 166 mg/dL — ABNORMAL HIGH (ref 0–99)
Triglycerides: 126 mg/dL (ref 0–149)
VLDL Cholesterol Cal: 23 mg/dL (ref 5–40)

## 2022-01-29 ENCOUNTER — Telehealth: Payer: Self-pay | Admitting: *Deleted

## 2022-02-07 ENCOUNTER — Telehealth: Payer: Self-pay

## 2022-02-07 NOTE — Telephone Encounter (Signed)
Patient called stating that he needs Dr.B to fill out a form for his [facility] benefits. Patient states Dr.b has filled form out before but [facility] has lost it. Patient states he will try to drop this form off sometime today 7/6 and would like a team member to reach out to him when he can pick the form back up.

## 2022-02-07 NOTE — Telephone Encounter (Signed)
Pt was given the originals that were filled out and we have copies. Can he make a copy of what he has a give to the [place]?

## 2022-02-07 NOTE — Telephone Encounter (Addendum)
The form was also faxed to Raylene Everts at [phone] on 12/20/21 as requested.    Spoke to patient and he states that [place] is missing page 6 of forms.  The form has been faxed again and copy of original form placed downstairs for pt to pick up at his convenience.

## 2022-02-25 ENCOUNTER — Other Ambulatory Visit: Payer: Self-pay

## 2022-02-28 DIAGNOSIS — Z933 Colostomy status: Secondary | ICD-10-CM | POA: Diagnosis not present

## 2022-02-28 DIAGNOSIS — C20 Malignant neoplasm of rectum: Secondary | ICD-10-CM | POA: Diagnosis not present

## 2022-03-18 ENCOUNTER — Ambulatory Visit: Admission: RE | Admit: 2022-03-18 | Payer: BC Managed Care – PPO | Source: Ambulatory Visit

## 2022-03-18 ENCOUNTER — Inpatient Hospital Stay: Payer: BC Managed Care – PPO | Attending: Internal Medicine

## 2022-03-18 DIAGNOSIS — C20 Malignant neoplasm of rectum: Secondary | ICD-10-CM | POA: Insufficient documentation

## 2022-03-18 LAB — CBC WITH DIFFERENTIAL/PLATELET
Abs Immature Granulocytes: 0.02 10*3/uL (ref 0.00–0.07)
Basophils Absolute: 0 10*3/uL (ref 0.0–0.1)
Basophils Relative: 1 %
Eosinophils Absolute: 0.2 10*3/uL (ref 0.0–0.5)
Eosinophils Relative: 3 %
HCT: 40.6 % (ref 39.0–52.0)
Hemoglobin: 13.4 g/dL (ref 13.0–17.0)
Immature Granulocytes: 0 %
Lymphocytes Relative: 20 %
Lymphs Abs: 1.4 10*3/uL (ref 0.7–4.0)
MCH: 29.6 pg (ref 26.0–34.0)
MCHC: 33 g/dL (ref 30.0–36.0)
MCV: 89.6 fL (ref 80.0–100.0)
Monocytes Absolute: 0.4 10*3/uL (ref 0.1–1.0)
Monocytes Relative: 6 %
Neutro Abs: 4.7 10*3/uL (ref 1.7–7.7)
Neutrophils Relative %: 70 %
Platelets: 195 10*3/uL (ref 150–400)
RBC: 4.53 MIL/uL (ref 4.22–5.81)
RDW: 12.4 % (ref 11.5–15.5)
WBC: 6.7 10*3/uL (ref 4.0–10.5)
nRBC: 0 % (ref 0.0–0.2)

## 2022-03-18 LAB — COMPREHENSIVE METABOLIC PANEL
ALT: 17 U/L (ref 0–44)
AST: 25 U/L (ref 15–41)
Albumin: 4 g/dL (ref 3.5–5.0)
Alkaline Phosphatase: 76 U/L (ref 38–126)
Anion gap: 11 (ref 5–15)
BUN: 24 mg/dL — ABNORMAL HIGH (ref 6–20)
CO2: 25 mmol/L (ref 22–32)
Calcium: 9 mg/dL (ref 8.9–10.3)
Chloride: 100 mmol/L (ref 98–111)
Creatinine, Ser: 1.28 mg/dL — ABNORMAL HIGH (ref 0.61–1.24)
GFR, Estimated: >60 mL/min (ref >60)
Glucose, Bld: 167 mg/dL — ABNORMAL HIGH (ref 70–99)
Potassium: 3.4 mmol/L — ABNORMAL LOW (ref 3.5–5.1)
Sodium: 136 mmol/L (ref 135–145)
Total Bilirubin: 0.4 mg/dL (ref 0.3–1.2)
Total Protein: 7.6 g/dL (ref 6.5–8.1)

## 2022-03-18 MED ORDER — HEPARIN SOD (PORK) LOCK FLUSH 100 UNIT/ML IV SOLN
500.0000 [IU] | Freq: Once | INTRAVENOUS | Status: AC
  Administered 2022-03-18: 500 [IU] via INTRAVENOUS
  Filled 2022-03-18: qty 5

## 2022-03-19 LAB — CEA: CEA: 1.9 ng/mL (ref 0.0–4.7)

## 2022-03-20 ENCOUNTER — Inpatient Hospital Stay: Payer: BC Managed Care – PPO | Admitting: Internal Medicine

## 2022-03-22 DIAGNOSIS — Z933 Colostomy status: Secondary | ICD-10-CM | POA: Diagnosis not present

## 2022-03-22 DIAGNOSIS — C20 Malignant neoplasm of rectum: Secondary | ICD-10-CM | POA: Diagnosis not present

## 2022-03-26 ENCOUNTER — Ambulatory Visit: Payer: BC Managed Care – PPO | Admitting: Dermatology

## 2022-03-26 DIAGNOSIS — D2372 Other benign neoplasm of skin of left lower limb, including hip: Secondary | ICD-10-CM

## 2022-03-26 DIAGNOSIS — L820 Inflamed seborrheic keratosis: Secondary | ICD-10-CM

## 2022-03-26 DIAGNOSIS — L814 Other melanin hyperpigmentation: Secondary | ICD-10-CM | POA: Diagnosis not present

## 2022-03-26 NOTE — Progress Notes (Addendum)
   Follow-Up Visit   Subjective  Stephen Vasquez is a 61 y.o. male who presents for the following: Seborrheic Keratosis (Patient reports spots at abdomen has resolved also reports a spot at left lower leg that he would like checked.).  It itches at times, but not bothersome.  The patient has spots, moles and lesions to be evaluated, some may be new or changing and the patient has concerns that these could be cancer.   The following portions of the chart were reviewed this encounter and updated as appropriate:      Review of Systems: No other skin or systemic complaints except as noted in HPI or Assessment and Plan.   Objective  Well appearing patient in no apparent distress; mood and affect are within normal limits.  A focused examination was performed including abdomen, legs. Relevant physical exam findings are noted in the Assessment and Plan.  Suprapubic Area Pink patch, no residual lesion   Assessment & Plan  Inflamed seborrheic keratosis Suprapubic Area  Clear, S/p cryotherapy with resolution  Dermatofibroma Left pretibia  0.7 cm firm pink/brown nodule  - Firm pink/brown papulenodule with dimple sign - Benign appearing.  A dermatofibroma is a benign growth possibly related to trauma, such as an insect bite or inflamed acne-type bump.  Discussed removal (shave vrs excision) with resulting scar and risk of recurrence.  Since not bothersome, will observe for now.  - Call for any changes  Lentigines - Scattered tan macules - Due to sun exposure - Benign-appering, observe - Recommend daily broad spectrum sunscreen SPF 30+ to sun-exposed areas, reapply every 2 hours as needed. - Call for any changes   Return if symptoms worsen or fail to improve. I, Ruthell Rummage, CMA, am acting as scribe for Brendolyn Patty, MD.  Documentation: I have reviewed the above documentation for accuracy and completeness, and I agree with the above.  Brendolyn Patty MD

## 2022-03-26 NOTE — Patient Instructions (Addendum)
At left lower leg A dermatofibroma is a benign growth possibly related to trauma, such as an insect bite or inflamed acne-type bump.  Discussed removal (shave vrs excision) with resulting scar and risk of recurrence.  Since not bothersome, will observe for now.    Due to recent changes in healthcare laws, you may see results of your pathology and/or laboratory studies on MyChart before the doctors have had a chance to review them. We understand that in some cases there may be results that are confusing or concerning to you. Please understand that not all results are received at the same time and often the doctors may need to interpret multiple results in order to provide you with the best plan of care or course of treatment. Therefore, we ask that you please give [place] 2 business days to thoroughly review all your results before contacting the office for clarification. Should we see a critical lab result, you will be contacted sooner.   If You Need Anything After Your Visit  If you have any questions or concerns for your doctor, please call our main line at [phone] and press option 4 to reach your doctor's medical assistant. If no one answers, please leave a voicemail as directed and we will return your call as soon as possible. Messages left after 4 pm will be answered the following business day.   You may also send [place] a message via [place]. We typically respond to MyChart messages within 1-2 business days.  For prescription refills, please ask your pharmacy to contact our office. Our fax number is [phone].  If you have an urgent issue when the clinic is closed that cannot wait until the next business day, you can page your doctor at the number below.    Please note that while we do our best to be available for urgent issues outside of office hours, we are not available 24/7.   If you have an urgent issue and are unable to reach [place], you may choose to seek medical care at your doctor's  office, retail clinic, urgent care center, or emergency room.  If you have a medical emergency, please immediately call 911 or go to the emergency department.  Pager Numbers  - Dr. Nehemiah Massed: [phone]  - Dr. Laurence Ferrari: [phone]  - Dr. Nicole Kindred: [phone]  In the event of inclement weather, please call our main line at [phone] for an update on the status of any delays or closures.  Dermatology Medication Tips: Please keep the boxes that topical medications come in in order to help keep track of the instructions about where and how to use these. Pharmacies typically print the medication instructions only on the boxes and not directly on the medication tubes.   If your medication is too expensive, please contact our office at [phone] option 4 or send [place] a message through [place].   We are unable to tell what your co-pay for medications will be in advance as this is different depending on your insurance coverage. However, we may be able to find a substitute medication at lower cost or fill out paperwork to get insurance to cover a needed medication.   If a prior authorization is required to get your medication covered by your insurance company, please allow [place] 1-2 business days to complete this process.  Drug prices often vary depending on where the prescription is filled and some pharmacies may offer cheaper prices.  The website www.goodrx.com contains coupons for medications through different pharmacies. The prices here  do not account for what the cost may be with help from insurance (it may be cheaper with your insurance), but the website can give you the price if you did not use any insurance.  - You can print the associated coupon and take it with your prescription to the pharmacy.  - You may also stop by our office during regular business hours and pick up a GoodRx coupon card.  - If you need your prescription sent electronically to a different pharmacy, notify our office  through [facility] or by phone at [phone] option 4.     Si Usted Necesita Algo Despus de Su Visita  Tambin puede enviarnos un mensaje a travs de Pharmacist, community. Por lo general respondemos a los mensajes de MyChart en el transcurso de 1 a 2 das hbiles.  Para renovar recetas, por favor pida a su farmacia que se ponga en contacto con nuestra oficina. Harland Dingwall de fax es [place] [phone].  Si tiene un asunto urgente cuando la clnica est cerrada y que no puede esperar hasta el siguiente da hbil, puede llamar/localizar a su doctor(a) al nmero que aparece a continuacin.   Por favor, tenga en cuenta que aunque hacemos todo lo posible para estar disponibles para asuntos urgentes fuera del horario de [place], no estamos disponibles las 24 horas del da, los 7 das de la [place].   Si tiene un problema urgente y no puede comunicarse con nosotros, puede optar por buscar atencin mdica  en el consultorio de su doctor(a), en una clnica privada, en un centro de atencin urgente o en una sala de emergencias.  Si tiene Engineering geologist, por favor llame inmediatamente al 911 o vaya a la sala de emergencias.  Nmeros de bper  - Dr. Nehemiah Massed: [phone]  - Dra. Moye: [phone]  - Dra. Nicole Kindred: [phone]  En caso de inclemencias del [place], por favor llame a Johnsie Kindred principal al [phone] para una actualizacin sobre el [place] de cualquier retraso o cierre.  Consejos para la medicacin en dermatologa: Por favor, guarde las cajas en las que vienen los medicamentos de uso tpico para ayudarle a seguir las instrucciones sobre dnde y cmo usarlos. Las farmacias generalmente imprimen las instrucciones del medicamento slo en las cajas y no directamente en los tubos del [place].   Si su medicamento es muy caro, por favor, pngase en contacto con Zigmund Daniel llamando al [phone] y presione la opcin 4 o envenos un mensaje a travs de Pharmacist, community.   No  podemos decirle cul ser su copago por los medicamentos por adelantado ya que esto es diferente dependiendo de la cobertura de su seguro. Sin embargo, es posible que podamos encontrar un medicamento sustituto a Electrical engineer un formulario para que el seguro cubra el medicamento que se considera necesario.   Si se requiere una autorizacin previa para que su compaa de seguros [place] su medicamento, por favor permtanos de 1 a 2 das hbiles para completar este proceso.  Los precios de los medicamentos varan con frecuencia dependiendo del Environmental consultant de dnde se surte la receta y alguna farmacias pueden ofrecer precios ms baratos.  El sitio web www.goodrx.com tiene cupones para medicamentos de Airline pilot. Los precios aqu no tienen en cuenta lo que podra costar con la ayuda del seguro (puede ser ms barato con su seguro), pero el sitio web puede darle el precio si no utiliz Research scientist (physical sciences).  - Puede imprimir el cupn correspondiente y llevarlo con su receta a la farmacia.  -  Tambin puede pasar por nuestra oficina durante el horario de atencin regular y Charity fundraiser una tarjeta de cupones de GoodRx.  - Si necesita que su receta se enve electrnicamente a una farmacia diferente, informe a nuestra oficina a travs de MyChart de [place] o por telfono llamando al [phone] y presione la opcin 4.

## 2022-03-27 ENCOUNTER — Ambulatory Visit
Admission: RE | Admit: 2022-03-27 | Discharge: 2022-03-27 | Disposition: A | Discharge status: Home / Self Care | Payer: BC Managed Care – PPO | Source: Ambulatory Visit | Attending: Internal Medicine | Admitting: Internal Medicine

## 2022-03-27 DIAGNOSIS — C20 Malignant neoplasm of rectum: Secondary | ICD-10-CM | POA: Diagnosis not present

## 2022-03-27 DIAGNOSIS — Z933 Colostomy status: Secondary | ICD-10-CM | POA: Diagnosis not present

## 2022-03-27 DIAGNOSIS — I700 Atherosclerosis of aorta: Secondary | ICD-10-CM | POA: Diagnosis not present

## 2022-03-27 DIAGNOSIS — I2510 Atherosclerotic heart disease of native coronary artery without angina pectoris: Secondary | ICD-10-CM | POA: Diagnosis not present

## 2022-03-27 DIAGNOSIS — Z85048 Personal history of other malignant neoplasm of rectum, rectosigmoid junction, and anus: Secondary | ICD-10-CM | POA: Diagnosis not present

## 2022-03-27 DIAGNOSIS — R918 Other nonspecific abnormal finding of lung field: Secondary | ICD-10-CM | POA: Diagnosis not present

## 2022-03-27 MED ORDER — IOHEXOL 300 MG/ML  SOLN
100.0000 mL | Freq: Once | INTRAMUSCULAR | Status: AC | PRN
  Administered 2022-03-27: 100 mL via INTRAVENOUS

## 2022-04-02 ENCOUNTER — Encounter: Payer: Self-pay | Admitting: Internal Medicine

## 2022-04-02 ENCOUNTER — Inpatient Hospital Stay (HOSPITAL_BASED_OUTPATIENT_CLINIC_OR_DEPARTMENT_OTHER): Payer: BC Managed Care – PPO | Admitting: Internal Medicine

## 2022-04-02 VITALS — BP 114/71 | HR 74 | Temp 96.3°F | Ht 74.02 in | Wt 311.4 lb

## 2022-04-02 DIAGNOSIS — C20 Malignant neoplasm of rectum: Secondary | ICD-10-CM | POA: Diagnosis not present

## 2022-04-02 MED ORDER — AZITHROMYCIN 250 MG PO TABS: ORAL_TABLET | ORAL | 0 refills | Status: AC

## 2022-04-02 NOTE — Progress Notes (Signed)
No concerns. 

## 2022-04-02 NOTE — Assessment & Plan Note (Addendum)
#   Rectal adenocarcinoma- moderate differentiated; stage III [multiple pelvic lymph nodes]; ? borderline 9 mm retroperitoneal LN.  S/P total neoadjuvant chemotherapy/radiation therapy-s/p APR- JAN 2022 [[facility]]- pathologic CR. AUG 2023- CT CAP-no evidence of any recurrent disease; stable/similar mild presacral soft tissue stranding/thickening favored reflect posttreatment change.  #Acute bronchitis-  Ground-glass 8 mm opacity in the peripheral right upper lobe - favored infectious or inflammatory.  Start patient on Z-Pak.  We will repeat imaging again in 6 months.  # AUG 2022- COLON POLYP [Dr.Anna]  TUBULAR ADENOMA; - NEGATIVE FOR HIGH-GRADE DYSPLASIA AND MALIGNANCY. Repeat colo in AUG 2025 [again 3 years].  #IV access: Mediport in place- STABLE. Discussed the pros and cons of continued Mediport.  Plan keeping it for now; flush every 3 months.  Will decide explanting off next visit   DISPOSITION:  # port flush in 3 months # follow up in 6 months- PORT FLUSH; cbc/cmp/CEA; CT CAP -Dr.B   # I reviewed the blood work- with the patient in detail; also reviewed the imaging independently [as summarized above]; and with the patient in detail.

## 2022-04-02 NOTE — Progress Notes (Signed)
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 CEA- 87. CT C/A/P- Large mass involving the rectum is identified compatible with primary rectal carcinoma. This has a large, 6.7 cm exophytic component in the left pelvis. Multiple perirectal lymph nodes are identified compatible with metastatic adenopathy; Borderline enlarged aortocaval node measures 0.9 cm; PET-bulky rectal mass; multiple lymph nodes noted; no uptake in the retroperitoneal lymph nodes; MRI rectum- T3N2M0;   Pretreatment CEA 94; TNT  # June 1st 2021- FOLFOX T3N2M0;   Pretreatment CEA 94.  S/p TNT [total neoadjuvant therapy]; currently s/p FOLFOX chemotherapy. CEA- pre-treatment- ~100; CEA- 2.0; also s/p 5FU- CIV M-F; RT [ 9/20-10/25]. JAN 8th, 2022- APR-COMPLETE pathologic response. [Dr.Sadiq; [facility]]  # # AUG 2022- COLON POLYP [Dr.Anna]  TUBULAR ADENOMA; - NEGATIVE FOR HIGH-GRADE DYSPLASIA AND MALIGNANCY. Repeat colo in AUG 2022 [again 3 years]  # SURVIVORSHIP:   # GENETICS:   DIAGNOSIS: RECTAL CA  STAGE:   III     ;  GOALS: cure  CURRENT/MOST RECENT THERAPY : Surveillance   Rectal cancer ([place])  12/20/2019 Initial Diagnosis   Rectal cancer ([place])   01/04/2020 - 04/11/2020 Chemotherapy   The patient had dexamethasone (DECADRON)  4 MG tablet, 8 mg, Oral, Daily, 1 of 1 cycle, Start date: --, End date: -- palonosetron (ALOXI) injection 0.25 mg, 0.25 mg, Intravenous,  Once, 8 of 8 cycles Administration: 0.25 mg (01/04/2020), 0.25 mg (01/17/2020), 0.25 mg (01/31/2020), 0.25 mg (02/14/2020), 0.25 mg (02/28/2020), 0.25 mg (03/13/2020), 0.25 mg (04/11/2020) pegfilgrastim (NEULASTA ONPRO KIT) injection 6 mg, 6 mg, Subcutaneous, Once, 2 of 2 cycles Administration: 6 mg (02/02/2020) leucovorin 1,000 mg in dextrose 5 % 250 mL infusion, 1,008 mg, Intravenous,  Once, 8 of 8 cycles Administration: 1,000 mg (01/04/2020), 1,000 mg (01/17/2020), 1,000 mg (01/31/2020), 1,000 mg (02/14/2020), 1,000 mg (02/28/2020), 1,000 mg (03/13/2020), 1,000 mg (04/11/2020) oxaliplatin (ELOXATIN) 200 mg in dextrose 5 % 500 mL chemo infusion, 215 mg, Intravenous,  Once, 8 of 8 cycles Administration: 200 mg (01/04/2020), 200 mg (01/17/2020), 200 mg (01/31/2020), 200 mg (02/14/2020), 200 mg (02/28/2020), 200 mg (03/13/2020), 200 mg (04/11/2020) fluorouracil (ADRUCIL) chemo injection 1,000 mg, 400 mg/m2 = 1,000 mg, Intravenous,  Once, 2 of 2 cycles Administration: 1,000 mg (01/04/2020), 1,000 mg (01/17/2020) fluorouracil (ADRUCIL) 6,050 mg in sodium chloride 0.9 % 129 mL chemo infusion, 2,400 mg/m2 = 6,050 mg, Intravenous, 1 Day/Dose, 8 of 8 cycles Administration: 6,050 mg (01/04/2020), 6,050 mg (01/17/2020), 6,050 mg (01/31/2020), 6,050 mg (02/14/2020), 6,050 mg (02/28/2020), 6,050 mg (03/13/2020), 6,050 mg (04/11/2020)  for chemotherapy treatment.    04/24/2020 - 05/26/2020 Chemotherapy   Patient is on Treatment Plan : RECTAL 5FU IVCI D1-5 + XRT        HISTORY OF PRESENTING ILLNESS: Ambulating independently.  Alone.  Zubin Pontillo 61 y.o.  male  With stage III rectal cancer currently s/p TNT-FOLFOX chemotherapy & s/p chemoradiation May 29, 2020-s/p surgery at [facility] in January 2022-is here today with results of  the surveillance CT scan.  Patient admits to mild cough sputum over the last few  days.  No fever no chills.  No nausea no vomiting. Colostomy working well.   Denies any significant tingling or numbness.   Review of Systems  Constitutional:  Positive for malaise/fatigue. Negative for chills, diaphoresis, fever and weight loss.  HENT:  Negative for nosebleeds and sore throat.   Eyes:  Negative for double vision.  Respiratory:  Negative for cough, hemoptysis, sputum production, shortness of breath and wheezing.   Cardiovascular:  Negative for chest pain, palpitations, orthopnea and leg swelling.  Gastrointestinal:  Negative for abdominal pain, constipation, diarrhea, heartburn, melena, nausea and vomiting.  Genitourinary:  Negative for dysuria, frequency and urgency.  Musculoskeletal:  Negative for back pain.  Skin: Negative.  Negative for itching and rash.  Neurological:  Negative for dizziness, focal weakness, weakness and headaches.  Endo/Heme/Allergies:  Does not bruise/bleed easily.  Psychiatric/Behavioral:  Negative for depression. The patient is not nervous/anxious and does not have insomnia.      MEDICAL HISTORY:  Past Medical History:  Diagnosis Date   Allergic rhinitis    Heart murmur    Rectal cancer (HCC)     SURGICAL HISTORY: Past Surgical History:  Procedure Laterality Date   COLONOSCOPY WITH PROPOFOL N/A 12/15/2019   Procedure: COLONOSCOPY WITH PROPOFOL;  Surgeon: Anna, Kiran, MD;  Location: [facility] ENDOSCOPY;  Service: Gastroenterology;  Laterality: N/A;   COLONOSCOPY WITH PROPOFOL N/A 03/21/2021   Procedure: COLONOSCOPY WITH PROPOFOL;  Surgeon: Anna, Kiran, MD;  Location: [facility] ENDOSCOPY;  Service: Gastroenterology;  Laterality: N/A;   COLONOSCOPY WITH PROPOFOL N/A 03/22/2021   Procedure: COLONOSCOPY WITH PROPOFOL;  Surgeon: Anna, Kiran, MD;  Location: [facility] ENDOSCOPY;  Service: Gastroenterology;  Laterality: N/A;   COLOSTOMY  08/2020   PORTACATH PLACEMENT Left 12/29/2019   Procedure: INSERTION PORT-A-CATH;  Surgeon: Byrnett, Jeffrey W, MD;  Location:  [facility] ORS;  Service: General;  Laterality: Left;    SOCIAL HISTORY: Social History   Socioeconomic History   Marital status: Married    Spouse name: Not on file   Number of children: Not on file   Years of education: Not on file   Highest education level: Not on file  Occupational History   Not on file  Tobacco Use   Smoking status: Former    Passive exposure: Past   Smokeless tobacco: Never  Vaping Use   Vaping Use: Never used  Substance and Sexual Activity   Alcohol use: Not Currently   Drug use: Never   Sexual activity: Yes  Other Topics Concern   Not on file  Social History Narrative   Lives in [place]; manager at [employer]; 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 2 children- in boy and girls in late 30s.    Social Determinants of Health   Financial Resource Strain: Not on file  Food Insecurity: Not on file  Transportation Needs: Not on file  Physical Activity: Not on file  Stress: Not on file  Social Connections: Not on file  Intimate Partner Violence: Not on file    FAMILY HISTORY: Family History  Problem Relation Age of Onset   Hypertension Mother    Hypertension Father    Stomach cancer Father        in 40s- survived.    Heart disease Brother     ALLERGIES:  has No Known Allergies.  MEDICATIONS:  Current Outpatient Medications  Medication Sig Dispense Refill   Acetaminophen (TYLENOL EXTRA STRENGTH PO) Take   1 tablet by mouth every 8 (eight) hours as needed (pain).     azelastine (ASTELIN) 0.1 % nasal spray Place 2 sprays into both nostrils 2 (two) times daily. Use in each nostril as directed 30 mL 12   azithromycin (ZITHROMAX) 250 MG tablet Take 2 on day 1; and then 1 pill once a day. 6 each 0   lidocaine-prilocaine (EMLA) cream Apply 1 application topically as needed (port access). 30 g 3   montelukast (SINGULAIR) 10 MG tablet Take 1 tablet (10 mg total) by mouth at bedtime. 30 tablet 3   oxymetazoline (AFRIN) 0.05 % nasal spray Place 1 spray into  both nostrils 2 (two) times daily as needed for congestion.     tadalafil (CIALIS) 5 MG tablet Take 1-2 tablets (5-10 mg total) by mouth daily. 60 tablet 11   Testosterone 20.25 MG/1.25GM (1.62%) GEL Place 2 Act onto the skin daily. 150 g 2   Sod Picosulfate-Mag Ox-Cit Acd (CLENPIQ) 10-3.5-12 MG-GM -GM/160ML SOLN Take 1 bottle at 5 PM followed by five 8 oz cups of water and repeat 5 hours before procedure. (Patient not taking: Reported on 01/17/2022) 320 mL 0   No current facility-administered medications for this visit.      .  PHYSICAL EXAMINATION: ECOG PERFORMANCE STATUS: 1 - Symptomatic but completely ambulatory  Vitals:   04/02/22 0926  BP: 114/71  Pulse: 74  Temp: (!) 96.3 F (35.7 C)  SpO2: 98%   Filed Weights   04/02/22 0926  Weight: (!) 311 lb 6.4 oz (141.3 kg)    Physical Exam Constitutional:      Comments: He is alone.   Walk independently.  HENT:     Head: Normocephalic and atraumatic.     Mouth/Throat:     Pharynx: No oropharyngeal exudate.  Eyes:     Pupils: Pupils are equal, round, and reactive to light.  Cardiovascular:     Rate and Rhythm: Normal rate and regular rhythm.  Pulmonary:     Effort: Pulmonary effort is normal. No respiratory distress.     Breath sounds: Normal breath sounds. No wheezing.  Abdominal:     General: Bowel sounds are normal. There is no distension.     Palpations: Abdomen is soft. There is no mass.     Tenderness: There is no abdominal tenderness. There is no guarding or rebound.     Comments: Positive for colostomy.  Musculoskeletal:        General: No tenderness. Normal range of motion.     Cervical back: Normal range of motion and neck supple.  Skin:    General: Skin is warm.  Neurological:     Mental Status: He is alert and oriented to person, place, and time.  Psychiatric:        Mood and Affect: Affect normal.      LABORATORY DATA:  I have reviewed the data as listed Lab Results  Component Value Date   WBC  6.7 03/18/2022   HGB 13.4 03/18/2022   HCT 40.6 03/18/2022   MCV 89.6 03/18/2022   PLT 195 03/18/2022   Recent Labs    09/17/21 1405 03/18/22 0934  NA 135 136  K 3.8 3.4*  CL 104 100  CO2 23 25  GLUCOSE 142* 167*  BUN 25* 24*  CREATININE 1.28* 1.28*  CALCIUM 9.0 9.0  GFRNONAA >60 >60  PROT 7.6 7.6  ALBUMIN 3.9 4.0  AST 27 25  ALT 23 17  ALKPHOS 84 76  BILITOT 0.2* 0.4      RADIOGRAPHIC STUDIES: I have personally reviewed the radiological images as listed and agreed with the findings in the report. CT CHEST ABDOMEN PELVIS W CONTRAST  Result Date: 03/27/2022 CLINICAL DATA:  History of rectal cancer, follow-up/monitoring. * Tracking Code: BO * EXAM: CT CHEST, ABDOMEN, AND PELVIS WITH CONTRAST TECHNIQUE: Multidetector CT imaging of the chest, abdomen and pelvis was performed following the standard protocol during bolus administration of intravenous contrast. RADIATION DOSE REDUCTION: This exam was performed according to the departmental dose-optimization program which includes automated exposure control, adjustment of the mA and/or kV according to patient size and/or use of iterative reconstruction technique. CONTRAST:  164m OMNIPAQUE IOHEXOL 300 MG/ML  SOLN COMPARISON:  Multiple priors including most recent CT chest abdomen and pelvis dated September 13, 2021 FINDINGS: CT CHEST FINDINGS Cardiovascular: Left chest Port-A-Cath. Aortic atherosclerosis. No central pulmonary embolus on this nondedicated study. Coronary artery calcifications. Normal size heart. No significant pericardial effusion/thickening. Mediastinum/Nodes: No suspicious thyroid nodule. No pathologically enlarged mediastinal, hilar or axillary lymph nodes. Esophagus is grossly unremarkable. Lungs/Pleura: Small ground-glass opacity in the peripheral right upper lobe measures 8 mm on image 90/5. Hypoventilatory change in the dependent lungs. No pleural effusion. No pneumothorax. Musculoskeletal: No aggressive lytic or blastic  lesion of bone. CT ABDOMEN PELVIS FINDINGS Hepatobiliary: No suspicious hepatic lesion. Gallbladder is unremarkable. No biliary ductal dilation. Pancreas: No pancreatic ductal dilation or evidence of acute inflammation. Spleen: No splenomegaly or focal splenic lesion. Adrenals/Urinary Tract: Bilateral adrenal glands appear normal. No hydronephrosis. Kidneys demonstrate symmetric enhancement and excretion of contrast material. Urinary bladder is unremarkable for degree of distension. Stomach/Bowel: Radiopaque enteric contrast material traverses distal loops of small bowel. Stomach is unremarkable for degree of distension. No pathologic dilation of small or large bowel. The appendix and terminal ileum appear normal. 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 Vascular/Lymphatic: Aortic athero sclerosis. Unchanged size of the previously indexed aortocaval lymph node measuring 3 mm on image 88/2. Previously indexed left external iliac lymph node is now too small to accurately characterize. No new or progressive adenopathy in the abdomen or pelvis. Reproductive: Prostate gland is unremarkable. Other: Similar mild presacral soft tissue stranding/thickening likely posttreatment related. No significant abdominopelvic free fluid. No discrete peritoneal or omental nodularity. Musculoskeletal: No aggressive lytic or blastic lesion of bone. IMPRESSION: 1. Stable examination status post prior left hemicolectomy and left anterior abdominal wall colostomy formation with similar mild presacral soft tissue stranding/thickening favored reflect posttreatment change. 2. Ground-glass 8 mm opacity in the peripheral right upper lobe is favored infectious or inflammatory. Consider attention on short-term interval follow-up dedicated chest CT. 3. No convincing evidence of new or progressive disease in the chest, abdomen or pelvis. 4.  Aortic Atherosclerosis (ICD10-I70.0). Electronically Signed   By: JDahlia BailiffM.D.   On: 03/27/2022 14:54    ASSESSMENT & PLAN:   [place] ([place] # Rectal adenocarcinoma- moderate differentiated; stage III [multiple pelvic lymph nodes]; ? borderline 9 mm retroperitoneal LN.  S/P total neoadjuvant chemotherapy/radiation therapy-s/p APR- JAN 2022 [[facility]]- pathologic CR. AUG 2023- CT CAP-no evidence of any recurrent disease; stable/similar mild presacral soft tissue stranding/thickening favored reflect posttreatment change.  #Acute bronchitis-  Ground-glass 8 mm opacity in the peripheral right upper lobe - favored infectious or inflammatory.  Start patient on Z-Pak.  We will repeat imaging again in 6 months.  # AUG 2022- COLON POLYP [Dr.Anna]  TUBULAR ADENOMA; - NEGATIVE FOR HIGH-GRADE DYSPLASIA AND MALIGNANCY. Repeat colo in AUG 2025 [again 3 years].   #  IV access: Mediport in place- STABLE. Discussed the pros and cons of continued Mediport.  Plan keeping it for now; flush every 3 months.  Will decide explanting off next visit   DISPOSITION:  # port flush in 3 months # follow up in 6 months- PORT FLUSH; cbc/cmp/CEA; CT CAP -Dr.B   # I reviewed the blood work- with the patient in detail; also reviewed the imaging independently [as summarized above]; and with the patient in detail.         All questions were answered. The patient knows to call the clinic with any problems, questions or concerns.    Cammie Sickle, MD 04/02/2022 10:19 AM

## 2022-04-25 DIAGNOSIS — C20 Malignant neoplasm of rectum: Secondary | ICD-10-CM | POA: Diagnosis not present

## 2022-04-25 DIAGNOSIS — Z933 Colostomy status: Secondary | ICD-10-CM | POA: Diagnosis not present

## 2022-05-23 DIAGNOSIS — Z933 Colostomy status: Secondary | ICD-10-CM | POA: Diagnosis not present

## 2022-05-23 DIAGNOSIS — C20 Malignant neoplasm of rectum: Secondary | ICD-10-CM | POA: Diagnosis not present

## 2022-05-24 ENCOUNTER — Other Ambulatory Visit: Payer: Self-pay | Admitting: Urology

## 2022-05-24 DIAGNOSIS — E291 Testicular hypofunction: Secondary | ICD-10-CM

## 2022-05-29 DIAGNOSIS — C20 Malignant neoplasm of rectum: Secondary | ICD-10-CM | POA: Diagnosis not present

## 2022-05-29 DIAGNOSIS — Z933 Colostomy status: Secondary | ICD-10-CM | POA: Diagnosis not present

## 2022-06-03 ENCOUNTER — Other Ambulatory Visit: Payer: Self-pay

## 2022-06-03 DIAGNOSIS — E291 Testicular hypofunction: Secondary | ICD-10-CM

## 2022-06-04 ENCOUNTER — Other Ambulatory Visit: Payer: BC Managed Care – PPO

## 2022-06-04 DIAGNOSIS — E291 Testicular hypofunction: Secondary | ICD-10-CM

## 2022-06-04 DIAGNOSIS — Z125 Encounter for screening for malignant neoplasm of prostate: Secondary | ICD-10-CM | POA: Diagnosis not present

## 2022-06-05 ENCOUNTER — Telehealth: Payer: Self-pay | Admitting: *Deleted

## 2022-06-05 ENCOUNTER — Other Ambulatory Visit: Payer: Self-pay | Admitting: Urology

## 2022-06-05 DIAGNOSIS — E291 Testicular hypofunction: Secondary | ICD-10-CM

## 2022-06-05 LAB — PSA: Prostate Specific Ag, Serum: 0.5 ng/mL (ref 0.0–4.0)

## 2022-06-05 LAB — TESTOSTERONE: Testosterone: 250 ng/dL — ABNORMAL LOW (ref 264–916)

## 2022-06-05 LAB — HEMOGLOBIN AND HEMATOCRIT, BLOOD
Hematocrit: 41.4 % (ref 37.5–51.0)
Hemoglobin: 13.8 g/dL (ref 13.0–17.7)

## 2022-06-05 MED ORDER — TESTOSTERONE 20.25 MG/1.25GM (1.62%) TD GEL: 3.0000 | Freq: Every day | TRANSDERMAL | 2 refills | Status: AC

## 2022-06-05 NOTE — Telephone Encounter (Signed)
Your testosterone level is not within the therapeutic range.  Are you applying the gel?    Patient called back today and states he is using the gel. He states 2 daily. Has not missed any

## 2022-06-26 DIAGNOSIS — C20 Malignant neoplasm of rectum: Secondary | ICD-10-CM | POA: Diagnosis not present

## 2022-06-26 DIAGNOSIS — Z933 Colostomy status: Secondary | ICD-10-CM | POA: Diagnosis not present

## 2022-07-03 ENCOUNTER — Inpatient Hospital Stay: Payer: BC Managed Care – PPO | Attending: Internal Medicine

## 2022-07-03 DIAGNOSIS — Z452 Encounter for adjustment and management of vascular access device: Secondary | ICD-10-CM | POA: Diagnosis not present

## 2022-07-03 DIAGNOSIS — Z95828 Presence of other vascular implants and grafts: Secondary | ICD-10-CM

## 2022-07-03 DIAGNOSIS — C20 Malignant neoplasm of rectum: Secondary | ICD-10-CM | POA: Insufficient documentation

## 2022-07-03 MED ORDER — SODIUM CHLORIDE 0.9% FLUSH
10.0000 mL | Freq: Once | INTRAVENOUS | Status: AC
  Administered 2022-07-03: 10 mL via INTRAVENOUS
  Filled 2022-07-03: qty 10

## 2022-07-03 MED ORDER — HEPARIN SOD (PORK) LOCK FLUSH 100 UNIT/ML IV SOLN
500.0000 [IU] | Freq: Once | INTRAVENOUS | Status: AC
  Administered 2022-07-03: 500 [IU] via INTRAVENOUS
  Filled 2022-07-03: qty 5

## 2022-07-08 ENCOUNTER — Other Ambulatory Visit: Payer: Self-pay

## 2022-07-08 DIAGNOSIS — E291 Testicular hypofunction: Secondary | ICD-10-CM

## 2022-07-09 ENCOUNTER — Other Ambulatory Visit: Payer: BC Managed Care – PPO

## 2022-07-09 DIAGNOSIS — E291 Testicular hypofunction: Secondary | ICD-10-CM

## 2022-07-10 ENCOUNTER — Telehealth: Payer: Self-pay

## 2022-07-10 LAB — TESTOSTERONE: Testosterone: 286 ng/dL (ref 264–916)

## 2022-07-10 NOTE — Telephone Encounter (Signed)
Patient notified and states he has been using the gel daily. He is interested in trying the Testopel. I will try to see if I can get it approved.

## 2022-07-10 NOTE — Telephone Encounter (Signed)
-----   Message from Nori Riis, PA-C sent at 07/10/2022  8:02 AM EST ----- Please let Stephen Vasquez know that his testosterone still remains subtherapeutic.  If he has been applying the gel, I do recommend that we switch to a different treatment modality as this is not going to work for him.  If he has not been applying the gel, is it because it has been difficult for him to acquire the medication or difficult remembering to apply?

## 2022-07-10 NOTE — Telephone Encounter (Signed)
LMOM for pt to return call. Sending pt Mychart msg.

## 2022-07-17 DIAGNOSIS — Z933 Colostomy status: Secondary | ICD-10-CM | POA: Diagnosis not present

## 2022-07-17 DIAGNOSIS — C20 Malignant neoplasm of rectum: Secondary | ICD-10-CM | POA: Diagnosis not present

## 2022-07-23 ENCOUNTER — Telehealth: Payer: Self-pay | Admitting: Family Medicine

## 2022-07-23 NOTE — Telephone Encounter (Signed)
error 

## 2022-07-24 NOTE — Telephone Encounter (Signed)
LMOM informing patient Testopel has been denied.

## 2022-08-02 DIAGNOSIS — C20 Malignant neoplasm of rectum: Secondary | ICD-10-CM | POA: Diagnosis not present

## 2022-08-02 DIAGNOSIS — Z933 Colostomy status: Secondary | ICD-10-CM | POA: Diagnosis not present

## 2022-09-17 ENCOUNTER — Inpatient Hospital Stay: Payer: BC Managed Care – PPO | Attending: Internal Medicine

## 2022-09-17 DIAGNOSIS — G629 Polyneuropathy, unspecified: Secondary | ICD-10-CM | POA: Insufficient documentation

## 2022-09-17 DIAGNOSIS — Z933 Colostomy status: Secondary | ICD-10-CM | POA: Diagnosis not present

## 2022-09-17 DIAGNOSIS — Z923 Personal history of irradiation: Secondary | ICD-10-CM | POA: Diagnosis not present

## 2022-09-17 DIAGNOSIS — C20 Malignant neoplasm of rectum: Secondary | ICD-10-CM | POA: Insufficient documentation

## 2022-09-17 DIAGNOSIS — Z79899 Other long term (current) drug therapy: Secondary | ICD-10-CM | POA: Insufficient documentation

## 2022-09-17 DIAGNOSIS — Z87891 Personal history of nicotine dependence: Secondary | ICD-10-CM | POA: Diagnosis not present

## 2022-09-17 DIAGNOSIS — Z9221 Personal history of antineoplastic chemotherapy: Secondary | ICD-10-CM | POA: Diagnosis not present

## 2022-09-17 DIAGNOSIS — Z95828 Presence of other vascular implants and grafts: Secondary | ICD-10-CM

## 2022-09-17 LAB — CBC WITH DIFFERENTIAL (CANCER CENTER ONLY)
Abs Immature Granulocytes: 0.01 10*3/uL (ref 0.00–0.07)
Basophils Absolute: 0.1 10*3/uL (ref 0.0–0.1)
Basophils Relative: 1 %
Eosinophils Absolute: 0.1 10*3/uL (ref 0.0–0.5)
Eosinophils Relative: 2 %
HCT: 40.8 % (ref 39.0–52.0)
Hemoglobin: 13.7 g/dL (ref 13.0–17.0)
Immature Granulocytes: 0 %
Lymphocytes Relative: 26 %
Lymphs Abs: 1.3 10*3/uL (ref 0.7–4.0)
MCH: 29.3 pg (ref 26.0–34.0)
MCHC: 33.6 g/dL (ref 30.0–36.0)
MCV: 87.2 fL (ref 80.0–100.0)
Monocytes Absolute: 0.6 10*3/uL (ref 0.1–1.0)
Monocytes Relative: 13 %
Neutro Abs: 2.9 10*3/uL (ref 1.7–7.7)
Neutrophils Relative %: 58 %
Platelet Count: 216 10*3/uL (ref 150–400)
RBC: 4.68 MIL/uL (ref 4.22–5.81)
RDW: 12.2 % (ref 11.5–15.5)
WBC Count: 5 10*3/uL (ref 4.0–10.5)
nRBC: 0 % (ref 0.0–0.2)

## 2022-09-17 LAB — CMP (CANCER CENTER ONLY)
ALT: 32 U/L (ref 0–44)
AST: 33 U/L (ref 15–41)
Albumin: 4.3 g/dL (ref 3.5–5.0)
Alkaline Phosphatase: 81 U/L (ref 38–126)
Anion gap: 8 (ref 5–15)
BUN: 19 mg/dL (ref 6–20)
CO2: 24 mmol/L (ref 22–32)
Calcium: 8.6 mg/dL — ABNORMAL LOW (ref 8.9–10.3)
Chloride: 103 mmol/L (ref 98–111)
Creatinine: 1.08 mg/dL (ref 0.61–1.24)
GFR, Estimated: >60 mL/min (ref >60)
Glucose, Bld: 110 mg/dL — ABNORMAL HIGH (ref 70–99)
Potassium: 3.7 mmol/L (ref 3.5–5.1)
Sodium: 135 mmol/L (ref 135–145)
Total Bilirubin: 0.7 mg/dL (ref 0.3–1.2)
Total Protein: 7.9 g/dL (ref 6.5–8.1)

## 2022-09-17 MED ORDER — HEPARIN SOD (PORK) LOCK FLUSH 100 UNIT/ML IV SOLN
500.0000 [IU] | Freq: Once | INTRAVENOUS | Status: AC
  Administered 2022-09-17: 500 [IU] via INTRAVENOUS
  Filled 2022-09-17: qty 5

## 2022-09-17 MED ORDER — SODIUM CHLORIDE 0.9% FLUSH
10.0000 mL | INTRAVENOUS | Status: DC | PRN
  Administered 2022-09-17: 10 mL via INTRAVENOUS
  Filled 2022-09-17: qty 10

## 2022-09-17 NOTE — Patient Instructions (Signed)

## 2022-09-19 ENCOUNTER — Ambulatory Visit
Admission: RE | Admit: 2022-09-19 | Discharge: 2022-09-19 | Disposition: A | Discharge status: Home / Self Care | Payer: BC Managed Care – PPO | Source: Ambulatory Visit | Attending: Internal Medicine | Admitting: Internal Medicine

## 2022-09-19 DIAGNOSIS — R911 Solitary pulmonary nodule: Secondary | ICD-10-CM | POA: Diagnosis not present

## 2022-09-19 DIAGNOSIS — J181 Lobar pneumonia, unspecified organism: Secondary | ICD-10-CM | POA: Diagnosis not present

## 2022-09-19 DIAGNOSIS — C20 Malignant neoplasm of rectum: Secondary | ICD-10-CM | POA: Diagnosis not present

## 2022-09-19 DIAGNOSIS — I700 Atherosclerosis of aorta: Secondary | ICD-10-CM | POA: Diagnosis not present

## 2022-09-19 DIAGNOSIS — Z933 Colostomy status: Secondary | ICD-10-CM | POA: Diagnosis not present

## 2022-09-19 DIAGNOSIS — Z85048 Personal history of other malignant neoplasm of rectum, rectosigmoid junction, and anus: Secondary | ICD-10-CM | POA: Diagnosis not present

## 2022-09-19 LAB — CEA: CEA: 1.7 ng/mL (ref 0.0–4.7)

## 2022-09-19 MED ORDER — IOHEXOL 300 MG/ML  SOLN
100.0000 mL | Freq: Once | INTRAMUSCULAR | Status: AC | PRN
  Administered 2022-09-19: 100 mL via INTRAVENOUS

## 2022-09-25 ENCOUNTER — Encounter: Payer: Self-pay | Admitting: Internal Medicine

## 2022-09-25 ENCOUNTER — Inpatient Hospital Stay (HOSPITAL_BASED_OUTPATIENT_CLINIC_OR_DEPARTMENT_OTHER): Payer: BC Managed Care – PPO | Admitting: Internal Medicine

## 2022-09-25 VITALS — BP 140/81 | HR 79 | Temp 96.6°F | Resp 18 | Wt 322.6 lb

## 2022-09-25 DIAGNOSIS — G629 Polyneuropathy, unspecified: Secondary | ICD-10-CM | POA: Diagnosis not present

## 2022-09-25 DIAGNOSIS — R918 Other nonspecific abnormal finding of lung field: Secondary | ICD-10-CM

## 2022-09-25 DIAGNOSIS — Z9221 Personal history of antineoplastic chemotherapy: Secondary | ICD-10-CM | POA: Diagnosis not present

## 2022-09-25 DIAGNOSIS — C20 Malignant neoplasm of rectum: Secondary | ICD-10-CM | POA: Diagnosis not present

## 2022-09-25 DIAGNOSIS — Z923 Personal history of irradiation: Secondary | ICD-10-CM | POA: Diagnosis not present

## 2022-09-25 DIAGNOSIS — Z87891 Personal history of nicotine dependence: Secondary | ICD-10-CM | POA: Diagnosis not present

## 2022-09-25 DIAGNOSIS — Z933 Colostomy status: Secondary | ICD-10-CM | POA: Diagnosis not present

## 2022-09-25 DIAGNOSIS — Z79899 Other long term (current) drug therapy: Secondary | ICD-10-CM | POA: Diagnosis not present

## 2022-09-25 MED ORDER — LEVOFLOXACIN 500 MG PO TABS: 250.0000 mg | ORAL_TABLET | Freq: Every day | ORAL | 0 refills | Status: AC

## 2022-09-25 NOTE — Progress Notes (Signed)
Patient having new increasing SOBr after recent URI a few weeks ago.  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

## 2022-09-25 NOTE — Progress Notes (Signed)
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 CEA- 87. CT C/A/P- Large mass involving the rectum is identified compatible with primary rectal carcinoma. This has a large, 6.7 cm exophytic component in the left pelvis. Multiple perirectal lymph nodes are identified compatible with metastatic adenopathy; Borderline enlarged aortocaval node measures 0.9 cm; PET-bulky rectal mass; multiple lymph nodes noted; no uptake in the retroperitoneal lymph nodes; MRI rectum- T3N2M0;   Pretreatment CEA 94; TNT  # June 1st 2021- FOLFOX T3N2M0;   Pretreatment CEA 94.  S/p TNT [total neoadjuvant therapy]; currently s/p FOLFOX chemotherapy. CEA- pre-treatment- ~100; CEA- 2.0; also s/p 5FU- CIV M-F; RT [ 9/20-10/25]. JAN 8th, 2022- APR-COMPLETE pathologic response. [Dr.Sadiq; [facility]]  # # AUG 2022- COLON POLYP [Dr.Anna]  TUBULAR ADENOMA; - NEGATIVE FOR HIGH-GRADE DYSPLASIA AND MALIGNANCY. Repeat colo in AUG 2022 [again 3 years]  # SURVIVORSHIP:   # GENETICS:   DIAGNOSIS: RECTAL CA  STAGE:   III     ;  GOALS: cure  CURRENT/MOST RECENT THERAPY : Surveillance   Rectal cancer ([place])  12/20/2019 Initial Diagnosis   Rectal cancer ([place])   01/04/2020 - 04/11/2020 Chemotherapy   The patient had dexamethasone (DECADRON)  4 MG tablet, 8 mg, Oral, Daily, 1 of 1 cycle, Start date: --, End date: -- palonosetron (ALOXI) injection 0.25 mg, 0.25 mg, Intravenous,  Once, 8 of 8 cycles Administration: 0.25 mg (01/04/2020), 0.25 mg (01/17/2020), 0.25 mg (01/31/2020), 0.25 mg (02/14/2020), 0.25 mg (02/28/2020), 0.25 mg (03/13/2020), 0.25 mg (04/11/2020) pegfilgrastim (NEULASTA ONPRO KIT) injection 6 mg, 6 mg, Subcutaneous, Once, 2 of 2 cycles Administration: 6 mg (02/02/2020) leucovorin 1,000 mg in dextrose 5 % 250 mL infusion, 1,008 mg, Intravenous,  Once, 8 of 8 cycles Administration: 1,000 mg (01/04/2020), 1,000 mg (01/17/2020), 1,000 mg (01/31/2020), 1,000 mg (02/14/2020), 1,000 mg (02/28/2020), 1,000 mg (03/13/2020), 1,000 mg (04/11/2020) oxaliplatin (ELOXATIN) 200 mg in dextrose 5 % 500 mL chemo infusion, 215 mg, Intravenous,  Once, 8 of 8 cycles Administration: 200 mg (01/04/2020), 200 mg (01/17/2020), 200 mg (01/31/2020), 200 mg (02/14/2020), 200 mg (02/28/2020), 200 mg (03/13/2020), 200 mg (04/11/2020) fluorouracil (ADRUCIL) chemo injection 1,000 mg, 400 mg/m2 = 1,000 mg, Intravenous,  Once, 2 of 2 cycles Administration: 1,000 mg (01/04/2020), 1,000 mg (01/17/2020) fluorouracil (ADRUCIL) 6,050 mg in sodium chloride 0.9 % 129 mL chemo infusion, 2,400 mg/m2 = 6,050 mg, Intravenous, 1 Day/Dose, 8 of 8 cycles Administration: 6,050 mg (01/04/2020), 6,050 mg (01/17/2020), 6,050 mg (01/31/2020), 6,050 mg (02/14/2020), 6,050 mg (02/28/2020), 6,050 mg (03/13/2020), 6,050 mg (04/11/2020)  for chemotherapy treatment.    04/24/2020 - 05/26/2020 Chemotherapy   Patient is on Treatment Plan : RECTAL 5FU IVCI D1-5 + XRT        HISTORY OF PRESENTING ILLNESS: Ambulating independently.  Alone.  Stephen Vasquez 62 y.o.  male  With stage III rectal cancer currently s/p TNT-FOLFOX chemotherapy & s/p chemoradiation May 29, 2020-s/p surgery at [facility] in January 2022-is here today with results of  the surveillance CT scan.  Patient having new increasing SOBr after recent URI a few  weeks ago. No fever no chills.   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 Denies any significant worssening of  tingling or numbness.   No nausea no vomiting. Colostomy working well.   Review of Systems  Constitutional:  Positive for malaise/fatigue. Negative for chills, diaphoresis, fever and weight loss.  HENT:  Negative for nosebleeds and sore throat.   Eyes:  Negative for double vision.  Respiratory:  Negative for cough, hemoptysis, sputum production, shortness of breath and wheezing.   Cardiovascular:  Negative for chest pain, palpitations, orthopnea and leg swelling.  Gastrointestinal:  Negative for abdominal pain, constipation, diarrhea, heartburn, melena, nausea and vomiting.  Genitourinary:  Negative for dysuria, frequency and urgency.  Musculoskeletal:  Negative for back pain.  Skin: Negative.  Negative for itching and rash.  Neurological:  Negative for dizziness, focal weakness, weakness and headaches.  Endo/Heme/Allergies:  Does not bruise/bleed easily.  Psychiatric/Behavioral:  Negative for depression. The patient is not nervous/anxious and does not have insomnia.      MEDICAL HISTORY:  Past Medical History:  Diagnosis Date   Allergic rhinitis    Heart murmur    Rectal cancer ([place])     SURGICAL HISTORY: Past Surgical History:  Procedure Laterality Date   COLONOSCOPY WITH PROPOFOL N/A 12/15/2019   Procedure: COLONOSCOPY WITH PROPOFOL;  Surgeon: [name], MD;  Location: [facility] ENDOSCOPY;  Service: Gastroenterology;  Laterality: N/A;   COLONOSCOPY WITH PROPOFOL N/A 03/21/2021   Procedure: COLONOSCOPY WITH PROPOFOL;  Surgeon: [name], MD;  Location: [facility] ENDOSCOPY;  Service: Gastroenterology;  Laterality: N/A;   COLONOSCOPY WITH PROPOFOL N/A 03/22/2021   Procedure: COLONOSCOPY WITH PROPOFOL;  Surgeon: [name], MD;  Location: [facility] ENDOSCOPY;  Service: Gastroenterology;  Laterality: N/A;   COLOSTOMY  08/2020   PORTACATH PLACEMENT Left 12/29/2019   Procedure: INSERTION  PORT-A-CATH;  Surgeon: Robert Bellow, MD;  Location: [facility] ORS;  Service: General;  Laterality: Left;    SOCIAL HISTORY: Social History   Socioeconomic History   Marital status: Married    Spouse name: Not on file   Number of children: Not on file   Years of education: Not on file   Highest education level: Not on file  Occupational History   Not on file  Tobacco Use   Smoking status: Former    Passive exposure: Past   Smokeless tobacco: Never  Vaping Use   Vaping Use: Never used  Substance and Sexual Activity   Alcohol use: Not Currently   Drug use: Never   Sexual activity: Yes  Other Topics Concern   Not on file  Social History Narrative   Lives in [place]; Freight forwarder at [employer]; 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 2 children- in boy and girls in late 39s.    Social Determinants of Health   Financial Resource Strain: Not on file  Food Insecurity: Not on file  Transportation Needs: Not on file  Physical Activity: Not on file  Stress: Not on file  Social Connections: Not on file  Intimate Partner Violence: Not on file    FAMILY HISTORY: Family History  Problem Relation Age of Onset   Hypertension Mother    Hypertension Father    Stomach cancer Father        in 22s- survived.    Heart disease Brother     ALLERGIES:  has No Known Allergies.  MEDICATIONS:  Current Outpatient Medications  Medication Sig Dispense Refill  Acetaminophen (TYLENOL EXTRA STRENGTH PO) Take 1 tablet by mouth every 8 (eight) hours as needed (pain).     azelastine (ASTELIN) 0.1 % nasal spray Place 2 sprays into both nostrils 2 (two) times daily. Use in each nostril as directed 30 mL 12   levofloxacin (LEVAQUIN) 500 MG tablet Take 0.5 tablets (250 mg total) by mouth daily. 5 tablet 0   lidocaine-prilocaine (EMLA) cream Apply 1 application topically as needed (port access). 30 g 3   montelukast (SINGULAIR) 10 MG tablet Take 1 tablet (10 mg total) by mouth at bedtime. 30 tablet 3    oxymetazoline (AFRIN) 0.05 % nasal spray Place 1 spray into both nostrils 2 (two) times daily as needed for congestion.     tadalafil (CIALIS) 5 MG tablet Take 1-2 tablets (5-10 mg total) by mouth daily. 60 tablet 11   Testosterone 20.25 MG/1.25GM (1.62%) GEL Place 3 Act onto the skin daily. 230 g 2   azithromycin (ZITHROMAX) 250 MG tablet Take 2 on day 1; and then 1 pill once a day. (Patient not taking: Reported on 09/25/2022) 6 each 0   Sod Picosulfate-Mag Ox-Cit Acd (CLENPIQ) 10-3.5-12 MG-GM -GM/160ML SOLN Take 1 bottle at 5 PM followed by five 8 oz cups of water and repeat 5 hours before procedure. (Patient not taking: Reported on 01/17/2022) 320 mL 0   No current facility-administered medications for this visit.      Marland Kitchen  PHYSICAL EXAMINATION: ECOG PERFORMANCE STATUS: 1 - Symptomatic but completely ambulatory  Vitals:   09/25/22 1300  BP: (!) 140/81  Pulse: 79  Resp: 18  Temp: (!) 96.6 F (35.9 C)  SpO2: 98%   Filed Weights   09/25/22 1300  Weight: (!) 322 lb 9.6 oz (146.3 kg)    Physical Exam Constitutional:      Comments: He is alone.   Walk independently.  HENT:     Head: Normocephalic and atraumatic.     Mouth/Throat:     Pharynx: No oropharyngeal exudate.  Eyes:     Pupils: Pupils are equal, round, and reactive to light.  Cardiovascular:     Rate and Rhythm: Normal rate and regular rhythm.  Pulmonary:     Effort: Pulmonary effort is normal. No respiratory distress.     Breath sounds: Normal breath sounds. No wheezing.  Abdominal:     General: Bowel sounds are normal. There is no distension.     Palpations: Abdomen is soft. There is no mass.     Tenderness: There is no abdominal tenderness. There is no guarding or rebound.     Comments: Positive for colostomy.  Musculoskeletal:        General: No tenderness. Normal range of motion.     Cervical back: Normal range of motion and neck supple.  Skin:    General: Skin is warm.  Neurological:     Mental Status: He  is alert and oriented to person, place, and time.  Psychiatric:        Mood and Affect: Affect normal.      LABORATORY DATA:  I have reviewed the data as listed Lab Results  Component Value Date   WBC 5.0 09/17/2022   HGB 13.7 09/17/2022   HCT 40.8 09/17/2022   MCV 87.2 09/17/2022   PLT 216 09/17/2022   Recent Labs    03/18/22 0934 09/17/22 1306  NA 136 135  K 3.4* 3.7  CL 100 103  CO2 25 24  GLUCOSE 167* 110*  BUN 24* 19  CREATININE 1.28* 1.08  CALCIUM 9.0 8.6*  GFRNONAA >60 >60  PROT 7.6 7.9  ALBUMIN 4.0 4.3  AST 25 33  ALT 17 32  ALKPHOS 76 81  BILITOT 0.4 0.7    RADIOGRAPHIC STUDIES: I have personally reviewed the radiological images as listed and agreed with the findings in the report. CT CHEST ABDOMEN PELVIS W CONTRAST  Result Date: 09/19/2022 CLINICAL DATA:  History of rectal cancer, monitor. * Tracking Code: BO * EXAM: CT CHEST, ABDOMEN, AND PELVIS WITH CONTRAST TECHNIQUE: Multidetector CT imaging of the chest, abdomen and pelvis was performed following the standard protocol during bolus administration of intravenous contrast. RADIATION DOSE REDUCTION: This exam was performed according to the departmental dose-optimization program which includes automated exposure control, adjustment of the mA and/or kV according to patient size and/or use of iterative reconstruction technique. CONTRAST:  175m OMNIPAQUE IOHEXOL 300 MG/ML  SOLN COMPARISON:  Multiple priors including most recent CT March 27, 2022. FINDINGS: CT CHEST FINDINGS Cardiovascular: Left chest Port-A-Cath with tip at the superior cavoatrial junction. Aortic atherosclerosis. No central pulmonary embolus on this nondedicated study. Normal size heart. No significant pericardial effusion/thickening. Mediastinum/Nodes: No supraclavicular adenopathy. No suspicious thyroid nodule. No pathologically enlarged mediastinal, hilar or axillary lymph nodes. The esophagus is grossly unremarkable. Lungs/Pleura: Resolution of  the peripheral ground-glass nodule in the right upper lobe. New central consolidation measuring 2.7 cm on image 113/4 with peripheral rim of ground-glass in the anterior right lower lobe. No pleural effusion.  No pneumothorax. Musculoskeletal: No aggressive lytic or blastic lesion of bone CT ABDOMEN PELVIS FINDINGS Hepatobiliary: No suspicious hepatic lesion. Gallbladder is unremarkable. No biliary ductal dilation. Pancreas: No pancreatic ductal dilation or evidence of acute inflammation. Spleen: No splenomegaly. Adrenals/Urinary Tract: Bilateral adrenal glands appear normal. No hydronephrosis. Kidneys demonstrate symmetric enhancement and excretion. Urinary bladder is unremarkable for degree of distension. Stomach/Bowel: Stomach is nondistended limiting evaluation. Radiopaque enteric contrast material traverses the ascending colon. No pathologic dilation of small or large bowel. Prior partial left hemicolectomy with end colostomy in the anterior left abdominal wall. Vascular/Lymphatic: Aortic atherosclerosis. Smooth IVC contours. The portal, splenic and superior mesenteric veins are patent. Unchanged size of the indexed 3 mm aortocaval lymph node on image 88/2. No pathologically enlarged abdominal or pelvic lymph nodes. Reproductive: Prostate is unremarkable. Other: Similar mild presacral soft tissue stranding/thickening, favored posttreatment related. No significant abdominopelvic free fluid. No discrete peritoneal or omental nodularity. Musculoskeletal: No aggressive lytic or blastic lesion of bone. IMPRESSION: 1. New central consolidation with peripheral rim of ground-glass in the anterior right lower lobe measuring 2.7 cm with resolution of the previous 8 mm ground-glass opacity, favored to reflect an infectious or inflammatory process. Although while distinctly not favored metastatic disease is not entirely excluded. Suggest attention on short-term interval follow-up CT in 1-2 months. 2. Stable 3 mm aortocaval  lymph node. No new or progressive adenopathy in the chest, abdomen or pelvis. 3. Prior partial left hemicolectomy with end colostomy in the anterior left abdominal wall. Similar mild presacral soft tissue stranding/thickening, favored posttreatment related. 4.  Aortic Atherosclerosis (ICD10-I70.0). Electronically Signed   By: JDahlia BailiffM.D.   On: 09/19/2022 12:53    ASSESSMENT & PLAN:   [place] # Rectal adenocarcinoma- moderate differentiated; stage III [multiple pelvic lymph nodes]; ? borderline 9 mm retroperitoneal LN.  S/P total neoadjuvant chemotherapy/radiation therapy-s/p APR- JAN 2022 [[facility]]- pathologic CR.   # FEB 15th, 2024-no evidence of metastatic disease noted in the CT abdomen pelvis; see below regarding  chest CT scan.  Again low clinical suspicion for any recurrent disease.  However we will get a short-term CT scan in 3 months to confirm resolution.  # # Incidental FEB 15th, 2024- New central consolidation with peripheral rim of ground-glass in the anterior right lower lobe measuring 2.7 cm with resolution of the previous 8 mm ground-glass opacity, favored to reflect an infectious or inflammatory process.  Given the recent upper respiratory symptoms/cough-recommend Levaquin 500 mg once a day for 5 days.  As per the wife given patient's concerns for ongoing cough even prior to this episode-recommend evaluation with pulmonary.  Will make a referral.  # AUG 2022- COLON POLYP [Dr.Anna]  TUBULAR ADENOMA; - NEGATIVE FOR HIGH-GRADE DYSPLASIA AND MALIGNANCY. Repeat colo in AUG 2025 [again 3 years].   #IV access: Mediport in place- stable. Discussed the pros and cons of continued Mediport.  Plan keeping it for now; flush every 3 months.  Will decide explanting off next visit   # [facility] from [facility]-permanent disability parking placard given.  DISPOSITION:  # will refer to Pulmonary Dr.Dgyali chronic re: cough # follow up in 3 months- PORT FLUSH; cbc/cmp/CEA; CT chest-Dr.B   #  I reviewed the blood work- with the patient in detail; also reviewed the imaging independently [as summarized above]; and with the patient in detail.  Also spoke with patient's wife over the phone.  Answered all questions.  All questions were answered. The patient knows to call the clinic with any problems, questions or concerns.    Cammie Sickle, MD 09/25/2022 3:46 PM

## 2022-09-25 NOTE — Assessment & Plan Note (Addendum)
#   Rectal adenocarcinoma- moderate differentiated; stage III [multiple pelvic lymph nodes]; ? borderline 9 mm retroperitoneal LN.  S/P total neoadjuvant chemotherapy/radiation therapy-s/p APR- JAN 2022 [[facility]]- pathologic CR.   # FEB 15th, 2024-no evidence of metastatic disease noted in the CT abdomen pelvis; see below regarding chest CT scan.  Again low clinical suspicion for any recurrent disease.  However we will get a short-term CT scan in 3 months to confirm resolution.  # # Incidental FEB 15th, 2024- New central consolidation with peripheral rim of ground-glass in the anterior right lower lobe measuring 2.7 cm with resolution of the previous 8 mm ground-glass opacity, favored to reflect an infectious or inflammatory process.  Given the recent upper respiratory symptoms/cough-recommend Levaquin 500 mg once a day for 5 days.  As per the wife given patient's concerns for ongoing cough even prior to this episode-recommend evaluation with pulmonary.  Will make a referral.  # AUG 2022- COLON POLYP [Dr.Anna]  TUBULAR ADENOMA; - NEGATIVE FOR HIGH-GRADE DYSPLASIA AND MALIGNANCY. Repeat colo in AUG 2025 [again 3 years].   #IV access: Mediport in place- stable. Discussed the pros and cons of continued Mediport.  Plan keeping it for now; flush every 3 months.  Will decide explanting off next visit   # [facility] from [facility]-permanent disability parking placard given.  DISPOSITION:  # will refer to Pulmonary Dr.Dgyali chronic re: cough # follow up in 3 months- PORT FLUSH; cbc/cmp/CEA; CT chest-Dr.B   # I reviewed the blood work- with the patient in detail; also reviewed the imaging independently [as summarized above]; and with the patient in detail.  Also spoke with patient's wife over the phone.  Answered all questions.

## 2022-10-02 DIAGNOSIS — Z933 Colostomy status: Secondary | ICD-10-CM | POA: Diagnosis not present

## 2022-10-02 DIAGNOSIS — C20 Malignant neoplasm of rectum: Secondary | ICD-10-CM | POA: Diagnosis not present

## 2022-10-10 DIAGNOSIS — R030 Elevated blood-pressure reading, without diagnosis of hypertension: Secondary | ICD-10-CM | POA: Diagnosis not present

## 2022-10-10 DIAGNOSIS — C20 Malignant neoplasm of rectum: Secondary | ICD-10-CM | POA: Diagnosis not present

## 2022-10-10 DIAGNOSIS — E291 Testicular hypofunction: Secondary | ICD-10-CM | POA: Diagnosis not present

## 2022-10-10 DIAGNOSIS — J329 Chronic sinusitis, unspecified: Secondary | ICD-10-CM | POA: Diagnosis not present

## 2022-10-20 DIAGNOSIS — J329 Chronic sinusitis, unspecified: Secondary | ICD-10-CM | POA: Diagnosis not present

## 2022-10-23 DIAGNOSIS — H9193 Unspecified hearing loss, bilateral: Secondary | ICD-10-CM | POA: Diagnosis not present

## 2022-10-30 DIAGNOSIS — Z461 Encounter for fitting and adjustment of hearing aid: Secondary | ICD-10-CM | POA: Diagnosis not present

## 2022-10-31 ENCOUNTER — Encounter: Payer: Self-pay | Admitting: Urology

## 2022-11-05 ENCOUNTER — Ambulatory Visit: Payer: BC Managed Care – PPO | Admitting: Urology

## 2022-11-12 DIAGNOSIS — Z452 Encounter for adjustment and management of vascular access device: Secondary | ICD-10-CM | POA: Diagnosis not present

## 2022-11-19 ENCOUNTER — Ambulatory Visit: Payer: BC Managed Care – PPO | Admitting: Urology

## 2022-12-04 DIAGNOSIS — K9400 Colostomy complication, unspecified: Secondary | ICD-10-CM | POA: Diagnosis not present

## 2022-12-17 DIAGNOSIS — H9313 Tinnitus, bilateral: Secondary | ICD-10-CM | POA: Diagnosis not present

## 2022-12-17 DIAGNOSIS — H9042 Sensorineural hearing loss, unilateral, left ear, with unrestricted hearing on the contralateral side: Secondary | ICD-10-CM | POA: Diagnosis not present

## 2022-12-23 DIAGNOSIS — C20 Malignant neoplasm of rectum: Secondary | ICD-10-CM | POA: Diagnosis not present

## 2022-12-24 ENCOUNTER — Other Ambulatory Visit: Payer: BC Managed Care – PPO

## 2022-12-24 ENCOUNTER — Ambulatory Visit: Payer: BC Managed Care – PPO

## 2022-12-25 ENCOUNTER — Inpatient Hospital Stay: Payer: BC Managed Care – PPO | Attending: Internal Medicine

## 2022-12-25 ENCOUNTER — Inpatient Hospital Stay: Admission: RE | Admit: 2022-12-25 | Payer: BC Managed Care – PPO | Source: Ambulatory Visit

## 2022-12-27 ENCOUNTER — Inpatient Hospital Stay: Payer: BC Managed Care – PPO | Admitting: Internal Medicine

## 2023-01-16 ENCOUNTER — Other Ambulatory Visit: Payer: BC Managed Care – PPO

## 2023-02-03 DIAGNOSIS — H9313 Tinnitus, bilateral: Secondary | ICD-10-CM | POA: Diagnosis not present

## 2023-02-03 DIAGNOSIS — H9042 Sensorineural hearing loss, unilateral, left ear, with unrestricted hearing on the contralateral side: Secondary | ICD-10-CM | POA: Diagnosis not present

## 2023-02-04 DIAGNOSIS — Z933 Colostomy status: Secondary | ICD-10-CM | POA: Diagnosis not present

## 2023-02-04 DIAGNOSIS — E291 Testicular hypofunction: Secondary | ICD-10-CM | POA: Diagnosis not present

## 2023-02-04 DIAGNOSIS — J329 Chronic sinusitis, unspecified: Secondary | ICD-10-CM | POA: Diagnosis not present

## 2023-02-04 DIAGNOSIS — Z85048 Personal history of other malignant neoplasm of rectum, rectosigmoid junction, and anus: Secondary | ICD-10-CM | POA: Diagnosis not present

## 2023-02-11 DIAGNOSIS — I10 Essential (primary) hypertension: Secondary | ICD-10-CM | POA: Diagnosis not present

## 2023-04-17 DIAGNOSIS — E291 Testicular hypofunction: Secondary | ICD-10-CM | POA: Diagnosis not present

## 2023-06-20 DIAGNOSIS — Z01818 Encounter for other preprocedural examination: Secondary | ICD-10-CM | POA: Diagnosis not present

## 2023-06-20 DIAGNOSIS — Z1211 Encounter for screening for malignant neoplasm of colon: Secondary | ICD-10-CM | POA: Diagnosis not present

## 2023-08-20 DIAGNOSIS — J329 Chronic sinusitis, unspecified: Secondary | ICD-10-CM | POA: Diagnosis not present

## 2023-08-20 DIAGNOSIS — E291 Testicular hypofunction: Secondary | ICD-10-CM | POA: Diagnosis not present

## 2023-08-20 DIAGNOSIS — M1812 Unilateral primary osteoarthritis of first carpometacarpal joint, left hand: Secondary | ICD-10-CM | POA: Diagnosis not present

## 2023-08-20 DIAGNOSIS — Z933 Colostomy status: Secondary | ICD-10-CM | POA: Diagnosis not present

## 2023-08-20 DIAGNOSIS — Z85048 Personal history of other malignant neoplasm of rectum, rectosigmoid junction, and anus: Secondary | ICD-10-CM | POA: Diagnosis not present

## 2023-08-20 DIAGNOSIS — Z23 Encounter for immunization: Secondary | ICD-10-CM | POA: Diagnosis not present

## 2023-08-20 DIAGNOSIS — M19012 Primary osteoarthritis, left shoulder: Secondary | ICD-10-CM | POA: Diagnosis not present

## 2023-08-26 DIAGNOSIS — R41841 Cognitive communication deficit: Secondary | ICD-10-CM | POA: Diagnosis not present

## 2023-09-04 DIAGNOSIS — R41841 Cognitive communication deficit: Secondary | ICD-10-CM | POA: Diagnosis not present

## 2023-10-01 DIAGNOSIS — R41841 Cognitive communication deficit: Secondary | ICD-10-CM | POA: Diagnosis not present

## 2023-10-06 DIAGNOSIS — R0981 Nasal congestion: Secondary | ICD-10-CM | POA: Diagnosis not present

## 2023-10-06 DIAGNOSIS — H9042 Sensorineural hearing loss, unilateral, left ear, with unrestricted hearing on the contralateral side: Secondary | ICD-10-CM | POA: Diagnosis not present

## 2023-10-06 DIAGNOSIS — J342 Deviated nasal septum: Secondary | ICD-10-CM | POA: Diagnosis not present

## 2023-10-14 DIAGNOSIS — G4733 Obstructive sleep apnea (adult) (pediatric): Secondary | ICD-10-CM | POA: Diagnosis not present

## 2023-10-14 DIAGNOSIS — R4181 Age-related cognitive decline: Secondary | ICD-10-CM | POA: Diagnosis not present

## 2023-12-08 DIAGNOSIS — J343 Hypertrophy of nasal turbinates: Secondary | ICD-10-CM | POA: Diagnosis not present

## 2023-12-08 DIAGNOSIS — J342 Deviated nasal septum: Secondary | ICD-10-CM | POA: Diagnosis not present

## 2023-12-23 ENCOUNTER — Other Ambulatory Visit: Payer: Self-pay | Admitting: Otolaryngology

## 2023-12-23 DIAGNOSIS — J329 Chronic sinusitis, unspecified: Secondary | ICD-10-CM

## 2023-12-26 ENCOUNTER — Encounter: Payer: Self-pay | Admitting: Internal Medicine

## 2023-12-31 ENCOUNTER — Ambulatory Visit
Admission: RE | Admit: 2023-12-31 | Discharge: 2023-12-31 | Disposition: A | Discharge status: Home / Self Care | Source: Ambulatory Visit | Attending: Otolaryngology | Admitting: Otolaryngology

## 2023-12-31 DIAGNOSIS — J329 Chronic sinusitis, unspecified: Secondary | ICD-10-CM | POA: Diagnosis not present
